# Patient Record
Sex: Male | Born: 1940 | Race: White | Hispanic: No | Marital: Married | Smoking: Former smoker
Health system: Southern US, Community
[De-identification: ages and names within clinical notes are randomized; demographics above are authoritative.]

## PROBLEM LIST (undated history)

## (undated) DIAGNOSIS — I2510 Atherosclerotic heart disease of native coronary artery without angina pectoris: Secondary | ICD-10-CM

## (undated) DIAGNOSIS — I219 Acute myocardial infarction, unspecified: Secondary | ICD-10-CM

## (undated) DIAGNOSIS — C9590 Leukemia, unspecified not having achieved remission: Secondary | ICD-10-CM

## (undated) DIAGNOSIS — E785 Hyperlipidemia, unspecified: Secondary | ICD-10-CM

## (undated) DIAGNOSIS — I10 Essential (primary) hypertension: Secondary | ICD-10-CM

## (undated) DIAGNOSIS — M47817 Spondylosis without myelopathy or radiculopathy, lumbosacral region: Secondary | ICD-10-CM

## (undated) DIAGNOSIS — E119 Type 2 diabetes mellitus without complications: Secondary | ICD-10-CM

## (undated) DIAGNOSIS — M1611 Unilateral primary osteoarthritis, right hip: Secondary | ICD-10-CM

## (undated) HISTORY — DX: Unilateral primary osteoarthritis, right hip: M16.11

## (undated) HISTORY — DX: Type 2 diabetes mellitus without complications: E11.9

## (undated) HISTORY — DX: Spondylosis without myelopathy or radiculopathy, lumbosacral region: M47.817

## (undated) HISTORY — PX: CORONARY ANGIOPLASTY WITH STENT PLACEMENT: SHX49

## (undated) NOTE — Progress Notes (Signed)
Formatting of this note is different from the original.  Images from the original note were not included.      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 He is doing okay. The lateral rectus muscle and paraesthesias have gotten worse since the previous visit. He did a 1 day burst of 120 mg of prednisone 6 days ago. No fevers, reports unintentional weight loss. Reports numbness in forehead that radiates down the left side of his face. Reports some discomfort in his hip but no numbness/tingling anywhere else.    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    PHYSICAL EXAM:   VS: There were no vitals taken for this visit.  ECOG: 2- Ambulatory and  capable of all selfcare; unable to carry out work activities.  Up and about > 50% of waking hrs.  GENERAL: Man who is awake, alert, no acute distress  PYSCH:  Alert and oriented.   HEENT: Anicteric sclerae. PERRL. Moist mucous membranes without lesions. Neck supple.  LYMPH NODES: There is no cervical, axillary, supraclavicular or inguinal lymphadenopathy.   HEART: Regular rate and rhythm, no murmurs, rubs, or gallops.  LUNGS: Clear to auscultation bilaterally, no wheezes or crackles.  ABDOMEN: +BS,  Soft, nondistended, nontender. No hepatosplenomegaly  MSK: Grossly intact ROM.   EXTREMITIES: No edema   NEURO: Paresis of left side of face and unable to adduct to left  SKIN: No rashes or other lesions.   IVAC: PIV    DATA REVIEWED:   Component Latest Ref Rng & Units 09/06/2021   WBC 3.70 - 11.00 k/uL 77.88 (H)   RBC 4.20 - 6.00 m/uL 3.10 (L)   Hemoglobin 13.0 - 17.0 g/dL 40.9 (L)   Hematocrit 81.1 - 51.0 % 33.1 (L)   MCV 80.0 - 100.0 fL 106.8 (H)   MCH 26.0 - 34.0 pg 33.9   MCHC 30.5 - 36.0 g/dL 91.4   RDW-CV 78.2 - 95.6 % 17.9 (H)   Platelet Count 150 - 400 k/uL 174   MPV 9.0 - 12.7 fL 10.5   NRBC /100 WBC 0.0   Absolute nRBC <0.01 k/uL <0.01   Neut% % 5.0   Abs Neut (ANC) 1.45 - 7.50 k/uL 3.89   Lymph% % 93.0   Abs Lymph 1.00 - 4.00 k/uL 72.43 (H)   Mono% % 2.0   Abs Mono <0.87 k/uL 1.56 (H)   Eosin% % 0.0   Abs Eosin <0.46 k/uL 0.00   Baso% % 0.0   Abs Baso <0.11 k/uL 0.00   Platelet Estimate  Adequate  Red Cell Morph  Reviewed: see results of individual morphologies   Anisocytosis  Present   Acanthocyte  Few   Ovalocytes  Few   DTYPE  Manual     Component Latest Ref Rng & Units 09/06/2021   Retic % 0.4 - 2.0 % 1.7   Abs Retic 0.018 - 0.100 M/uL 0.052     Component Latest Ref Rng & Units 09/06/2021   Iron 41 - 186 ug/dL 59   TIBC 161 - 096 ug/dL 045   Transferrin Saturation 15.0 - 57.0 % 17.7     ASSESSMENT AND PLAN:  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     #CLL/SLL  - BM biopsy 2003 confirming CLL/SLL (do not have records). Flow cytometry in Care Everywhere as noted above confirms diagnosis  - FISH in 2021 with 13q deletion, IVIG mutated  - Labs show progressive lymphocytosis and anemia  - May need to consider treatment in the future if anemia worsens     # Anemia  - Multifactorial, secondary to CLL as well as iron and folate deficiency  - Iron studies improved today, repeat folic acid in process  - May need to consider CLL directed therapy  soon    #Immunodeficiency/hypogammaglobulinemia  - Secondary to CLL causing severe hypogammaglobulinemia  - IgG 212 on 4/17  - Proceed with IVIG today     # Ramsay Hunt Syndrome  - He has diagnosed himself. MRI did show enhancement of the geniculate ganglion in December.  - Has not had further workup or consultation   - ?CLL related especially in light progressive CLL and timing  - Progressive neurologic symptoms today, we therefore discussed admission to the hospital for urgent evaluation including repeat MRI, ID consult and neurology consult    By signing my name below, I, [facility], attest that this documentation has been prepared under the direction and in the presence of Dr. Liliane Shi  Electronically signed,  Mcneil Sober, Scribe  Sep 06, 2021  11:28 AM    I, Lou Miner, MD, personally performed the services described in this documentation. All medical record entries made by the scribe were at my direction and in my presence. I have reviewed the chart and agree that the record reflects my personal performance and is accurate and complete.    Madlyn Frankel. Liliane Shi, MD  Associate Staff Physician  [facility]   [address]  [place], [place] [address]  Pager: [phone]  Phone: [phone]  Fax: [phone]    Electronically signed by Lou Miner, MD at 09/11/2021  3:49 PM EDT

## (undated) NOTE — Telephone Encounter (Signed)
Formatting of this note might be different from the original.  Call attempt made by [facility] Nurse, patient was reached.    Electronically signed by Quincy Simmonds, RN at 12/07/2021 11:35 AM EDT

---

## 2001-06-25 ENCOUNTER — Ambulatory Visit (HOSPITAL_COMMUNITY): Admission: RE | Admit: 2001-06-25 | Discharge: 2001-06-25 | Payer: Self-pay | Admitting: Family Medicine

## 2001-06-25 ENCOUNTER — Encounter: Payer: Self-pay | Admitting: Family Medicine

## 2001-11-12 ENCOUNTER — Encounter: Payer: Self-pay | Admitting: Cardiology

## 2008-03-03 ENCOUNTER — Ambulatory Visit (HOSPITAL_COMMUNITY): Admission: RE | Admit: 2008-03-03 | Discharge: 2008-03-03 | Payer: Self-pay | Admitting: Family Medicine

## 2010-05-27 ENCOUNTER — Encounter: Payer: Self-pay | Admitting: Family Medicine

## 2012-08-06 NOTE — Patient Instructions (Addendum)
MARKOS THEIL  08/06/2012   Your procedure is scheduled on:  08/10/12  [facility] to [facility] [place] [facility].  Call this number if you have problems the morning of surgery: [phone]   Remember:   Do not eat food or drink liquids after midnight.   Take these medicines the morning of surgery with A SIP OF WATER:verapamil, lisinopril, atenolol   Do not wear jewelry, make-up or nail polish.  Do not wear lotions, powders, or perfumes. You may wear deodorant.  Do not shave 48 hours prior to surgery. Men may shave face and neck.  Do not bring valuables to the hospital.  Contacts, dentures or bridgework may not be worn into surgery.  Leave suitcase in the car. After surgery it may be brought to your room.  For patients admitted to the hospital, checkout time is 11:00 AM the day of  discharge.   Patients discharged the day of surgery will not be allowed to drive  home.  Name and phone number of your driver: family  Special Instructions: Shower using CHG 2 nights before surgery and the night before surgery.  If you shower the day of surgery use CHG.  Use special wash - you have one bottle of CHG for all showers.  You should use approximately 1/3 of the bottle for each shower.   Please read over the following fact sheets that you were given: Pain Booklet, MRSA Information, Surgical Site Infection Prevention, Anesthesia Post-op Instructions and Care and Recovery After Surgery   PATIENT INSTRUCTIONS POST-ANESTHESIA  IMMEDIATELY FOLLOWING SURGERY:  Do not drive or operate machinery for the first twenty four hours after surgery.  Do not make any important decisions for twenty four hours after surgery or while taking narcotic pain medications or sedatives.  If you develop intractable nausea and vomiting or a severe headache please notify your doctor immediately.  FOLLOW-UP:  Please make an appointment with your surgeon as instructed. You do not need to follow up with anesthesia unless specifically instructed  to do so.  WOUND CARE INSTRUCTIONS (if applicable):  Keep a dry clean dressing on the anesthesia/puncture wound site if there is drainage.  Once the wound has quit draining you may leave it open to air.  Generally you should leave the bandage intact for twenty four hours unless there is drainage.  If the epidural site drains for more than 36-48 hours please call the anesthesia department.  QUESTIONS?:  Please feel free to call your physician or the hospital operator if you have any questions, and they will be happy to assist you.      Inguinal Hernia, Adult Muscles help keep everything in the body in its proper place. But if a weak spot in the muscles develops, something can poke through. That is called a hernia. When this happens in the lower part of the belly (abdomen), it is called an inguinal hernia. (It takes its name from a part of the body in this region called the inguinal canal.) A weak spot in the wall of muscles lets some fat or part of the small intestine bulge through. An inguinal hernia can develop at any age. Men get them more often than women. CAUSES  In adults, an inguinal hernia develops over time.  It can be triggered by:  Suddenly straining the muscles of the lower abdomen.  Lifting heavy objects.  Straining to have a bowel movement. Difficult bowel movements (constipation) can lead to this.  Constant coughing. This may be caused by  smoking or lung disease.  Being overweight.  Being pregnant.  Working at a job that requires long periods of standing or heavy lifting.  Having had an inguinal hernia before. One type can be an emergency situation. It is called a strangulated inguinal hernia. It develops if part of the small intestine slips through the weak spot and cannot get back into the abdomen. The blood supply can be cut off. If that happens, part of the intestine may die. This situation requires emergency surgery. SYMPTOMS  Often, a small inguinal hernia has no  symptoms. It is found when a healthcare provider does a physical exam. Larger hernias usually have symptoms.   In adults, symptoms may include:  A lump in the groin. This is easier to see when the person is standing. It might disappear when lying down.  In men, a lump in the scrotum.  Pain or burning in the groin. This occurs especially when lifting, straining or coughing.  A dull ache or feeling of pressure in the groin.  Signs of a strangulated hernia can include:  A bulge in the groin that becomes very painful and tender to the touch.  A bulge that turns red or purple.  Fever, nausea and vomiting.  Inability to have a bowel movement or to pass gas. DIAGNOSIS  To decide if you have an inguinal hernia, a healthcare provider will probably do a physical examination.  This will include asking questions about any symptoms you have noticed.  The healthcare provider might feel the groin area and ask you to cough. If an inguinal hernia is felt, the healthcare provider may try to slide it back into the abdomen.  Usually no other tests are needed. TREATMENT  Treatments can vary. The size of the hernia makes a difference. Options include:  Watchful waiting. This is often suggested if the hernia is small and you have had no symptoms.  No medical procedure will be done unless symptoms develop.  You will need to watch closely for symptoms. If any occur, contact your healthcare provider right away.  Surgery. This is used if the hernia is larger or you have symptoms.  Open surgery. This is usually an outpatient procedure (you will not stay overnight in a hospital). An cut (incision) is made through the skin in the groin. The hernia is put back inside the abdomen. The weak area in the muscles is then repaired by herniorrhaphy or hernioplasty. Herniorrhaphy: in this type of surgery, the weak muscles are sewn back together. Hernioplasty: a patch or mesh is used to close the weak area in the  abdominal wall.  Laparoscopy. In this procedure, a surgeon makes small incisions. A thin tube with a tiny video camera (called a laparoscope) is put into the abdomen. The surgeon repairs the hernia with mesh by looking with the video camera and using two long instruments. HOME CARE INSTRUCTIONS   After surgery to repair an inguinal hernia:  You will need to take pain medicine prescribed by your healthcare provider. Follow all directions carefully.  You will need to take care of the wound from the incision.  Your activity will be restricted for awhile. This will probably include no heavy lifting for several weeks. You also should not do anything too active for a few weeks. When you can return to work will depend on the type of job that you have.  During "watchful waiting" periods, you should:  Maintain a healthy weight.  Eat a diet high in fiber (fruits, vegetables  and whole grains).  Drink plenty of fluids to avoid constipation. This means drinking enough water and other liquids to keep your urine clear or pale yellow.  Do not lift heavy objects.  Do not stand for long periods of time.  Quit smoking. This should keep you from developing a frequent cough. SEEK MEDICAL CARE IF:   A bulge develops in your groin area.  You feel pain, a burning sensation or pressure in the groin. This might be worse if you are lifting or straining.  You develop a fever of more than 100.5 F (38.1 C). SEEK IMMEDIATE MEDICAL CARE IF:   Pain in the groin increases suddenly.  A bulge in the groin gets bigger suddenly and does not go down.  For men, there is sudden pain in the scrotum. Or, the size of the scrotum increases.  A bulge in the groin area becomes red or purple and is painful to touch.  You have nausea or vomiting that does not go away.  You feel your heart beating much faster than normal.  You cannot have a bowel movement or pass gas.  You develop a fever of more than 102.0 F  (38.9 C). Document Released: 09/08/2008 Document Revised: 07/15/2011 Document Reviewed: 09/08/2008 [facility] Patient Information 2013 [place], [place].

## 2012-08-06 NOTE — Consult Note (Signed)
NAME:  Mark Cross, Mark Cross NO.:  [id number]  MEDICAL RECORD NO.:  [id number]  LOCATION:                                 FACILITY:  PHYSICIAN:  Barbaraann Barthel, M.D. DATE OF BIRTH:  1940/06/01  DATE OF CONSULTATION:  08/05/2012 DATE OF DISCHARGE:                                CONSULTATION   NOTE:  Surgery was asked to see this 72 year old white male for bilateral inguinal hernia repair.  In essence, he has had these hernias for quite some time easily over greater than a year.  They have increased in size and caused him considerably more discomfort.  The hernia on the left side is really quite large and the hernia on the right side is moderate size, but rather large as well.  PAST MEDICAL HISTORY:  The patient is a type 2 diabetic, has history of hypertension and hypercholesterolemia.  He has had no previous surgery.  MEDICATIONS:  See medication list.  ALLERGIES:  He has no known allergies.  SOCIAL HISTORY:  He is a nonsmoker and nondrinker.  He used to work in a Medical laboratory scientific officer for many years.  PHYSICAL EXAMINATION:  VITAL SIGNS:  He is 5 feet 8 inches, weighs 157 pounds.  His temperature is 98.5, pulse rate is 70, and his respirations are 16 per minute, blood pressure 175/89. HEENT:  Head is normocephalic.  Eyes, extraocular movements are intact. Pupils were round and reactive to light and accommodation.  There is no conjunctive pallor or scleral injection.  Nose and oral mucosa are moist.  The patient has dental prosthesis superiorly and inferiorly. There are no bruits auscultated. NECK:  Supple and cylindrical without jugular vein distention, thyromegaly, or tracheal deviation, or any suspicious adenopathy. CHEST:  Clear. HEART:  Regular rhythm. ABDOMEN:  Somewhat globus.  No visceromegaly.  Abdomen is soft.  Bowel sounds are normoactive.  The patient has as mentioned a large left inguinal hernia and a moderate size and actually quite large right inguinal  hernia as well. RECTAL:  Stool is guaiac-negative.  The patient has a very firm prostate.  This is being followed by Dr. Sudie Bailey.  The patient states and he has recently had a PSA level. EXTREMITIES:  Within normal limits.  REVIEW OF SYSTEMS:  NEURO SYSTEM:  Grossly intact without any lateralizing symptoms.  No history of migraines or seizures.  ENDOCRINE HISTORY:  Diabetes type 2, no history of thyroid disease. CARDIOPULMONARY SYSTEM:  Nonsmoker, history of hypertension, and hypercholesterolemia. MUSCULOSKELETAL SYSTEM:  Normal for age with some arthritic complaints.  SKIN INTEGUMENT:  The patient has some signs of psoriasis, particularly intragluteal and on the elbows and scalp area and in the knees area.  GI SYSTEM:  No history of hepatitis, constipation, diarrhea, bright red rectal bleeding, black tarry stools, or history of inflammatory bowel disease, or irritable bowel syndrome, and no history of unexplained weight loss.  He is 72 years old and never had a colonoscopy.  GU SYSTEM:  Has past history of kidney stones.  No dysuria or frequency at present, and as stated, a firm prostate on examination.  REVIEW OF HISTORY AND PHYSICAL:  Therefore, Mr. Cossey is a 72 year old  man with bilateral inguinal hernias.  We will certainly repair of the left inguinal hernia and if this goes well, depending upon the type of anesthesia and after consultation with anesthesia, I made attempted to repair both of these at the same sitting.  However, I may only repair the left side first and have him returned.  This was discussed in detail with him along with complications not limited to, but including bleeding, infection, and recurrence.  I also addressed the importance of following up with a colonoscopy and following up his prostate status with him and he says he will do this with Dr. Sudie Bailey.     Barbaraann Barthel, M.D.     WB/MEDQ  D:  08/05/2012  T:  08/06/2012  Job:  [id number]  cc:    Mila Homer. Sudie Bailey, M.D. Fax: [phone]

## 2012-08-07 ENCOUNTER — Encounter (HOSPITAL_COMMUNITY)
Admission: RE | Admit: 2012-08-07 | Discharge: 2012-08-07 | Disposition: A | Discharge status: Home / Self Care | Payer: 59 | Source: Ambulatory Visit | Attending: General Surgery | Admitting: General Surgery

## 2012-08-07 ENCOUNTER — Encounter (HOSPITAL_COMMUNITY): Payer: Self-pay

## 2012-08-07 ENCOUNTER — Other Ambulatory Visit: Payer: Self-pay

## 2012-08-07 HISTORY — DX: Essential (primary) hypertension: I10

## 2012-08-07 HISTORY — DX: Type 2 diabetes mellitus without complications: E11.9

## 2012-08-07 HISTORY — DX: Hyperlipidemia, unspecified: E78.5

## 2012-08-07 LAB — DIFFERENTIAL
Basophils Absolute: 0 10*3/uL (ref 0.0–0.1)
Basophils Relative: 0 % (ref 0–1)
Lymphocytes Relative: 26 % (ref 12–46)
Neutro Abs: 4.9 10*3/uL (ref 1.7–7.7)
Neutrophils Relative %: 62 % (ref 43–77)

## 2012-08-07 LAB — CBC
HCT: 42.8 % (ref 39.0–52.0)
Hemoglobin: 14.7 g/dL (ref 13.0–17.0)
MCHC: 34.3 g/dL (ref 30.0–36.0)
RDW: 12.7 % (ref 11.5–15.5)
WBC: 7.8 10*3/uL (ref 4.0–10.5)

## 2012-08-07 LAB — BASIC METABOLIC PANEL
BUN: 22 mg/dL (ref 6–23)
Chloride: 101 mEq/L (ref 96–112)
Creatinine, Ser: 0.87 mg/dL (ref 0.50–1.35)
GFR calc Af Amer: >90 mL/min (ref >90)
GFR calc non Af Amer: 85 mL/min — ABNORMAL LOW (ref >90)
Potassium: 5.1 mEq/L (ref 3.5–5.1)

## 2012-08-07 LAB — SURGICAL PCR SCREEN: Staphylococcus aureus: NEGATIVE

## 2012-08-07 NOTE — OR Nursing (Signed)
Abnormal EKG reported and shown to Dr. Jayme Cloud

## 2012-08-10 ENCOUNTER — Encounter (HOSPITAL_COMMUNITY)
Admission: RE | Disposition: A | Discharge status: Home / Self Care | Payer: Self-pay | Source: Ambulatory Visit | Attending: General Surgery

## 2012-08-10 ENCOUNTER — Encounter (HOSPITAL_COMMUNITY): Payer: Self-pay | Admitting: *Deleted

## 2012-08-10 ENCOUNTER — Encounter (HOSPITAL_COMMUNITY): Payer: Self-pay | Admitting: Anesthesiology

## 2012-08-10 ENCOUNTER — Other Ambulatory Visit (HOSPITAL_COMMUNITY): Payer: Self-pay

## 2012-08-10 ENCOUNTER — Ambulatory Visit (HOSPITAL_COMMUNITY): Payer: 59 | Admitting: Anesthesiology

## 2012-08-10 ENCOUNTER — Ambulatory Visit (HOSPITAL_COMMUNITY)
Admission: RE | Admit: 2012-08-10 | Discharge: 2012-08-11 | Disposition: A | Discharge status: Home / Self Care | Payer: 59 | Source: Ambulatory Visit | Attending: General Surgery | Admitting: General Surgery

## 2012-08-10 DIAGNOSIS — K4090 Unilateral inguinal hernia, without obstruction or gangrene, not specified as recurrent: Secondary | ICD-10-CM

## 2012-08-10 DIAGNOSIS — K4020 Bilateral inguinal hernia, without obstruction or gangrene, not specified as recurrent: Secondary | ICD-10-CM | POA: Insufficient documentation

## 2012-08-10 DIAGNOSIS — E119 Type 2 diabetes mellitus without complications: Secondary | ICD-10-CM | POA: Insufficient documentation

## 2012-08-10 DIAGNOSIS — I10 Essential (primary) hypertension: Secondary | ICD-10-CM | POA: Insufficient documentation

## 2012-08-10 DIAGNOSIS — Z01812 Encounter for preprocedural laboratory examination: Secondary | ICD-10-CM | POA: Insufficient documentation

## 2012-08-10 DIAGNOSIS — Z01810 Encounter for preprocedural cardiovascular examination: Secondary | ICD-10-CM | POA: Insufficient documentation

## 2012-08-10 HISTORY — PX: INGUINAL HERNIA REPAIR: SHX194

## 2012-08-10 LAB — GLUCOSE, CAPILLARY
Glucose-Capillary: 181 mg/dL — ABNORMAL HIGH (ref 70–99)
Glucose-Capillary: 159 mg/dL — ABNORMAL HIGH (ref 70–99)

## 2012-08-10 LAB — URINALYSIS, ROUTINE W REFLEX MICROSCOPIC
Glucose, UA: NEGATIVE mg/dL
Ketones, ur: NEGATIVE mg/dL
Protein, ur: NEGATIVE mg/dL

## 2012-08-10 LAB — URINE MICROSCOPIC-ADD ON

## 2012-08-10 SURGERY — REPAIR, HERNIA, INGUINAL, ADULT
Anesthesia: General | Site: Groin | Laterality: Bilateral | Wound class: Clean

## 2012-08-10 MED ORDER — CEFAZOLIN SODIUM-DEXTROSE 2-3 GM-% IV SOLR
INTRAVENOUS | Status: AC
  Filled 2012-08-10: qty 50

## 2012-08-10 MED ORDER — PROPOFOL 10 MG/ML IV EMUL
INTRAVENOUS | Status: DC | PRN
  Administered 2012-08-10: 50 mg via INTRAVENOUS
  Administered 2012-08-10: 150 mg via INTRAVENOUS

## 2012-08-10 MED ORDER — POTASSIUM CHLORIDE IN NACL 20-0.9 MEQ/L-% IV SOLN
INTRAVENOUS | Status: DC
  Administered 2012-08-10 – 2012-08-11 (×2): via INTRAVENOUS

## 2012-08-10 MED ORDER — BUPIVACAINE HCL (PF) 0.5 % IJ SOLN
INTRAMUSCULAR | Status: DC | PRN
  Administered 2012-08-10: 9 mL
  Administered 2012-08-10: 10 mL

## 2012-08-10 MED ORDER — LIDOCAINE HCL (PF) 1 % IJ SOLN
INTRAMUSCULAR | Status: AC
  Filled 2012-08-10: qty 5

## 2012-08-10 MED ORDER — LACTATED RINGERS IV SOLN
INTRAVENOUS | Status: AC
  Filled 2012-08-10: qty 1000

## 2012-08-10 MED ORDER — ONDANSETRON HCL 4 MG/2ML IJ SOLN
4.0000 mg | Freq: Once | INTRAMUSCULAR | Status: AC
  Administered 2012-08-10: 4 mg via INTRAVENOUS

## 2012-08-10 MED ORDER — GLYCOPYRROLATE 0.2 MG/ML IJ SOLN
INTRAMUSCULAR | Status: AC
  Filled 2012-08-10: qty 1

## 2012-08-10 MED ORDER — FENTANYL CITRATE 0.05 MG/ML IJ SOLN
INTRAMUSCULAR | Status: DC | PRN
  Administered 2012-08-10 (×2): 50 ug via INTRAVENOUS

## 2012-08-10 MED ORDER — BUPIVACAINE HCL (PF) 0.5 % IJ SOLN
INTRAMUSCULAR | Status: AC
  Filled 2012-08-10: qty 30

## 2012-08-10 MED ORDER — MIDAZOLAM HCL 2 MG/2ML IJ SOLN
INTRAMUSCULAR | Status: AC
  Filled 2012-08-10: qty 2

## 2012-08-10 MED ORDER — ONDANSETRON HCL 4 MG PO TABS: 4.0000 mg | ORAL_TABLET | Freq: Four times a day (QID) | ORAL | Status: DC | PRN

## 2012-08-10 MED ORDER — VERAPAMIL HCL ER 180 MG PO TBCR
360.0000 mg | EXTENDED_RELEASE_TABLET | Freq: Every day | ORAL | Status: DC
  Administered 2012-08-10: 360 mg via ORAL
  Filled 2012-08-10: qty 2

## 2012-08-10 MED ORDER — STERILE WATER FOR IRRIGATION IR SOLN
Status: DC | PRN
  Administered 2012-08-10: 2000 mL

## 2012-08-10 MED ORDER — ATENOLOL 25 MG PO TABS
50.0000 mg | ORAL_TABLET | Freq: Every day | ORAL | Status: DC
  Administered 2012-08-11: 50 mg via ORAL
  Filled 2012-08-10 (×2): qty 2

## 2012-08-10 MED ORDER — MIDAZOLAM HCL 2 MG/2ML IJ SOLN
1.0000 mg | INTRAMUSCULAR | Status: DC | PRN
  Administered 2012-08-10: 2 mg via INTRAVENOUS

## 2012-08-10 MED ORDER — ONDANSETRON HCL 4 MG/2ML IJ SOLN
INTRAMUSCULAR | Status: AC
  Filled 2012-08-10: qty 2

## 2012-08-10 MED ORDER — SODIUM CHLORIDE 0.9 % IR SOLN
Status: DC | PRN
  Administered 2012-08-10: 1000 mL

## 2012-08-10 MED ORDER — SIMVASTATIN 20 MG PO TABS
40.0000 mg | ORAL_TABLET | Freq: Every evening | ORAL | Status: DC
  Administered 2012-08-10: 40 mg via ORAL
  Filled 2012-08-10: qty 2

## 2012-08-10 MED ORDER — GLYCOPYRROLATE 0.2 MG/ML IJ SOLN
INTRAMUSCULAR | Status: DC | PRN
  Administered 2012-08-10: 0.2 mg via INTRAVENOUS

## 2012-08-10 MED ORDER — DOCUSATE SODIUM 100 MG PO CAPS
100.0000 mg | ORAL_CAPSULE | Freq: Every day | ORAL | Status: DC
  Administered 2012-08-10 – 2012-08-11 (×2): 100 mg via ORAL
  Filled 2012-08-10 (×2): qty 1

## 2012-08-10 MED ORDER — HYDROCHLOROTHIAZIDE 25 MG PO TABS
50.0000 mg | ORAL_TABLET | Freq: Every day | ORAL | Status: DC
  Administered 2012-08-11: 50 mg via ORAL
  Filled 2012-08-10 (×2): qty 2

## 2012-08-10 MED ORDER — LIDOCAINE HCL (CARDIAC) 20 MG/ML IV SOLN
INTRAVENOUS | Status: DC | PRN
  Administered 2012-08-10: 50 mg via INTRAVENOUS

## 2012-08-10 MED ORDER — METFORMIN HCL 500 MG PO TABS
1000.0000 mg | ORAL_TABLET | Freq: Two times a day (BID) | ORAL | Status: AC
  Administered 2012-08-10 – 2012-08-11 (×2): 1000 mg via ORAL
  Filled 2012-08-10 (×2): qty 2

## 2012-08-10 MED ORDER — LISINOPRIL 10 MG PO TABS
10.0000 mg | ORAL_TABLET | Freq: Every day | ORAL | Status: DC
  Administered 2012-08-11: 10 mg via ORAL
  Filled 2012-08-10 (×2): qty 1

## 2012-08-10 MED ORDER — PROPOFOL 10 MG/ML IV EMUL
INTRAVENOUS | Status: AC
  Filled 2012-08-10: qty 20

## 2012-08-10 MED ORDER — FENTANYL CITRATE 0.05 MG/ML IJ SOLN
INTRAMUSCULAR | Status: AC
  Filled 2012-08-10: qty 2

## 2012-08-10 MED ORDER — LACTATED RINGERS IV SOLN
INTRAVENOUS | Status: DC | PRN
  Administered 2012-08-10 (×2): via INTRAVENOUS

## 2012-08-10 MED ORDER — EPHEDRINE SULFATE 50 MG/ML IJ SOLN
INTRAMUSCULAR | Status: DC | PRN
  Administered 2012-08-10 (×2): 5 mg via INTRAVENOUS
  Administered 2012-08-10 (×4): 10 mg via INTRAVENOUS

## 2012-08-10 MED ORDER — EPHEDRINE SULFATE 50 MG/ML IJ SOLN
INTRAMUSCULAR | Status: AC
  Filled 2012-08-10: qty 1

## 2012-08-10 MED ORDER — ONDANSETRON HCL 4 MG/2ML IJ SOLN: 4.0000 mg | Freq: Four times a day (QID) | INTRAMUSCULAR | Status: DC | PRN

## 2012-08-10 MED ORDER — MORPHINE SULFATE 2 MG/ML IJ SOLN
1.0000 mg | INTRAMUSCULAR | Status: DC | PRN
  Administered 2012-08-10 – 2012-08-11 (×6): 1 mg via INTRAVENOUS
  Filled 2012-08-10 (×6): qty 1

## 2012-08-10 MED ORDER — LACTATED RINGERS IV SOLN
INTRAVENOUS | Status: DC
Start: 2012-08-10 — End: 2012-08-10

## 2012-08-10 MED ORDER — CEFAZOLIN SODIUM-DEXTROSE 2-3 GM-% IV SOLR
2.0000 g | Freq: Once | INTRAVENOUS | Status: AC
  Administered 2012-08-10: 2 g via INTRAVENOUS

## 2012-08-10 MED ORDER — FENTANYL CITRATE 0.05 MG/ML IJ SOLN: 25.0000 ug | INTRAMUSCULAR | Status: DC | PRN

## 2012-08-10 MED ORDER — ONDANSETRON HCL 4 MG/2ML IJ SOLN: 4.0000 mg | Freq: Once | INTRAMUSCULAR | Status: DC | PRN

## 2012-08-10 SURGICAL SUPPLY — 50 items
ATTRACTOMAT 16X20 MAGNETIC DRP (DRAPES) ×2 IMPLANT
BAG HAMPER (MISCELLANEOUS) ×2 IMPLANT
BLADE KNIFE PERSONA 15 (BLADE) ×2 IMPLANT
BLADE SURG SZ10 CARB STEEL (BLADE) ×2 IMPLANT
CLOTH BEACON ORANGE TIMEOUT ST (SAFETY) ×2 IMPLANT
COVER LIGHT HANDLE STERIS (MISCELLANEOUS) ×4 IMPLANT
DECANTER SPIKE VIAL GLASS SM (MISCELLANEOUS) ×2 IMPLANT
DRAIN PENROSE 12X.25 LTX STRL (MISCELLANEOUS) ×2 IMPLANT
DRSG MEPILEX BORDER 4X8 (GAUZE/BANDAGES/DRESSINGS) IMPLANT
DRSG TEGADERM 2-3/8X2-3/4 SM (GAUZE/BANDAGES/DRESSINGS) ×2 IMPLANT
DRSG TEGADERM 4X4.75 (GAUZE/BANDAGES/DRESSINGS) ×4 IMPLANT
ELECT REM PT RETURN 9FT ADLT (ELECTROSURGICAL) ×2
ELECTRODE REM PT RTRN 9FT ADLT (ELECTROSURGICAL) ×1 IMPLANT
FORMALIN 10 PREFIL 120ML (MISCELLANEOUS) ×2 IMPLANT
GLOVE BIO SURGEON STRL SZ7 (GLOVE) ×2 IMPLANT
GLOVE BIOGEL PI IND STRL 7.0 (GLOVE) ×5 IMPLANT
GLOVE BIOGEL PI IND STRL 8 (GLOVE) ×1 IMPLANT
GLOVE BIOGEL PI INDICATOR 7.0 (GLOVE) ×5
GLOVE BIOGEL PI INDICATOR 8 (GLOVE) ×1
GLOVE ECLIPSE 6.5 STRL STRAW (GLOVE) ×4 IMPLANT
GLOVE SKINSENSE NS SZ7.0 (GLOVE) ×2
GLOVE SKINSENSE STRL SZ7.0 (GLOVE) ×2 IMPLANT
GLOVE SS BIOGEL STRL SZ 6.5 (GLOVE) ×5 IMPLANT
GLOVE SUPERSENSE BIOGEL SZ 6.5 (GLOVE) ×5
GOWN STRL REIN XL XLG (GOWN DISPOSABLE) ×10 IMPLANT
INST SET MINOR GENERAL (KITS) ×2 IMPLANT
KIT ROOM TURNOVER APOR (KITS) ×2 IMPLANT
MANIFOLD NEPTUNE II (INSTRUMENTS) ×2 IMPLANT
NEEDLE HYPO 25X1 1.5 SAFETY (NEEDLE) IMPLANT
NS IRRIG 1000ML POUR BTL (IV SOLUTION) ×2 IMPLANT
PACK MINOR (CUSTOM PROCEDURE TRAY) ×2 IMPLANT
PAD ARMBOARD 7.5X6 YLW CONV (MISCELLANEOUS) ×2 IMPLANT
SET BASIN LINEN APH (SET/KITS/TRAYS/PACK) ×2 IMPLANT
SOL PREP PROV IODINE SCRUB 4OZ (MISCELLANEOUS) ×2 IMPLANT
SPONGE GAUZE 4X4 12PLY (GAUZE/BANDAGES/DRESSINGS) ×4 IMPLANT
SPONGE INTESTINAL PEANUT (DISPOSABLE) ×2 IMPLANT
SPONGE LAP 18X18 X RAY DECT (DISPOSABLE) ×4 IMPLANT
STAPLER VISISTAT 35W (STAPLE) ×2 IMPLANT
SUT NOVA NAB GS-22 2 2-0 T-19 (SUTURE) IMPLANT
SUT NUROLON NAB CT 2 2-0 18IN (SUTURE) ×6 IMPLANT
SUT PROLENE 0 CT 1 CR/8 (SUTURE) IMPLANT
SUT SILK 2 0 (SUTURE) ×1
SUT SILK 2-0 18XBRD TIE 12 (SUTURE) ×1 IMPLANT
SUT VIC AB 3-0 SH 27 (SUTURE) ×2
SUT VIC AB 3-0 SH 27X BRD (SUTURE) ×2 IMPLANT
SUT VICRYL AB 3 0 TIES (SUTURE) ×2 IMPLANT
SYR BULB IRRIGATION 50ML (SYRINGE) ×2 IMPLANT
SYR CONTROL 10ML LL (SYRINGE) ×2 IMPLANT
TRAY FOLEY CATH 14FR (SET/KITS/TRAYS/PACK) ×2 IMPLANT
WATER STERILE IRR 1000ML POUR (IV SOLUTION) ×4 IMPLANT

## 2012-08-10 NOTE — Brief Op Note (Signed)
08/10/2012  9:48 AM  PATIENT:  Milus Mallick  72 y.o. male  PRE-OPERATIVE DIAGNOSIS:  bilateral inguinal hernias   POST-OPERATIVE DIAGNOSIS:  bilateral direct inguinal hernias  PROCEDURE:  Procedure(s): HERNIA REPAIR INGUINAL ADULT (Bilateral)  SURGEON:  Surgeon(s) and Role:    * Marlane Hatcher, MD - Primary  PHYSICIAN ASSISTANT:   ASSISTANTS: none   ANESTHESIA:   general  EBL:  Total I/O In: 1500 [I.V.:1500] Out: 165 [Urine:125; Blood:40]  BLOOD ADMINISTERED:none  DRAINS: none   LOCAL MEDICATIONS USED:  MARCAINE   0.5% 9cc Left groin, 10 cc Right groin.  SPECIMEN:  No Specimen  DISPOSITION OF SPECIMEN:  N/A  COUNTS:  YES  TOURNIQUET:  * No tourniquets in log *  DICTATION: .Other Dictation: Dictation Number OR dict # [id number].  PLAN OF CARE: Admit for overnight observation  PATIENT DISPOSITION:  PACU - hemodynamically stable.   Delay start of Pharmacological VTE agent (>24hrs) due to surgical blood loss or risk of bleeding: not applicable

## 2012-08-10 NOTE — Progress Notes (Signed)
Post OP Check  Awake and alert.  Wounds clean and dry withy minimal swelling. Will D/C foley in AM.  Filed Vitals:   08/10/12 1430  BP: 151/72  Pulse: 65  Temp: 98.1 F (36.7 C)  Resp: 18    Doing well post op.

## 2012-08-10 NOTE — Transfer of Care (Signed)
Immediate Anesthesia Transfer of Care Note  Patient: Mark Cross  Procedure(s) Performed: Procedure(s): HERNIA REPAIR INGUINAL ADULT (Bilateral)  Patient Location: PACU  Anesthesia Type:General  Level of Consciousness: awake, alert  and oriented  Airway & Oxygen Therapy: Patient Spontanous Breathing and Patient connected to nasal cannula oxygen  Post-op Assessment: Report given to PACU RN and Post -op Vital signs reviewed and stable  Post vital signs: Reviewed and stable  Complications: No apparent anesthesia complications

## 2012-08-10 NOTE — Anesthesia Postprocedure Evaluation (Addendum)
  Anesthesia Post-op Note  Patient: Mark Cross  Procedure(s) Performed: Procedure(s): HERNIA REPAIR INGUINAL ADULT (Bilateral)  Patient Location: PACU  Anesthesia Type:General  Level of Consciousness: awake, alert  and oriented  Airway and Oxygen Therapy: Patient Spontanous Breathing and Patient connected to nasal cannula oxygen  Post-op Pain: none  Post-op Assessment: Post-op Vital signs reviewed, Patient's Cardiovascular Status Stable, Respiratory Function Stable, Patent Airway, No signs of Nausea or vomiting, Adequate PO intake, Pain level controlled, No headache, No backache, No residual numbness and No residual motor weakness  Post-op Vital Signs: Reviewed and stable  Complications: No apparent anesthesia complications 08/11/12  Patient doing well, VSS.  No apparent anesthesia complications.

## 2012-08-10 NOTE — Addendum Note (Signed)
Addendum created 08/10/12 1032 by Laurene Footman, MD   Modules edited: Anesthesia Review and Sign Navigator Section

## 2012-08-10 NOTE — Progress Notes (Signed)
36 yr. Old W. Male for repair of inguinal hernias.  Will repair Left side first and if prudent will repair Right side as well.  Procedure and risks explained and informed consent obtained.  Labs reviewed. BS 180. Left side surgical site marked.  No clinical change in H&P dict. # [id number].  Filed Vitals:   08/10/12 0715  BP: 162/78  Temp:   Resp: 16  temp.97.5

## 2012-08-10 NOTE — Anesthesia Preprocedure Evaluation (Signed)
Anesthesia Evaluation  Patient identified by MRN, date of birth, ID band Patient awake    Reviewed: Allergy & Precautions, H&P , NPO status , Patient's Chart, lab work & pertinent test results, reviewed documented beta blocker date and time   Airway Mallampati: I TM Distance: >3 FB Neck ROM: Full    Dental  (+) Edentulous Upper and Edentulous Lower   Pulmonary neg pulmonary ROS, former smoker,  breath sounds clear to auscultation        Cardiovascular hypertension, Pt. on medications Rhythm:Regular Rate:Normal     Neuro/Psych    GI/Hepatic negative GI ROS,   Endo/Other  diabetes, Well Controlled, Type 2, Oral Hypoglycemic Agents  Renal/GU      Musculoskeletal   Abdominal   Peds  Hematology   Anesthesia Other Findings   Reproductive/Obstetrics                           Anesthesia Physical Anesthesia Plan  ASA: III  Anesthesia Plan: General   Post-op Pain Management:    Induction: Intravenous  Airway Management Planned: LMA  Additional Equipment:   Intra-op Plan:   Post-operative Plan: Extubation in OR  Informed Consent: I have reviewed the patients History and Physical, chart, labs and discussed the procedure including the risks, benefits and alternatives for the proposed anesthesia with the patient or authorized representative who has indicated his/her understanding and acceptance.     Plan Discussed with:   Anesthesia Plan Comments:         Anesthesia Quick Evaluation

## 2012-08-10 NOTE — Preoperative (Signed)
Beta Blockers   Reason not to administer Beta Blockers:Not Applicable 

## 2012-08-11 MED ORDER — DOCUSATE SODIUM 100 MG PO CAPS: 100.0000 mg | ORAL_CAPSULE | Freq: Two times a day (BID) | ORAL | Status: DC

## 2012-08-11 MED ORDER — SULFAMETHOXAZOLE-TMP DS 800-160 MG PO TABS: 1.0000 | ORAL_TABLET | Freq: Two times a day (BID) | ORAL | Status: DC

## 2012-08-11 MED ORDER — DSS 100 MG PO CAPS: 100.0000 mg | ORAL_CAPSULE | Freq: Every day | ORAL | Status: DC

## 2012-08-11 NOTE — Progress Notes (Signed)
POD 1  Pt voiding well without foley but had very foul smelling urine with som WBC's.  Will discharge on Bactrim.  Wound clean and redressed.  Has some penile swelling and ecchymosis as expected after surgery but has very tolerable comfort level.  Will discharge on tylenol and ice packs.   Doing very well, discharge and follow up arranged.  Filed Vitals:   08/11/12 0700  BP: 165/70  Pulse: 80  Temp: 98.2 F (36.8 C)  Resp: 20   Discharge note dictated.  # [id number].

## 2012-08-11 NOTE — Progress Notes (Signed)
UR chart review completed.  

## 2012-08-11 NOTE — Progress Notes (Signed)
Urinary foley catheter removed at 6:30 am. Tolerated well by patient. Wife at bedside.

## 2012-08-11 NOTE — Op Note (Signed)
NAME:  TANDRE, CONLY NO.:  [id number]  MEDICAL RECORD NO.:  [id number]  LOCATION:  A317                          FACILITY:  [facility]  PHYSICIAN:  Barbaraann Barthel, M.D. DATE OF BIRTH:  Aug 12, 1940  DATE OF PROCEDURE:  08/10/2012 DATE OF DISCHARGE:                              OPERATIVE REPORT   PROCEDURE:  Bilateral inguinal hernia repair (modified McVay repair without the use of mesh).  WOUND CLASSIFICATION:  Clean.  SPECIMEN:  None.  NOTE:  This is a 72 year old white male, who was referred by Dr. Sudie Bailey with bilateral inguinal hernias.  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  I thought that we may be able to include to take care of both of these at the same time as he had no difficulty ambulating and in my opinion it would minimize the number of anesthesia risks, etc.  We discussed the possibility that I may have to repair just one and return later and repair the other one. We also discussed other complications not limited to, but including bleeding, infection, and recurrence.  Informed consent was obtained.  GROSS OPERATIVE FINDINGS:  The patient had large bilateral direct hernias, larger hernia on the left side.  TECHNIQUE:  The patient was placed in the supine position. After the placement of LMA general anesthesia, a Foley catheter was aseptically inserted and he was prepped with Betadine solution and draped in usual manner.  An incision was carried out on the left side first between the anterior-superior iliac spine and the pubic tubercle through skin, subcutaneous tissue, and Scarpa's layer down to the external oblique, which was opened through the external ring.  There was a very large immediately visible direct hernia.  No indirect hernia was found on the cord structures, which were dissected free and isolated.  We then sutured the  fascia transversalis and transversus abdominis fascia to Cooper's ligament and Poupart ligament with interrupted 2-0 Bralon sutures which we tightened after relaxing incision was carried out.  I then chose about 9 mL 0.5% Sensorcaine without epinephrine on the left side for postoperative comfort.  We then returned the cord structures to their anatomic position below the external oblique, which we repaired with a running 3-0 Polysorb suture.  The subcu was irrigated.  The skin was approximated with stapling device and a sterile dressing with 4 x 4 and OpSite type dressing was applied.  We changed gloves and attention was then turned to the right side where again incision was carried out between the anterior-superior iliac spine and pubic tubercle through skin, subcutaneous tissue, and Scarpa's layer down to the external oblique, which was opened through the skin.  Again another direct hernia was clearly visible.  This one was less large but still pretty good size which we repaired in the same manner, suturing transversus abdominis and transversalis fascia to Cooper ligament and Poupart ligament with interrupted 2-0 Bralon sutures.  A relaxing incision was carried out prior to cinching these tightly and again I used about 10 mL of 0.5% Sensorcaine without  epinephrine to help with postoperative comfort.  The wound was irrigated.  The external oblique was repaired over the cord structures and the skin was approximated with stapling device and a 4 x 4 and OpSite dressing was applied.  I decided to leave the Foley catheter in until he was little more ambulatory.  I will remove this on the first postoperative day.  He will be admitted for prolonged outpatient observation and will be discharged likely tomorrow.  Prior to closure, all sponge, needle, and instrument counts were found to be correct.  Estimated blood loss was minimal.  The patient received less than 25 mL. The patient received 1400 mL  of crystalloids intraoperatively.  Operating time was about an hour and half.  There were no complications.     Barbaraann Barthel, M.D.     WB/MEDQ  D:  08/10/2012  T:  08/11/2012  Job:  [id number]  cc:   Mila Homer. Sudie Bailey, M.D. Fax: [phone]

## 2012-08-11 NOTE — Progress Notes (Signed)
Pt and his wife and daughter verbalize understanding of d/c instructions, follow up appts, new medications, and meds to avoid. All questions answered. Pt and wife verbalize understanding of wound care, and they observed Dr. Malvin Johns care for the wound. Wound supplies given to pt, per Dr. Bertram Savin request. IV d/c. Pt d/c via wheelchair, by NT, accompanied by his family. Sheryn Bison

## 2012-08-11 NOTE — Plan of Care (Signed)
Problem: Phase I Progression Outcomes Goal: Voiding-avoid urinary catheter unless indicated Outcome: Completed/Met Date Met:  08/11/12 Foley dc

## 2012-08-12 NOTE — Discharge Summary (Signed)
NAME:  NEELY, CECENA NO.:  [id number]  MEDICAL RECORD NO.:  [id number]  LOCATION:  A317                          FACILITY:  [facility]  PHYSICIAN:  Barbaraann Barthel, M.D. DATE OF BIRTH:  [birth year]  DATE OF ADMISSION:  08/10/2012 DATE OF DISCHARGE:  04/08/2014LH                              DISCHARGE SUMMARY   DIAGNOSIS:  Bilateral inguinal hernia.  SECONDARY DIAGNOSIS:  Diabetes type 2.  COMPLICATION:  Possible urinary tract infection.  NOTE:  This is a 72 year old white male who had large bilateral inguinal hernias.  He was a TEFL teacher and I thought that he would tolerate bilateral repair at the same sitting, which would obviously benefit him in minimize his surgical risks.  He did well postoperatively.  He had some penile and scrotal ecchymosis and swelling, which is to be expected; otherwise, he did quite well, had minimal discomfort.  We did note that his urine was little foul smelling and he had some white blood cells noted in it and so I placed him on some Bactrim as a discharge medication.  Otherwise, his pain was quite tolerable and he should do well on Tylenol and ice packs.  Wound Care was explained to him and he was given the appropriate instructions and restrictions.  We will follow up with him in my office in 1 week's time.  He is told to call if are any acute changes.     Barbaraann Barthel, M.D.     WB/MEDQ  D:  08/11/2012  T:  08/12/2012  Job:  [id number]  cc:   Mila Homer. Sudie Bailey, M.D. Fax: [phone]

## 2012-08-13 ENCOUNTER — Encounter (HOSPITAL_COMMUNITY): Payer: Self-pay | Admitting: General Surgery

## 2015-12-29 ENCOUNTER — Other Ambulatory Visit: Payer: Self-pay

## 2016-02-09 ENCOUNTER — Other Ambulatory Visit (HOSPITAL_COMMUNITY): Payer: Self-pay | Admitting: Family Medicine

## 2016-02-09 ENCOUNTER — Ambulatory Visit (HOSPITAL_COMMUNITY)
Admission: RE | Admit: 2016-02-09 | Discharge: 2016-02-09 | Disposition: A | Discharge status: Home / Self Care | Payer: 59 | Source: Ambulatory Visit | Attending: Family Medicine | Admitting: Family Medicine

## 2016-02-09 DIAGNOSIS — M545 Low back pain: Secondary | ICD-10-CM | POA: Diagnosis present

## 2016-02-09 DIAGNOSIS — R935 Abnormal findings on diagnostic imaging of other abdominal regions, including retroperitoneum: Secondary | ICD-10-CM | POA: Insufficient documentation

## 2016-02-09 DIAGNOSIS — I700 Atherosclerosis of aorta: Secondary | ICD-10-CM | POA: Diagnosis not present

## 2016-02-09 DIAGNOSIS — M5136 Other intervertebral disc degeneration, lumbar region: Secondary | ICD-10-CM | POA: Insufficient documentation

## 2016-02-09 DIAGNOSIS — M25551 Pain in right hip: Secondary | ICD-10-CM

## 2016-02-09 DIAGNOSIS — M4186 Other forms of scoliosis, lumbar region: Secondary | ICD-10-CM | POA: Diagnosis not present

## 2016-02-09 DIAGNOSIS — M160 Bilateral primary osteoarthritis of hip: Secondary | ICD-10-CM | POA: Diagnosis not present

## 2018-01-02 LAB — UNMAPPED LAB RESULTS
Hematocrit (HT): 38.1 % — ABNORMAL LOW (ref 40.0–52.0)
Hemoglobin (HGB) (HT): 12.8 g/dL — ABNORMAL LOW (ref 13.0–17.5)
MCHC (HT): 33.6 g/dL (ref 32.0–36.0)
MCV (HT): 88.8 FL (ref 81.0–99.0)
Mean Corpuscular Hemoglobin (MCH) (HT): 29.8 pg (ref 26.0–34.0)
Platelets (HT): 167 10 3/uL (ref 140–400)
RBC (HT): 4.29 10 6/uL (ref 4.20–5.90)
RDW (HT): 12.2 % (ref 11.5–15.0)
WBC (HT): 16 10 3/uL — ABNORMAL HIGH (ref 4.0–10.8)

## 2018-03-10 LAB — UNMAPPED LAB RESULTS
Basophil # (HT): 0.1 10 3/uL (ref 0.0–0.2)
Basophil % (HT): 0.4 % (ref 0.0–1.8)
Eosinophil # (HT): 0.1 10 3/uL (ref 0.0–0.5)
Eosinophil % (HT): 0.5 % (ref 0.0–7.0)
Hematocrit (HT): 39.4 % — ABNORMAL LOW (ref 40.0–52.0)
Hemoglobin (HGB) (HT): 13.2 g/dL (ref 13.0–17.5)
Lymphocyte # (HT): 12.4 10 3/uL — ABNORMAL HIGH (ref 0.9–3.8)
Lymphocyte % (HT): 67.1 % — ABNORMAL HIGH (ref 17.0–44.0)
MCHC (HT): 33.5 g/dL (ref 32.0–36.0)
MCV (HT): 87.8 FL (ref 81.0–99.0)
Mean Corpuscular Hemoglobin (MCH) (HT): 29.4 pg (ref 26.0–34.0)
Monocyte # (HT): 1.9 10 3/uL — ABNORMAL HIGH (ref 0.2–1.0)
Monocyte % (HT): 10.5 % (ref 4.0–12.0)
Neutrophil # (HT): 3.9 10 3/uL (ref 1.5–7.7)
Platelets (HT): 169 10 3/uL (ref 140–400)
RBC (HT): 4.49 10 6/uL (ref 4.20–5.90)
RDW (HT): 12.4 % (ref 11.5–15.0)
Seg Neut % (HT): 21.5 % — ABNORMAL LOW (ref 40.0–75.0)
WBC (HT): 18.4 10 3/uL — ABNORMAL HIGH (ref 4.0–10.8)

## 2018-09-14 ENCOUNTER — Other Ambulatory Visit: Payer: Self-pay | Admitting: Family Medicine

## 2018-09-14 ENCOUNTER — Other Ambulatory Visit (HOSPITAL_COMMUNITY): Payer: Self-pay | Admitting: Family Medicine

## 2018-09-14 DIAGNOSIS — M5441 Lumbago with sciatica, right side: Secondary | ICD-10-CM

## 2018-09-16 ENCOUNTER — Ambulatory Visit (HOSPITAL_COMMUNITY)
Admission: RE | Admit: 2018-09-16 | Discharge: 2018-09-16 | Disposition: A | Discharge status: Home / Self Care | Payer: Medicare Other | Source: Ambulatory Visit | Attending: Family Medicine | Admitting: Family Medicine

## 2018-09-16 ENCOUNTER — Other Ambulatory Visit: Payer: Self-pay

## 2018-09-16 DIAGNOSIS — M5441 Lumbago with sciatica, right side: Secondary | ICD-10-CM

## 2018-10-01 ENCOUNTER — Other Ambulatory Visit: Payer: Self-pay | Admitting: Family Medicine

## 2018-10-01 DIAGNOSIS — M545 Low back pain, unspecified: Secondary | ICD-10-CM

## 2018-10-01 DIAGNOSIS — G8929 Other chronic pain: Secondary | ICD-10-CM

## 2018-10-15 ENCOUNTER — Other Ambulatory Visit: Payer: Self-pay

## 2018-10-15 ENCOUNTER — Ambulatory Visit
Admission: RE | Admit: 2018-10-15 | Discharge: 2018-10-15 | Disposition: A | Discharge status: Home / Self Care | Payer: Medicare Other | Source: Ambulatory Visit | Attending: Family Medicine | Admitting: Family Medicine

## 2018-10-15 DIAGNOSIS — M545 Low back pain, unspecified: Secondary | ICD-10-CM

## 2018-10-15 DIAGNOSIS — G8929 Other chronic pain: Secondary | ICD-10-CM

## 2018-10-15 MED ORDER — IOPAMIDOL (ISOVUE-M 200) INJECTION 41%
1.0000 mL | Freq: Once | INTRAMUSCULAR | Status: AC
  Administered 2018-10-15: 1 mL via EPIDURAL

## 2018-10-15 MED ORDER — METHYLPREDNISOLONE ACETATE 40 MG/ML INJ SUSP (RADIOLOG
120.0000 mg | Freq: Once | INTRAMUSCULAR | Status: AC
  Administered 2018-10-15: 11:00:00 120 mg via EPIDURAL

## 2018-10-15 NOTE — Discharge Instructions (Signed)

## 2019-08-30 ENCOUNTER — Other Ambulatory Visit: Payer: Self-pay | Admitting: Orthopedic Surgery

## 2019-08-30 ENCOUNTER — Ambulatory Visit: Payer: Medicare Other | Admitting: Orthopedic Surgery

## 2019-08-30 ENCOUNTER — Other Ambulatory Visit: Payer: Self-pay

## 2019-08-30 VITALS — BP 160/90 | HR 66 | Temp 98.2°F | Ht 67.5 in | Wt 140.0 lb

## 2019-08-30 DIAGNOSIS — M1611 Unilateral primary osteoarthritis, right hip: Secondary | ICD-10-CM

## 2019-08-30 DIAGNOSIS — M5136 Other intervertebral disc degeneration, lumbar region: Secondary | ICD-10-CM | POA: Diagnosis not present

## 2019-08-30 DIAGNOSIS — M1610 Unilateral primary osteoarthritis, unspecified hip: Secondary | ICD-10-CM

## 2019-08-30 MED ORDER — MELOXICAM 7.5 MG PO TABS: 7.5000 mg | ORAL_TABLET | Freq: Every day | ORAL | 5 refills | Status: DC

## 2019-08-30 NOTE — Patient Instructions (Signed)
Go ahead and get the 2 esi injections   Start meloxicam   Follow up 2 weeks after 3 rd injection

## 2019-08-30 NOTE — Progress Notes (Signed)
Chief Complaint  Patient presents with  . Hip Pain    Right hip pain    History 79 year old male previously seen by neurosurgery for evaluation of lower back pain right hip and leg pain presents as a referral from chiropractor after chiropractic treatment and persistent pain right buttock right lower back radiating to the lateral right posterior thigh approximately to the level of the greater trochanter  He denies any groin pain  Last year he had one epidural injection he said it did not work and he stopped going  Dr. Arnoldo Morale has advised him to try to live with the back pain that he is having however after seeing a chiropractor and exhausting that treatment he was referred here for possible hip pathology    Review of Systems  All other systems reviewed and are negative.  Past Medical History:  Diagnosis Date  . Diabetes mellitus without complication   . Hyperlipidemia   . Hypertension    Past Surgical History:  Procedure Laterality Date  . INGUINAL HERNIA REPAIR Bilateral 08/10/2012   Procedure: HERNIA REPAIR INGUINAL ADULT;  Surgeon: Scherry Ran, MD;  Location: [facility];  Service: General;  Laterality: Bilateral;   Social History   Tobacco Use  . Smoking status: Former Smoker  Substance Use Topics  . Alcohol use: Yes    Comment: seldom  . Drug use: No   No family history on file.  BP (!) 160/90   Pulse 66   Temp 98.2 F (36.8 C)   Ht 5' 7.5" (1.715 m)   Wt 140 lb (63.5 kg)   BMI 21.60 kg/m    small thin frame no gross deformities grooming is normal  The vascular system no swelling or varicosities pulses and temperature normal no edema  In the right groin he is no lymphadenopathy  He does walk with an altered gait his stride length is short the cadence is slow  The left hip is nontender has full range of motion feels stable and has normal muscle strength and tone  The leg lengths appear to be equal when you ask him to straighten his knee which he was  holding in flexion with flexion at the hip  The right hip had 120 degrees of flexion without pain first 10 to 15 degrees of internal rotation there was no groin pain however when I pushed it more to 20 degrees he had some mild groin pain and otherwise stable hip with normal strength and muscle tone in the right leg  His tenderness was in his lower back on the left side the L5 region and 4 region on the middle and then on the right side as well as the right buttock and posterior aspect right thigh.  Skin was normal  There were no sensory deficits.  He was oriented to time person and place and his mood and affect did not show depression or agitation  An x-ray was brought from outside it showed a right hip with complete joint space loss indicative of osteoarthritis  He has back x-rays which were also outside films show degenerative disc disease  The MRI he had a 2020 last year report IMPRESSION: Significant RIGHT-sided neural impingement is likely at L2-3 and L3-4, where multifactorial spinal stenosis, and RIGHT subarticular zone/foraminal zone narrowing related to bony overgrowth, facet pathology, and disc material contribute to RIGHT L2, RIGHT L3, and RIGHT L4 neural impingement. See discussion above.  Similar LEFT-sided impingement at L4-5 and L5-S1, of a compensatory nature, where mild  stenosis is accompanied by moderate subarticular zone and severe foraminal zone narrowing, both levels.   Electronically Signed   By: Elsie Stain M.D.   On: 09/16/2018 16:09   Assessment and plan  I told him that hip replacement will not get rid of his buttock and lateral leg pain however it can make the back pain worse, have no effect on the back pain or improve the back pain and I cannot predict which 1 that would be a risk he would have to take  However, I recommend he get the 2 epidurals and see if any of his buttock and leg pain go away and if it does then her problem is solved  He can  also have the hip replacement if the epidurals do not do anything knowing that he may get some improvement in his back pain his groin pain which is mild may go away and he may walk better  I will see him after his second epidural  Chronic problem with exacerbation, prescription management  Meds ordered this encounter  Medications  . meloxicam (MOBIC) 7.5 MG tablet    Sig: Take 1 tablet (7.5 mg total) by mouth daily.    Dispense:  30 tablet    Refill:  5

## 2019-09-02 ENCOUNTER — Other Ambulatory Visit: Payer: Self-pay

## 2019-09-02 ENCOUNTER — Ambulatory Visit
Admission: RE | Admit: 2019-09-02 | Discharge: 2019-09-02 | Disposition: A | Discharge status: Home / Self Care | Payer: Medicare Other | Source: Ambulatory Visit | Attending: Orthopedic Surgery | Admitting: Orthopedic Surgery

## 2019-09-02 DIAGNOSIS — M5136 Other intervertebral disc degeneration, lumbar region: Secondary | ICD-10-CM

## 2019-09-02 MED ORDER — METHYLPREDNISOLONE ACETATE 40 MG/ML INJ SUSP (RADIOLOG
120.0000 mg | Freq: Once | INTRAMUSCULAR | Status: AC
  Administered 2019-09-02: 120 mg via EPIDURAL

## 2019-09-02 MED ORDER — IOPAMIDOL (ISOVUE-M 200) INJECTION 41%
1.0000 mL | Freq: Once | INTRAMUSCULAR | Status: AC
  Administered 2019-09-02: 1 mL via EPIDURAL

## 2019-09-02 NOTE — Discharge Instructions (Signed)

## 2019-09-21 ENCOUNTER — Other Ambulatory Visit: Payer: Self-pay | Admitting: Orthopedic Surgery

## 2019-09-21 DIAGNOSIS — M545 Low back pain, unspecified: Secondary | ICD-10-CM

## 2019-10-05 ENCOUNTER — Ambulatory Visit
Admission: RE | Admit: 2019-10-05 | Discharge: 2019-10-05 | Disposition: A | Discharge status: Home / Self Care | Payer: Medicare Other | Source: Ambulatory Visit | Attending: Orthopedic Surgery | Admitting: Orthopedic Surgery

## 2019-10-05 DIAGNOSIS — M545 Low back pain, unspecified: Secondary | ICD-10-CM

## 2019-10-05 MED ORDER — IOPAMIDOL (ISOVUE-M 200) INJECTION 41%
1.0000 mL | Freq: Once | INTRAMUSCULAR | Status: AC
  Administered 2019-10-05: 1 mL via EPIDURAL

## 2019-10-05 MED ORDER — METHYLPREDNISOLONE ACETATE 40 MG/ML INJ SUSP (RADIOLOG
120.0000 mg | Freq: Once | INTRAMUSCULAR | Status: AC
  Administered 2019-10-05: 120 mg via EPIDURAL

## 2019-10-05 NOTE — Discharge Instructions (Signed)

## 2019-11-15 ENCOUNTER — Ambulatory Visit: Payer: Medicare Other | Admitting: Orthopedic Surgery

## 2019-11-15 ENCOUNTER — Encounter: Payer: Self-pay | Admitting: Orthopedic Surgery

## 2019-11-15 ENCOUNTER — Other Ambulatory Visit: Payer: Self-pay

## 2019-11-15 VITALS — BP 181/69 | HR 69 | Ht 67.5 in | Wt 137.0 lb

## 2019-11-15 DIAGNOSIS — M1610 Unilateral primary osteoarthritis, unspecified hip: Secondary | ICD-10-CM

## 2019-11-15 DIAGNOSIS — M5136 Other intervertebral disc degeneration, lumbar region: Secondary | ICD-10-CM | POA: Diagnosis not present

## 2019-11-15 NOTE — Progress Notes (Signed)
Chief Complaint  Patient presents with  . Hip Pain    R/ hip hurting and back still bothering me    79 year old male status post several epidural injections with some improvement in his back pain presents for recheck after his epidurals.  He complains of pain in his right buttock lateral side of his right leg occasional left side of his lower back.  He said the injections did help his pain  His groin pain is once every 4 to 5 months and is relieved easily with rest  He does have some giving out symptoms of his right leg  His exam shows painless range of motion right hip with some limitations in internal rotation.  He had some pain with the straight leg raise maneuver in the Lasegue's sign  Tenderness right lower back as well.  At this point I would recommend avoiding any hip replacement surgery as it is unlikely to relieve his groin pain which is again once every 4 to 5 months  Encounter Diagnoses  Name Primary?  . DDD (degenerative disc disease), lumbar Yes  . Hip arthritis

## 2019-11-24 LAB — UNMAPPED LAB RESULTS
Basophil # (HT): 0 10 3/uL (ref 0.0–0.2)
Basophil % (HT): 0 % (ref 0–2)
Eosinophil # (HT): 0 10 3/uL (ref 0.0–0.5)
Eosinophil % (HT): 0 % (ref 0–7)
Hematocrit (HT): 38 % — ABNORMAL LOW (ref 40–52)
Hemoglobin (HGB) (HT): 12.5 g/dL — ABNORMAL LOW (ref 13.0–17.5)
Lymphocyte # (HT): 28.4 10 3/uL — ABNORMAL HIGH (ref 0.9–3.8)
Lymphocyte % (HT): 88 % — ABNORMAL HIGH (ref 17–44)
MCHC (HT): 33.1 g/dL (ref 32.0–36.0)
MCV (HT): 90.9 fL (ref 81.0–99.0)
Mean Corpuscular Hemoglobin (MCH) (HT): 30 pg (ref 26.0–34.0)
Monocyte # (HT): 1.3 10 3/uL — ABNORMAL HIGH (ref 0.2–1.0)
Monocyte % (HT): 4 % (ref 4–12)
Neutrophil # (HT): 2.6 10 3/uL (ref 1.5–7.7)
Platelets (HT): 137 10 3/uL — ABNORMAL LOW (ref 140–400)
RBC (HT): 4.16 10 6/uL — ABNORMAL LOW (ref 4.20–5.90)
RDW (HT): 12.5 % (ref 11.5–15.0)
Seg Neut % (HT): 8 % — ABNORMAL LOW (ref 40–75)
WBC (HT): 32.3 10 3/uL — ABNORMAL HIGH (ref 4.0–10.8)

## 2020-01-21 LAB — UNMAPPED LAB RESULTS
Basophil # (HT): 0 10 3/uL (ref 0.0–0.2)
Basophil % (HT): 0 % (ref 0–2)
Eosinophil # (HT): 0 10 3/uL (ref 0.0–0.5)
Eosinophil % (HT): 0 % (ref 0–7)
Hematocrit (HT): 38 % — ABNORMAL LOW (ref 40–52)
Hemoglobin (HGB) (HT): 12.8 g/dL — ABNORMAL LOW (ref 13.0–17.5)
Lymphocyte # (HT): 37.2 10 3/uL — ABNORMAL HIGH (ref 0.9–3.8)
Lymphocyte % (HT): 91 % — ABNORMAL HIGH (ref 17–44)
MCHC (HT): 33.7 g/dL (ref 32.0–36.0)
MCV (HT): 87.6 fL (ref 81.0–99.0)
Mean Corpuscular Hemoglobin (MCH) (HT): 29.5 pg (ref 26.0–34.0)
Monocyte # (HT): 0 10 3/uL — ABNORMAL LOW (ref 0.2–1.0)
Monocyte % (HT): 0 % — ABNORMAL LOW (ref 4–12)
Neutrophil # (HT): 3.7 10 3/uL (ref 1.5–7.7)
Platelets (HT): 170 10 3/uL (ref 140–400)
RBC (HT): 4.34 10 6/uL (ref 4.20–5.90)
RDW (HT): 12.8 % (ref 11.5–15.0)
Seg Neut % (HT): 9 % — ABNORMAL LOW (ref 40–75)
WBC (HT): 40.9 10 3/uL — ABNORMAL HIGH (ref 4.0–10.8)

## 2020-02-11 ENCOUNTER — Other Ambulatory Visit: Payer: Self-pay | Admitting: Neurosurgery

## 2020-02-11 DIAGNOSIS — M5416 Radiculopathy, lumbar region: Secondary | ICD-10-CM

## 2020-02-14 ENCOUNTER — Telehealth: Payer: Self-pay | Admitting: Nurse Practitioner

## 2020-02-14 NOTE — Telephone Encounter (Signed)
Phone call to patient to verify medication list and allergies for myelogram procedure. Pt aware he will not need to hold any medications for this procedure. Pre and post procedure instructions reviewed with pt. Pt verbalized understanding.  

## 2020-02-22 ENCOUNTER — Ambulatory Visit
Admission: RE | Admit: 2020-02-22 | Discharge: 2020-02-22 | Disposition: A | Discharge status: Home / Self Care | Payer: Medicare Other | Source: Ambulatory Visit | Attending: Neurosurgery | Admitting: Neurosurgery

## 2020-02-22 ENCOUNTER — Other Ambulatory Visit: Payer: Self-pay

## 2020-02-22 DIAGNOSIS — M5416 Radiculopathy, lumbar region: Secondary | ICD-10-CM

## 2020-02-22 MED ORDER — DIAZEPAM 5 MG PO TABS
5.0000 mg | ORAL_TABLET | Freq: Once | ORAL | Status: AC
  Administered 2020-02-22: 5 mg via ORAL

## 2020-02-22 MED ORDER — IOPAMIDOL (ISOVUE-M 200) INJECTION 41%: 15.0000 mL | Freq: Once | INTRAMUSCULAR | Status: DC

## 2020-02-22 NOTE — Discharge Instructions (Signed)

## 2020-03-03 LAB — UNMAPPED LAB RESULTS
Basophil # (HT): 0.2 10 3/uL (ref 0.0–0.2)
Basophil % (HT): 0 % (ref 0–2)
Eosinophil # (HT): 0.2 10 3/uL (ref 0.0–0.5)
Eosinophil % (HT): 0 % (ref 0–7)
Hematocrit (HT): 38 % — ABNORMAL LOW (ref 40–52)
Hemoglobin (HGB) (HT): 12.4 g/dL — ABNORMAL LOW (ref 13.0–17.5)
Lymphocyte # (HT): 38.2 10 3/uL — ABNORMAL HIGH (ref 0.9–3.8)
Lymphocyte % (HT): 82 % — ABNORMAL HIGH (ref 17–44)
MCHC (HT): 33 g/dL (ref 32.0–36.0)
MCV (HT): 90.6 fL (ref 81.0–99.0)
Mean Corpuscular Hemoglobin (MCH) (HT): 29.9 pg (ref 26.0–34.0)
Monocyte # (HT): 5.7 10 3/uL — ABNORMAL HIGH (ref 0.2–1.0)
Monocyte % (HT): 12 % (ref 4–12)
Neutrophil # (HT): 2.2 10 3/uL (ref 1.5–7.7)
Platelets (HT): 137 10 3/uL — ABNORMAL LOW (ref 140–400)
RBC (HT): 4.15 10 6/uL — ABNORMAL LOW (ref 4.20–5.90)
RDW (HT): 12.8 % (ref 11.5–15.0)
Seg Neut % (HT): 5 % — ABNORMAL LOW (ref 40–75)
WBC (HT): 46.5 10 3/uL — ABNORMAL HIGH (ref 4.0–10.8)

## 2020-03-19 ENCOUNTER — Inpatient Hospital Stay
Admission: EM | Admit: 2020-03-19 | Discharge: 2020-03-20 | DRG: 177 | Discharge status: Against Medical Advice | Payer: Medicare Other | Source: Ambulatory Visit | Attending: Internal Medicine | Admitting: Internal Medicine

## 2020-03-19 ENCOUNTER — Emergency Department: Payer: Medicare Other | Admitting: Radiology

## 2020-03-19 DIAGNOSIS — C9110 Chronic lymphocytic leukemia of B-cell type not having achieved remission: Secondary | ICD-10-CM | POA: Diagnosis present

## 2020-03-19 DIAGNOSIS — R197 Diarrhea, unspecified: Secondary | ICD-10-CM | POA: Diagnosis present

## 2020-03-19 DIAGNOSIS — E871 Hypo-osmolality and hyponatremia: Secondary | ICD-10-CM | POA: Diagnosis present

## 2020-03-19 DIAGNOSIS — Z7902 Long term (current) use of antithrombotics/antiplatelets: Secondary | ICD-10-CM

## 2020-03-19 DIAGNOSIS — R0902 Hypoxemia: Secondary | ICD-10-CM

## 2020-03-19 DIAGNOSIS — R531 Weakness: Secondary | ICD-10-CM

## 2020-03-19 DIAGNOSIS — F0280 Dementia in other diseases classified elsewhere without behavioral disturbance: Secondary | ICD-10-CM | POA: Diagnosis present

## 2020-03-19 DIAGNOSIS — I2510 Atherosclerotic heart disease of native coronary artery without angina pectoris: Secondary | ICD-10-CM | POA: Diagnosis present

## 2020-03-19 DIAGNOSIS — F329 Major depressive disorder, single episode, unspecified: Secondary | ICD-10-CM | POA: Diagnosis present

## 2020-03-19 DIAGNOSIS — R112 Nausea with vomiting, unspecified: Secondary | ICD-10-CM | POA: Diagnosis present

## 2020-03-19 DIAGNOSIS — N400 Enlarged prostate without lower urinary tract symptoms: Secondary | ICD-10-CM | POA: Diagnosis present

## 2020-03-19 DIAGNOSIS — Z66 Do not resuscitate: Secondary | ICD-10-CM | POA: Diagnosis present

## 2020-03-19 DIAGNOSIS — R591 Generalized enlarged lymph nodes: Secondary | ICD-10-CM | POA: Diagnosis present

## 2020-03-19 DIAGNOSIS — I10 Essential (primary) hypertension: Secondary | ICD-10-CM | POA: Diagnosis present

## 2020-03-19 DIAGNOSIS — U071 COVID-19: Secondary | ICD-10-CM

## 2020-03-19 DIAGNOSIS — N179 Acute kidney failure, unspecified: Secondary | ICD-10-CM | POA: Diagnosis present

## 2020-03-19 DIAGNOSIS — J9601 Acute respiratory failure with hypoxia: Secondary | ICD-10-CM | POA: Diagnosis present

## 2020-03-19 DIAGNOSIS — R0602 Shortness of breath: Secondary | ICD-10-CM

## 2020-03-19 DIAGNOSIS — E785 Hyperlipidemia, unspecified: Secondary | ICD-10-CM | POA: Diagnosis present

## 2020-03-19 DIAGNOSIS — Z955 Presence of coronary angioplasty implant and graft: Secondary | ICD-10-CM

## 2020-03-19 DIAGNOSIS — G309 Alzheimer's disease, unspecified: Secondary | ICD-10-CM | POA: Diagnosis present

## 2020-03-19 DIAGNOSIS — Z856 Personal history of leukemia: Secondary | ICD-10-CM

## 2020-03-19 DIAGNOSIS — I252 Old myocardial infarction: Secondary | ICD-10-CM

## 2020-03-19 DIAGNOSIS — C9590 Leukemia, unspecified not having achieved remission: Secondary | ICD-10-CM | POA: Diagnosis present

## 2020-03-19 DIAGNOSIS — J1282 Pneumonia due to coronavirus disease 2019: Secondary | ICD-10-CM | POA: Diagnosis present

## 2020-03-19 HISTORY — DX: Essential (primary) hypertension: I10

## 2020-03-19 HISTORY — DX: Atherosclerotic heart disease of native coronary artery without angina pectoris: I25.10

## 2020-03-19 HISTORY — DX: Acute myocardial infarction, unspecified: I21.9

## 2020-03-19 HISTORY — DX: Leukemia, unspecified not having achieved remission: C95.90

## 2020-03-19 HISTORY — DX: Hyperlipidemia, unspecified: E78.5

## 2020-03-19 LAB — COMPREHENSIVE METABOLIC PANEL
ALT: 46 U/L (ref 0–50)
AST: 183 U/L — ABNORMAL HIGH (ref 0–50)
Albumin: 3.4 g/dL — ABNORMAL LOW (ref 3.5–5.2)
Alk Phos: 104 U/L (ref 40–130)
Anion Gap: 17 — ABNORMAL HIGH (ref 7–16)
Bilirubin,Total: 0.6 mg/dL (ref 0.0–1.2)
CO2: 20 mmol/L (ref 20–28)
Calcium: 8.6 mg/dL (ref 8.6–10.2)
Chloride: 95 mmol/L — ABNORMAL LOW (ref 96–108)
Creatinine: 1.27 mg/dL — ABNORMAL HIGH (ref 0.67–1.17)
GFR,Black: 62 *
GFR,Caucasian: 54 * — AB
Glucose: 145 mg/dL — ABNORMAL HIGH (ref 60–99)
Lab: 24 mg/dL — ABNORMAL HIGH (ref 6–20)
Potassium: 3.7 mmol/L (ref 3.4–4.7)
Sodium: 132 mmol/L — ABNORMAL LOW (ref 133–145)
Total Protein: 6.7 g/dL (ref 6.3–7.7)

## 2020-03-19 LAB — DATE/TIME NOT PROVIDED

## 2020-03-19 LAB — CBC AND DIFFERENTIAL
Baso # K/uL: 0 10*3/uL (ref 0.0–0.1)
Basophil %: 0 %
Eos # K/uL: 0 10*3/uL (ref 0.0–0.5)
Eosinophil %: 0 %
Hematocrit: 38 % — ABNORMAL LOW (ref 40–51)
Hemoglobin: 12 g/dL — ABNORMAL LOW (ref 13.7–17.5)
Lymph # K/uL: 31.8 10*3/uL — ABNORMAL HIGH (ref 1.3–3.6)
Lymphocyte %: 73 %
MCH: 30 pg (ref 26–32)
MCHC: 32 g/dL (ref 32–37)
MCV: 93 fL — ABNORMAL HIGH (ref 79–92)
Mono # K/uL: 0.9 10*3/uL — ABNORMAL HIGH (ref 0.3–0.8)
Monocyte %: 2 %
Neut # K/uL: 10.9 10*3/uL — ABNORMAL HIGH (ref 1.8–5.4)
Nucl RBC # K/uL: 0 10*3/uL (ref 0.0–0.0)
Nucl RBC %: 0 /100 WBC (ref 0.0–0.2)
Platelets: 159 10*3/uL (ref 150–330)
RBC: 4.1 MIL/uL — ABNORMAL LOW (ref 4.6–6.1)
RDW: 13.9 % (ref 11.6–14.4)
Seg Neut %: 25 %
WBC: 43.6 10*3/uL — ABNORMAL HIGH (ref 4.2–9.1)

## 2020-03-19 LAB — LACTATE, PLASMA: Lactate: 0.9 mmol/L (ref 0.5–2.2)

## 2020-03-19 LAB — DIFF MANUAL: Diff Based On: 100 CELLS

## 2020-03-19 LAB — HM HIV SCREENING OFFERED

## 2020-03-19 MED ORDER — ACETAMINOPHEN 650 MG RE SUPP *I*
650.0000 mg | Freq: Four times a day (QID) | RECTAL | Status: DC | PRN
Start: 2020-03-19 — End: 2020-03-21

## 2020-03-19 MED ORDER — ONDANSETRON HCL 2 MG/ML IV SOLN *I*
4.0000 mg | Freq: Once | INTRAMUSCULAR | Status: AC
Start: 2020-03-19 — End: 2020-03-19
  Administered 2020-03-19: 4 mg via INTRAVENOUS
  Filled 2020-03-19: qty 2

## 2020-03-19 MED ORDER — ONDANSETRON HCL 2 MG/ML IV SOLN *I*
4.0000 mg | Freq: Four times a day (QID) | INTRAMUSCULAR | Status: DC | PRN
Start: 2020-03-19 — End: 2020-03-21

## 2020-03-19 MED ORDER — OXYCODONE HCL 5 MG PO TABS *I*
10.0000 mg | ORAL_TABLET | Freq: Four times a day (QID) | ORAL | Status: DC | PRN
Start: 2020-03-19 — End: 2020-03-21

## 2020-03-19 MED ORDER — IPRATROPIUM-ALBUTEROL 0.5-2.5 MG/3ML IN SOLN *I*
3.0000 mL | RESPIRATORY_TRACT | Status: AC
Start: 2020-03-19 — End: 2020-03-19
  Filled 2020-03-19: qty 3

## 2020-03-19 MED ORDER — ACETAMINOPHEN 325 MG PO TABS *I*
650.0000 mg | ORAL_TABLET | Freq: Four times a day (QID) | ORAL | Status: DC | PRN
Start: 2020-03-19 — End: 2020-03-21

## 2020-03-19 MED ORDER — DEXAMETHASONE SODIUM PHOSPHATE 10 MG/ML IJ SOLN *I*
10.0000 mg | Freq: Once | INTRAMUSCULAR | Status: AC
Start: 2020-03-19 — End: 2020-03-19
  Administered 2020-03-19: 10 mg via INTRAVENOUS
  Filled 2020-03-19: qty 1

## 2020-03-19 MED ORDER — SODIUM CHLORIDE 0.9 % IV BOLUS *I*
1000.0000 mL | Freq: Once | Status: AC
Start: 2020-03-19 — End: 2020-03-20
  Administered 2020-03-19: 1000 mL via INTRAVENOUS

## 2020-03-19 MED ORDER — SODIUM CHLORIDE 0.9 % IV SOLN WRAPPED *I*
125.0000 mL/h | Status: DC
Start: 2020-03-20 — End: 2020-03-20

## 2020-03-19 MED ORDER — OXYCODONE HCL 5 MG PO TABS *I*
5.0000 mg | ORAL_TABLET | Freq: Four times a day (QID) | ORAL | Status: DC | PRN
Start: 2020-03-19 — End: 2020-03-21

## 2020-03-19 NOTE — ED Provider Notes (Addendum)
History     Chief Complaint   Patient presents with    COVID-19 Concern    Diarrhea     Patient tested positive using a home test 1 week ago for COVID-19 virus.  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  He states that with the recent illness and Covid vaccination his WBC count has been running high.  Patient denies any chest pain, states that he does have dyspnea on exertion.  No abdominal pains.  Patient does have nausea vomiting and diarrhea.  Patient complains of feeling generally weak.  Fevers.  Patient states that his wife is also diagnosed with COVID-19 virus.  He states that they have both been in similar health, taking care of each other at home.  Patient is a retired Garment/textile technologist.          Medical/Surgical/Family History     Past Medical History:   Diagnosis Date    Coronary artery disease     Hyperlipemia     Hypertension     Leukemia     MI (myocardial infarction)         There is no problem list on file for this patient.           Past Surgical History:   Procedure Laterality Date    CORONARY ANGIOPLASTY WITH STENT PLACEMENT       History reviewed. No pertinent family history.       Social History     Tobacco Use    Smoking status: Never Smoker    Smokeless tobacco: Never Used   Substance Use Topics    Alcohol use: Yes     Comment: occasionally    Drug use: Never     Living Situation     Questions Responses    Patient lives with Spouse    Homeless No    Caregiver for other family member     Financial risk analyst     Employment Retired    Domestic Violence Risk No                Review of Systems   Review of Systems   Constitutional: Positive for activity change, appetite change, fatigue and fever. Negative for chills and unexpected weight change.   HENT: Negative for ear discharge, ear pain, postnasal drip, rhinorrhea, sore throat and trouble swallowing.    Eyes: Negative for photophobia, discharge and visual disturbance.    Respiratory: Positive for cough and shortness of breath. Negative for chest tightness and wheezing.    Cardiovascular: Negative for chest pain, palpitations and leg swelling.   Gastrointestinal: Positive for diarrhea, nausea and vomiting. Negative for abdominal pain and constipation.   Genitourinary: Negative for frequency and hematuria.   Neurological: Positive for weakness. Negative for dizziness, syncope and numbness.   Psychiatric/Behavioral: Negative for agitation, behavioral problems and decreased concentration.       Physical Exam     Triage Vitals  Triage Start: Start, (03/19/20 2011)   First Recorded BP: 131/65, Resp: 20, Temp: 37.4 C (99.3 F), Temp src: TEMPORAL Oxygen Therapy SpO2: 91 %, Oximetry Source: Lt Hand, O2 Device: None (Room air), Heart Rate: (!) 111, (03/19/20 2013) Heart Rate (via Pulse Ox): (!) 111, (03/19/20 2013).  First Pain Reported  0-10 Scale: 0, (03/19/20 2013)       Physical Exam  Vitals and nursing note reviewed.   Constitutional:       General: He is  not in acute distress.     Appearance: He is well-developed. He is ill-appearing. He is not diaphoretic.   HENT:      Head: Normocephalic and atraumatic.      Right Ear: External ear normal.      Left Ear: External ear normal.      Nose: Nose normal.   Eyes:      Conjunctiva/sclera: Conjunctivae normal.      Pupils: Pupils are equal, round, and reactive to light.   Cardiovascular:      Rate and Rhythm: Regular rhythm. Tachycardia present.      Heart sounds: Normal heart sounds.   Pulmonary:      Effort: Pulmonary effort is normal.      Breath sounds: Wheezing present.   Abdominal:      General: Bowel sounds are normal.      Palpations: Abdomen is soft.   Musculoskeletal:         General: Normal range of motion.      Cervical back: Normal range of motion and neck supple.   Skin:     General: Skin is warm and dry.   Neurological:      Mental Status: He is alert and oriented to person, place, and time.         Medical Decision Making    Patient seen by me on:  03/19/2020    Assessment:  Nausea vomiting and diarrhea.  Tachycardia.  Patient feels generally weak.  Poor appetite.  States that he feels dehydrated.  Patient has COVID-19 virus.    Differential diagnosis:  COVID-19 virus.  Dehydration.  Enteritis.    Plan:  IV fluids.  Zofran.    Orders Placed This Encounter      Blood culture      Blood culture      Urinalysis reflex to culture      *Chest STANDARD single view      CBC and differential      Comprehensive metabolic panel      Urinalysis with reflex to Microscopic UA and reflex to Bacterial Culture      COVID-19 PCR      Lactate, plasma      Lactate, plasma (CONDITIONAL)      COVID-19 Exposure Quarantine      Initiate droplet & contact isolation with eye protection      HM HIV SCREENING OFFERED      Insert peripheral IV        Independent review of: Existing labs, XRays, chart/prior records    ED Course and Disposition:  Hypoxic on RA.  Easily fatigued.  Able to walk 5-10 feet from bed, easily exhausted. Returned to bed.                Hoyle Barr, DO          Patsy Lager M, DO  67/12/45 [id number]       Hoyle Barr, DO  83/38/25 [id number]

## 2020-03-19 NOTE — ED Triage Notes (Signed)
Pt to ED with report of testing COVID positive on 03/13/2020 and still feeling sick. Reports diarrhea and states his O2 sat was in the 80's at home. Wife is also COVID positive (a week earlier). Pt has leukemia that was stable for 18 years until he got COVID vaccinated.    Prehospital medications given: Yes  Analgesia: Ibuprofen (last dose this morning)

## 2020-03-19 NOTE — ED Notes (Signed)
Ambulated pt around the room without O2.  SpO2 dropped from 96% on 2LPM to 89% on RA.  Pt endorsing mild SOB.  Returned to bed, placed O2 back on @ 2 LPM.  Advising ED Attending.

## 2020-03-20 DIAGNOSIS — I2510 Atherosclerotic heart disease of native coronary artery without angina pectoris: Secondary | ICD-10-CM

## 2020-03-20 DIAGNOSIS — I10 Essential (primary) hypertension: Secondary | ICD-10-CM

## 2020-03-20 DIAGNOSIS — N179 Acute kidney failure, unspecified: Secondary | ICD-10-CM

## 2020-03-20 DIAGNOSIS — Z856 Personal history of leukemia: Secondary | ICD-10-CM

## 2020-03-20 DIAGNOSIS — N400 Enlarged prostate without lower urinary tract symptoms: Secondary | ICD-10-CM

## 2020-03-20 DIAGNOSIS — I25118 Atherosclerotic heart disease of native coronary artery with other forms of angina pectoris: Secondary | ICD-10-CM

## 2020-03-20 DIAGNOSIS — J9601 Acute respiratory failure with hypoxia: Secondary | ICD-10-CM

## 2020-03-20 DIAGNOSIS — J1282 Pneumonia due to coronavirus disease 2019: Secondary | ICD-10-CM

## 2020-03-20 DIAGNOSIS — U071 COVID-19: Principal | ICD-10-CM

## 2020-03-20 DIAGNOSIS — C9590 Leukemia, unspecified not having achieved remission: Secondary | ICD-10-CM | POA: Diagnosis present

## 2020-03-20 DIAGNOSIS — C9500 Acute leukemia of unspecified cell type not having achieved remission: Secondary | ICD-10-CM

## 2020-03-20 DIAGNOSIS — R591 Generalized enlarged lymph nodes: Secondary | ICD-10-CM

## 2020-03-20 DIAGNOSIS — E871 Hypo-osmolality and hyponatremia: Secondary | ICD-10-CM

## 2020-03-20 LAB — COMPREHENSIVE METABOLIC PANEL
ALT: 43 U/L (ref 0–50)
AST: 148 U/L — ABNORMAL HIGH (ref 0–50)
Albumin: 2.7 g/dL — ABNORMAL LOW (ref 3.5–5.2)
Alk Phos: 90 U/L (ref 40–130)
Anion Gap: 16 (ref 7–16)
Bilirubin,Total: 0.4 mg/dL (ref 0.0–1.2)
CO2: 18 mmol/L — ABNORMAL LOW (ref 20–28)
Calcium: 8.1 mg/dL — ABNORMAL LOW (ref 8.6–10.2)
Chloride: 100 mmol/L (ref 96–108)
Creatinine: 1.06 mg/dL (ref 0.67–1.17)
GFR,Black: 77 *
GFR,Caucasian: 67 *
Glucose: 196 mg/dL — ABNORMAL HIGH (ref 60–99)
Lab: 22 mg/dL — ABNORMAL HIGH (ref 6–20)
Potassium: 4.2 mmol/L (ref 3.4–4.7)
Sodium: 134 mmol/L (ref 133–145)
Total Protein: 6 g/dL — ABNORMAL LOW (ref 6.3–7.7)

## 2020-03-20 LAB — PROCALCITONIN: Procalcitonin: 4.8 ng/mL — ABNORMAL HIGH (ref 0.00–0.08)

## 2020-03-20 LAB — CBC
Hematocrit: 37 % — ABNORMAL LOW (ref 40–51)
Hemoglobin: 11.6 g/dL — ABNORMAL LOW (ref 13.7–17.5)
MCH: 30 pg (ref 26–32)
MCHC: 31 g/dL — ABNORMAL LOW (ref 32–37)
MCV: 96 fL — ABNORMAL HIGH (ref 79–92)
Platelets: 146 10*3/uL — ABNORMAL LOW (ref 150–330)
RBC: 3.9 MIL/uL — ABNORMAL LOW (ref 4.6–6.1)
RDW: 14 % (ref 11.6–14.4)
WBC: 45.1 10*3/uL — ABNORMAL HIGH (ref 4.2–9.1)

## 2020-03-20 LAB — TROPONIN T 3 HR W/ DELTA HIGH SENSITIVITY (IP/ED ONLY)
HS TROP % Change: -19 % — ABNORMAL LOW (ref 0–20)
TROP T 0-3 HR DELTA High Sensitivity: -4 — ABNORMAL LOW (ref 0–11)
TROP T 3 HR High Sensitivity: 17 ng/L (ref 0–21)

## 2020-03-20 LAB — CRP: CRP: 183 mg/L — ABNORMAL HIGH (ref 0–8)

## 2020-03-20 LAB — CK: CK: 4979 U/L — ABNORMAL HIGH (ref 39–308)

## 2020-03-20 LAB — PROTIME-INR
INR: 1.2 — ABNORMAL HIGH (ref 0.9–1.1)
Protime: 14 s — ABNORMAL HIGH (ref 10.0–12.9)

## 2020-03-20 LAB — TROPONIN T 0 HR HIGH SENSITIVITY (IP/ED ONLY): TROP T 0 HR High Sensitivity: 21 ng/L (ref 0–21)

## 2020-03-20 LAB — FERRITIN: Ferritin: 459 ng/mL — ABNORMAL HIGH (ref 20–250)

## 2020-03-20 LAB — COVID-19 NAAT (PCR): COVID-19 NAAT (PCR): POSITIVE — AB

## 2020-03-20 LAB — TRIGLYCERIDES: Triglycerides: 67 mg/dL

## 2020-03-20 LAB — APTT: aPTT: 28.7 s (ref 25.8–37.9)

## 2020-03-20 LAB — LACTATE DEHYDROGENASE

## 2020-03-20 LAB — LACTATE, PLASMA: Lactate: 1.1 mmol/L (ref 0.5–2.2)

## 2020-03-20 LAB — D-DIMER, QUANTITATIVE: D-Dimer: 0.81 ug/mL FEU — ABNORMAL HIGH (ref 0.00–0.50)

## 2020-03-20 LAB — COVID-19 PCR

## 2020-03-20 MED ORDER — CLOPIDOGREL BISULFATE 75 MG PO TABS *I*
75.0000 mg | ORAL_TABLET | Freq: Every day | ORAL | Status: DC
Start: 2020-03-20 — End: 2020-03-21
  Administered 2020-03-20: 75 mg via ORAL
  Filled 2020-03-20: qty 1

## 2020-03-20 MED ORDER — DONEPEZIL HCL 10 MG PO TABS *I*
10.0000 mg | ORAL_TABLET | Freq: Every evening | ORAL | Status: DC
Start: 2020-03-20 — End: 2020-03-21
  Filled 2020-03-20 (×2): qty 1

## 2020-03-20 MED ORDER — CEFTRIAXONE SODIUM 1 G IN STERILE WATER 10ML SYRINGE *I*
1000.0000 mg | INTRAVENOUS | Status: DC
Start: 2020-03-20 — End: 2020-03-21
  Administered 2020-03-20: 1000 mg via INTRAVENOUS
  Filled 2020-03-20: qty 10

## 2020-03-20 MED ORDER — SODIUM CHLORIDE 0.9 % IV SOLN WRAPPED *I*
100.0000 mg | Freq: Two times a day (BID) | INTRAVENOUS | Status: DC
Start: 2020-03-20 — End: 2020-03-21
  Administered 2020-03-20: 100 mg via INTRAVENOUS
  Filled 2020-03-20: qty 10

## 2020-03-20 MED ORDER — DEXAMETHASONE 4 MG PO TABS *I*
6.0000 mg | ORAL_TABLET | Freq: Every day | ORAL | Status: DC
Start: 2020-03-20 — End: 2020-03-21
  Administered 2020-03-20: 6 mg via ORAL
  Filled 2020-03-20: qty 2

## 2020-03-20 MED ORDER — ASPIRIN 81 MG PO CHEW *I*
324.0000 mg | CHEWABLE_TABLET | Freq: Every day | ORAL | Status: DC
Start: 2020-03-20 — End: 2020-03-21
  Administered 2020-03-20: 324 mg via ORAL
  Filled 2020-03-20: qty 4

## 2020-03-20 MED ORDER — BUPROPION HCL 150 MG PO TB12 *I*
150.0000 mg | ORAL_TABLET | Freq: Three times a day (TID) | ORAL | Status: DC
Start: 2020-03-20 — End: 2020-03-21
  Filled 2020-03-20 (×2): qty 1

## 2020-03-20 MED ORDER — ENOXAPARIN SODIUM 40 MG/0.4ML IJ SOSY *I*
40.0000 mg | PREFILLED_SYRINGE | Freq: Every day | INTRAMUSCULAR | Status: DC
Start: 2020-03-20 — End: 2020-03-21

## 2020-03-20 MED ORDER — FINASTERIDE 5 MG PO TABS *I*
5.0000 mg | ORAL_TABLET | Freq: Every day | ORAL | Status: DC
Start: 2020-03-20 — End: 2020-03-21
  Filled 2020-03-20: qty 1

## 2020-03-20 MED ORDER — LACTATED RINGERS IV SOLN *I*
100.0000 mL/h | INTRAVENOUS | Status: AC
Start: 2020-03-20 — End: 2020-03-20
  Administered 2020-03-20 (×3): 100 mL/h via INTRAVENOUS

## 2020-03-20 MED ORDER — GABAPENTIN 300 MG PO CAPSULE *I*
600.0000 mg | ORAL_CAPSULE | Freq: Every evening | ORAL | Status: DC
Start: 2020-03-20 — End: 2020-03-21

## 2020-03-20 MED ORDER — ATORVASTATIN CALCIUM 40 MG PO TABS *I*
80.0000 mg | ORAL_TABLET | Freq: Every day | ORAL | Status: DC
Start: 2020-03-20 — End: 2020-03-21
  Filled 2020-03-20: qty 2

## 2020-03-20 MED ORDER — BUPROPION HCL 150 MG PO TB24 *I*
150.0000 mg | ORAL_TABLET | Freq: Every morning | ORAL | Status: DC
Start: 2020-03-20 — End: 2020-03-20
  Filled 2020-03-20: qty 1

## 2020-03-20 MED ORDER — GABAPENTIN 300 MG PO CAPSULE *I*
600.0000 mg | ORAL_CAPSULE | Freq: Three times a day (TID) | ORAL | Status: DC
Start: 2020-03-20 — End: 2020-03-20
  Filled 2020-03-20: qty 2

## 2020-03-20 MED ORDER — ZINC SULFATE 220 MG PO CAPS *I*
220.0000 mg | ORAL_CAPSULE | Freq: Every day | ORAL | Status: DC
Start: 2020-03-20 — End: 2020-03-21
  Filled 2020-03-20: qty 1

## 2020-03-20 NOTE — ED Notes (Signed)
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 Patient re-directed that Social Work is arranging oxygen delivery and to please await this arrangement.

## 2020-03-20 NOTE — Discharge Instructions (Signed)
Return to the hospital if your breathing worsens or you decide you want treatment

## 2020-03-20 NOTE — Progress Notes (Signed)
03/20/20 0800   [facility] Patient Class Review   Patient Class Review Inpatient   Patient Class effective 03/19/2020

## 2020-03-20 NOTE — ED Notes (Signed)
Patient torn out IVs wants to leave AMA. MD paged.

## 2020-03-20 NOTE — ED Notes (Addendum)
Oxygen delivered, patient maintains his desire to leave AMA. Hospital MD previously has seen patient and explained risks to patient. Patient requsting social work to arrange ride home. Social work to speak with patient.

## 2020-03-20 NOTE — Progress Notes (Signed)
Current state of emergency related to COVID-19. Documentation completed per hospital emergency response standard.

## 2020-03-20 NOTE — ED Notes (Signed)
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

## 2020-03-20 NOTE — ED Notes (Signed)
Patient ambulatory out of ED AMA with steady gait and oxygen tank and tubing with patient. AMA paperwork signed and Hospital MD reported obtained informed consent prior to departure.

## 2020-03-20 NOTE — Discharge Summary (Signed)
Discharge Summary      Patient Name: Larry Choi  Date of Birth: 1940-05-11  MRN#: [id number]  Admission Date: 03/19/2020  Date of Service: 03/20/2020    Primary Discharge Diagnoses: Acute hypoxemic respiratory failure due to COVID-19    Secondary Diagnoses:  Active Hospital Problems    Diagnosis     *!*Acute hypoxemic respiratory failure due to COVID-19     Leukemia     Coronary artery disease     Essential hypertension     Acute kidney injury     Hyponatremia     Personal history of CLL (chronic lymphocytic leukemia)     Pneumonia due to COVID-19 virus     COVID-19     Lymphadenopathy     BPH (benign prostatic hyperplasia)          Hospital Course: In summary this is a 79 y/o male with a past medical history significant for CLL with concern for recent recurrence, CAD, HTN, dyslipidemia, and BPH, who presented to the ED on 03/19/20 due to worsening shortness of breath, fatigue, fevers/chills, and diarrhea with known COVID diagnosed 10 days prior. Workup in the ED found him to be hypoxic on room air with infiltrates on imaging and acute kidney injury. He was admitted, started on decadron, abx, and IVF. The following day he removed his IV and informed nursing he was leaving. When I met with the patient he was not under the influence of any medications and had an O2 sat >95%. He had a number of reasons for not wanting to remain in the hospital from "I know I don't have long with all my medical issues" (referring to his likely relapse of CLL), to not having any money and not wanting to rack up debt, to believing he can get better at home. 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 He would accept home O2 if this was arranged but is leaving the hospital today regardless. He understands and accepts the risks associated with his decision. He will be sent home  with O2 and was encouraged to return to the ED if he wished to resume treatment or worsens    Discharge Exam:  BP: (102-136)/(55-66)   Temp:  [37.4 C (99.3 F)]   Temp src: Temporal (11/14 2013)  Heart Rate:  [72-111]   Resp:  [18-20]   SpO2:  [90 %-96 %]   Height:  [175.3 cm (_0 )]   Weight:  [76.2 kg (168 lb)]   General: Pleasant and cooperative. In no apparent distress.  HEENT: NCAT. Pupils equally round, sclera normal. Lids non-drooping. Hearing intact  Neck: Supple, trachea midline  Cardiovascular: Regular rate and rhythm, +murmur  Pulmonary/Chest: Bibasilar crackles. No wheezes. No tachypnea, conversational dyspnea, or accessory muscle use. Frequent coughing with deep inspiration  Abdominal: Soft. Nontender. Nondistended. Normoactive bowel sounds  Extremity: No clubbing, edema, or cyanosis.  Neurologic: Alert and oriented x3. No gross deficits. Speech normal in context and clarity. No involuntary movements or tremors    Radiology:  *Chest STANDARD single view    Result Date: 03/20/2020  Patchy areas in the right upper lobe and bibasilar lungs suspicious for multilobar process such as pneumonia in appropriate clinical setting. Close clinical correlation and follow-up chest x-ray using PA and lateral technique in 6 weeks is recommended to  ensure resolution. END OF IMPRESSION  [facility] submits this DICOM format image data and final report to the [facility], an independent secure electronic health information exchange, on a reciprocally searchable basis (with patient authorization) for a minimum of 12 months after exam date.        Labs:  Recent Results (from the past 24 hour(s))   HM HIV SCREENING OFFERED    Collection Time: 03/19/20  8:14 PM   Result Value Ref Range    HM HIV SCREENING OFFERED Declined    CBC and differential    Collection Time: 03/19/20  9:28 PM   Result Value Ref Range    WBC 43.6 (H) 4.2 - 9.1 THOU/uL    RBC 4.1 (L) 4.6 - 6.1 MIL/uL    Hemoglobin 12.0 (L) 13.7 - 17.5 g/dL     Hematocrit 38 (L) 40 - 51 %    MCV 93 (H) 79 - 92 fL    MCH 30 26 - 32 pg    MCHC 32 32 - 37 g/dL    RDW 13.9 11.6 - 14.4 %    Platelets 159 150 - 330 THOU/uL    Seg Neut % 25.0 %    Lymphocyte % 73.0 %    Monocyte % 2.0 %    Eosinophil % 0.0 %    Basophil % 0.0 %    Neut # K/uL 10.9 (H) 1.8 - 5.4 THOU/uL    Lymph # K/uL 31.8 (H) 1.3 - 3.6 THOU/uL    Mono # K/uL 0.9 (H) 0.3 - 0.8 THOU/uL    Eos # K/uL 0.0 0.0 - 0.5 THOU/uL    Baso # K/uL 0.0 0.0 - 0.1 THOU/uL    Nucl RBC % 0.0 0.0 - 0.2 /100 WBC    Nucl RBC # K/uL 0.0 0.0 - 0.0 THOU/uL   Comprehensive metabolic panel    Collection Time: 03/19/20  9:28 PM   Result Value Ref Range    Sodium 132 (L) 133 - 145 mmol/L    Potassium 3.7 3.4 - 4.7 mmol/L    Chloride 95 (L) 96 - 108 mmol/L    CO2 20 20 - 28 mmol/L    Anion Gap 17 (H) 7 - 16    UN 24 (H) 6 - 20 mg/dL    Creatinine 1.27 (H) 0.67 - 1.17 mg/dL    GFR,Caucasian 54 (!) *    GFR,Black 62 *    Glucose 145 (H) 60 - 99 mg/dL    Calcium 8.6 8.6 - 10.2 mg/dL    Total Protein 6.7 6.3 - 7.7 g/dL    Albumin 3.4 (L) 3.5 - 5.2 g/dL    Bilirubin,Total 0.6 0.0 - 1.2 mg/dL    AST 183 (H) 0 - 50 U/L    ALT 46 0 - 50 U/L    Alk Phos 104 40 - 130 U/L   Lactate, plasma    Collection Time: 03/19/20  9:28 PM   Result Value Ref Range    Lactate CANCELED 0.5 - 2.2 mmol/L   Diff manual    Collection Time: 03/19/20  9:28 PM   Result Value Ref Range    Manual DIFF RESULTS     Diff Based On 100 CELLS   COVID-19 PCR    Collection Time: 03/19/20  9:29 PM   Result Value Ref Range    COVID-19 Source Nasopharyngeal     COVID-19 PCR POS (!) NEG   Blood culture    Collection Time: 03/19/20  9:29 PM  Specimen: Blood: aerobic/anaerobic; NO SITE PROVIDED   Result Value Ref Range    Bacterial Blood Culture .    Blood culture    Collection Time: 03/19/20  9:29 PM    Specimen: Blood: aerobic/anaerobic; NO SITE PROVIDED   Result Value Ref Range    Bacterial Blood Culture .    Date/time not provided    Collection Time: 03/19/20 10:02 PM   Result Value  Ref Range    Date/Time Not Provided see below    Lactate, plasma    Collection Time: 03/19/20 10:10 PM   Result Value Ref Range    Lactate 0.9 0.5 - 2.2 mmol/L   Lactate dehydrogenase    Collection Time: 03/20/20  2:36 AM   Result Value Ref Range    LD CANCELED 118 - 225 U/L   Ferritin    Collection Time: 03/20/20  2:36 AM   Result Value Ref Range    Ferritin 459 (H) 20 - 250 ng/mL   Lactate, plasma    Collection Time: 03/20/20  2:36 AM   Result Value Ref Range    Lactate 1.1 0.5 - 2.2 mmol/L   C reactive protein    Collection Time: 03/20/20  2:36 AM   Result Value Ref Range    CRP 183 (H) 0 - 8 mg/L   CK    Collection Time: 03/20/20  2:36 AM   Result Value Ref Range    CK CANCELED 39 - 308 U/L   D-dimer, quantitative    Collection Time: 03/20/20  2:36 AM   Result Value Ref Range    D-Dimer 0.81 (H) 0.00 - 0.50 ug/mL FEU   Triglycerides    Collection Time: 03/20/20  2:36 AM   Result Value Ref Range    Triglycerides 67 mg/dL   Protime-INR    Collection Time: 03/20/20  2:36 AM   Result Value Ref Range    Protime 14.0 (H) 10.0 - 12.9 sec    INR 1.2 (H) 0.9 - 1.1   APTT    Collection Time: 03/20/20  2:36 AM   Result Value Ref Range    aPTT 28.7 25.8 - 37.9 sec   Troponin T 0 HR High Sensitivity    Collection Time: 03/20/20  2:36 AM   Result Value Ref Range    TROP T 0 HR High Sensitivity CANCELED 0 - 21 ng/L   Procalcitonin    Collection Time: 03/20/20  2:36 AM   Result Value Ref Range    Procalcitonin 4.80 (H) 0.00 - 0.08 ng/mL   Troponin T 0 HR High Sensitivity    Collection Time: 03/20/20  3:45 AM   Result Value Ref Range    TROP T 0 HR High Sensitivity CANCELED 0 - 21 ng/L   CK    Collection Time: 03/20/20  3:45 AM   Result Value Ref Range    CK CANCELED 39 - 308 U/L   CK    Collection Time: 03/20/20  4:58 AM   Result Value Ref Range    CK 4,979 (H) 39 - 308 U/L   Troponin T 0 HR High Sensitivity    Collection Time: 03/20/20  4:58 AM   Result Value Ref Range    TROP T 0 HR High Sensitivity 21 0 - 21 ng/L    Comprehensive metabolic panel    Collection Time: 03/20/20  5:58 AM   Result Value Ref Range    Sodium 134 133 - 145 mmol/L    Potassium 4.2  3.4 - 4.7 mmol/L    Chloride 100 96 - 108 mmol/L    CO2 18 (L) 20 - 28 mmol/L    Anion Gap 16 7 - 16    UN 22 (H) 6 - 20 mg/dL    Creatinine 1.06 0.67 - 1.17 mg/dL    GFR,Caucasian 67 *    GFR,Black 77 *    Glucose 196 (H) 60 - 99 mg/dL    Calcium 8.1 (L) 8.6 - 10.2 mg/dL    Total Protein 6.0 (L) 6.3 - 7.7 g/dL    Albumin 2.7 (L) 3.5 - 5.2 g/dL    Bilirubin,Total 0.4 0.0 - 1.2 mg/dL    AST 148 (H) 0 - 50 U/L    ALT 43 0 - 50 U/L    Alk Phos 90 40 - 130 U/L   CBC    Collection Time: 03/20/20  5:58 AM   Result Value Ref Range    WBC 45.1 (H) 4.2 - 9.1 THOU/uL    RBC 3.9 (L) 4.6 - 6.1 MIL/uL    Hemoglobin 11.6 (L) 13.7 - 17.5 g/dL    Hematocrit 37 (L) 40 - 51 %    MCV 96 (H) 79 - 92 fL    MCH 30 26 - 32 pg    MCHC 31 (L) 32 - 37 g/dL    RDW 14.0 11.6 - 14.4 %    Platelets 146 (L) 150 - 330 THOU/uL   Troponin T 3 HR W/ Delta High Sensitivity    Collection Time: 03/20/20  9:54 AM   Result Value Ref Range    TROP T 3 HR High Sensitivity 17 0 - 21 ng/L    TROP T 0-3 HR DELTA High Sensitivity -4 (L) 0 - 11    HS TROP % Change -19 (L) 0 - 20 %           Microbiology  Results for orders placed or performed during the hospital encounter of 03/19/20 (from the past 2016 hour(s))   Blood culture    Collection Time: 03/19/20  9:29 PM    Specimen: Blood: aerobic/anaerobic; NO SITE PROVIDED   Result Value Ref Range    Bacterial Blood Culture .     Narrative    No growth to date   Blood culture    Collection Time: 03/19/20  9:29 PM    Specimen: Blood: aerobic/anaerobic; NO SITE PROVIDED   Result Value Ref Range    Bacterial Blood Culture .     Narrative    No growth to date           Pending results  Pending Labs Upon Discharge     Order Current Status    Hematopathology Review In process    Blood culture Preliminary result    Blood culture Preliminary result      Laboratory Tests Pending  Results     Order Current Status    Hematopathology Review In process    Blood culture Preliminary result    Blood culture Preliminary result            Disposition: home against medical advice  Disposition-[place]: home with no assistance    Patient Instructions:      Medication List      CONTINUE taking these medications    aspirin 325 mg tablet     atorvastatin 80 mg tablet  Commonly known as: LIPITOR     buPROPion 150 mg 12 hr tablet  Commonly known as: WELLBUTRIN SR  clopidogrel 75 mg tablet  Commonly known as: PLAVIX     donepezil 10 MG tablet  Commonly known as: ARICEPT     finasteride 5 mg tablet  Commonly known as: PROSCAR     gabapentin 600 mg tablet  Commonly known as: NEURONTIN     lisinopril 40 mg tablet  Commonly known as: PRINIVIL,ZESTRIL     Metoprolol Succinate 50 MG Cs24            Activity: activity as tolerated  Diet: Regular - No restrictions  Wound Care: none needed  Follow-up appointments:   Follow-up Information     PCP In 1 week.                        35 minutes or greater were spent on the discharge of this patient    Signed:  Moshe Salisbury, DO  03/20/2020  3:16 PM

## 2020-03-20 NOTE — ED Notes (Addendum)
Patient reports wanting to speak with MD about signing out [facility]. MD paged.

## 2020-03-20 NOTE — ED Notes (Signed)
Patient dressed and awaiting taxi that he is arranging.

## 2020-03-20 NOTE — H&P (Signed)
History and Physical  Hospital Medicine    Patient Name: Larry Choi  Date of Birth: 1941/04/26  MRN#: [id number]  Admission Date: 03/19/2020  Date of Service: 03/20/2020    Chief complaint: Shortness of breath, fever    History of Present Illness     Larry Choi is a 79 y.o. male who tested positive for COVID-19 and first developed symptoms 10 days ago (10/5) of fevers, rigors, malaise, diarrhea, nausea, cough and shortness of breath. His wife is also COVID-19 positive but has recovered. Patient came to the ER because his symptoms persisted and he was wondering if he was eligible for antibodies. Found to have hypoxia to 89% on room air.     Denies any chest pain, chest pressure, pleurisy or anosmia.    Patient had been self medicating with pencillin 1 g TID for 3 days prior to arrival     Past Medical History   Medical History  Past Medical History:   Diagnosis Date    Coronary artery disease     Hyperlipemia     Hypertension     Leukemia     MI (myocardial infarction)        Surgical History  Past Surgical History:   Procedure Laterality Date    CORONARY ANGIOPLASTY WITH STENT PLACEMENT         Family History  History reviewed. No pertinent family history.     Social History  Social History     Socioeconomic History    Marital status: Married     Spouse name: Not on file    Number of children: Not on file    Years of education: Not on file    Highest education level: Not on file   Occupational History    Not on file   Tobacco Use    Smoking status: Never Smoker    Smokeless tobacco: Never Used   Substance and Sexual Activity    Alcohol use: Yes     Comment: occasionally    Drug use: Never    Sexual activity: Yes     Partners: Female   Social History Narrative    Not on file       Allergies  No Known Allergies (drug, envir, food or latex)    Current Medications  Prior to Admission medications    Medication Sig Start Date End Date Taking? Authorizing Provider   aspirin 325 mg tablet Take 325 mg  by mouth daily   Yes [provider]   atorvastatin (LIPITOR) 80 mg tablet Take 80 mg by mouth daily   Yes [provider]   buPROPion (WELLBUTRIN XL) 150 mg 24 hr tablet Take 150 mg by mouth every morning  Swallow whole. Do not crush, break, or chew.   Yes [provider]   clopidogrel (PLAVIX) 75 mg tablet Take 75 mg by mouth daily   Yes [provider]   donepezil (ARICEPT) 10 MG tablet Take 10 mg by mouth nightly   Yes [provider]   finasteride (PROSCAR) 5 mg tablet Take 5 mg by mouth daily  For use by men only.   Yes [provider]   gabapentin (NEURONTIN) 600 mg tablet Take 600 mg by mouth 3 times daily   Yes [provider]   lisinopril (PRINIVIL,ZESTRIL) 40 mg tablet Take 40 mg by mouth daily   Yes [provider]   Metoprolol Succinate 50 MG CS24 Take by mouth   Yes [provider]     Review of Systems   Review of Systems   Constitutional: Positive for chills and fever.   HENT: Negative for hearing loss and sore throat.    Eyes: Negative for blurred vision and double vision.   Respiratory: Positive for cough and shortness of breath. Negative for wheezing.    Cardiovascular: Negative for chest pain, palpitations, leg swelling and PND.   Gastrointestinal: Positive for diarrhea and nausea. Negative for abdominal pain and vomiting.   Genitourinary: Negative for dysuria and urgency.   Musculoskeletal: Negative for falls and myalgias.   Skin: Negative for itching and rash.   Neurological: Negative for dizziness and headaches.     Physical Exam   Vitals: BP 102/55 (BP Location: Left arm)    Pulse (!) 111    Temp 37.4 C (99.3 F) (Temporal)    Resp 18    Ht 1.753 m ($Remove'5\' 9"'[id number]$ )    Wt 76.2 kg (168 lb)    SpO2 96%    BMI 24.81 kg/m   General: Resting comfortably. No acute distress  HEENT: NCAT. PERRLA, EOMI. No scleral icterus. Hearing intact.   Neck: Supple. Trachea midline. No thyromegaly.  Cardiovascular: Regular rate and rhythm.  No murmurs.  Pulmonary: Breathing comfortably on 3L O2. Crackles right lower lung field.  No accessory muscle use  GI: Soft. Nontender. Nondistended. Normoactive bowel sounds. No guarding, rebound, or rigidity  Skin: No jaundice. rashes or lesions  Extremities:No edema in bilateral upper and lower extremities. No clubbing or cyanosis  Neurologic: Alert and oriented x3. CN II-XII grossly intact. No slurred speech. No facial asymmetry    Labs/Imaging     Recent Results (from the past 24 hour(s))   HM HIV SCREENING OFFERED    Collection Time: 03/19/20  8:14 PM   Result Value Ref Range    HM HIV SCREENING OFFERED Declined    CBC and differential    Collection Time: 03/19/20  9:28 PM   Result Value Ref Range    WBC 43.6 (H) 4.2 - 9.1 THOU/uL    RBC 4.1 (L) 4.6 - 6.1 MIL/uL    Hemoglobin 12.0 (L) 13.7 - 17.5 g/dL    Hematocrit 38 (L) 40 - 51 %    MCV 93 (H) 79 - 92 fL    MCH 30 26 - 32 pg    MCHC 32 32 - 37 g/dL    RDW 13.9 11.6 - 14.4 %    Platelets 159 150 - 330 THOU/uL    Seg Neut % 25.0 %    Lymphocyte % 73.0 %    Monocyte % 2.0 %    Eosinophil % 0.0 %    Basophil % 0.0 %    Neut # K/uL 10.9 (H) 1.8 - 5.4 THOU/uL    Lymph # K/uL 31.8 (H) 1.3 - 3.6 THOU/uL    Mono # K/uL 0.9 (H) 0.3 - 0.8 THOU/uL    Eos # K/uL 0.0 0.0 - 0.5 THOU/uL    Baso # K/uL 0.0 0.0 - 0.1 THOU/uL    Nucl RBC % 0.0 0.0 - 0.2 /100 WBC    Nucl RBC # K/uL 0.0 0.0 - 0.0 THOU/uL   Comprehensive metabolic panel    Collection Time: 03/19/20  9:28 PM   Result Value Ref Range    Sodium 132 (L) 133 - 145 mmol/L    Potassium 3.7 3.4 - 4.7 mmol/L    Chloride 95 (L) 96 - 108 mmol/L    CO2 20  20 - 28 mmol/L    Anion Gap 17 (H) 7 - 16    UN 24 (H) 6 - 20 mg/dL    Creatinine 1.27 (H) 0.67 - 1.17 mg/dL    GFR,Caucasian 54 (!) *    GFR,Black 62 *    Glucose 145 (H) 60 - 99 mg/dL    Calcium 8.6 8.6 - 10.2 mg/dL    Total Protein 6.7 6.3 - 7.7 g/dL    Albumin 3.4 (L) 3.5 - 5.2 g/dL    Bilirubin,Total 0.6 0.0 - 1.2 mg/dL    AST 183 (H) 0 - 50 U/L    ALT 46 0 - 50 U/L     Alk Phos 104 40 - 130 U/L   Lactate, plasma    Collection Time: 03/19/20  9:28 PM   Result Value Ref Range    Lactate CANCELED 0.5 - 2.2 mmol/L   Diff manual    Collection Time: 03/19/20  9:28 PM   Result Value Ref Range    Manual DIFF RESULTS     Diff Based On 100 CELLS   Date/time not provided    Collection Time: 03/19/20 10:02 PM   Result Value Ref Range    Date/Time Not Provided see below    Lactate, plasma    Collection Time: 03/19/20 10:10 PM   Result Value Ref Range    Lactate 0.9 0.5 - 2.2 mmol/L   Lactate, plasma    Collection Time: 03/20/20  2:36 AM   Result Value Ref Range    Lactate 1.1 0.5 - 2.2 mmol/L   C reactive protein    Collection Time: 03/20/20  2:36 AM   Result Value Ref Range    CRP 183 (H) 0 - 8 mg/L   CK    Collection Time: 03/20/20  2:36 AM   Result Value Ref Range    CK CANCELED 39 - 308 U/L   D-dimer, quantitative    Collection Time: 03/20/20  2:36 AM   Result Value Ref Range    D-Dimer 0.81 (H) 0.00 - 0.50 ug/mL FEU   Triglycerides    Collection Time: 03/20/20  2:36 AM   Result Value Ref Range    Triglycerides 67 mg/dL   Protime-INR    Collection Time: 03/20/20  2:36 AM   Result Value Ref Range    Protime 14.0 (H) 10.0 - 12.9 sec    INR 1.2 (H) 0.9 - 1.1   APTT    Collection Time: 03/20/20  2:36 AM   Result Value Ref Range    aPTT 28.7 25.8 - 37.9 sec   Troponin T 0 HR High Sensitivity    Collection Time: 03/20/20  2:36 AM   Result Value Ref Range    TROP T 0 HR High Sensitivity CANCELED 0 - 21 ng/L   Procalcitonin    Collection Time: 03/20/20  2:36 AM   Result Value Ref Range    Procalcitonin 4.80 (H) 0.00 - 0.08 ng/mL   Troponin T 0 HR High Sensitivity    Collection Time: 03/20/20  3:45 AM   Result Value Ref Range    TROP T 0 HR High Sensitivity CANCELED 0 - 21 ng/L   CK    Collection Time: 03/20/20  3:45 AM   Result Value Ref Range    CK CANCELED 39 - 308 U/L     No results found.    Hospital Problems     Principal Problem:    Acute hypoxemic respiratory failure due  to COVID-19  Active  Problems:    Leukemia    Coronary artery disease    Essential hypertension    Acute kidney injury    Hyponatremia    Assessment and Plan     79 y/o male with PMH CAD (s/p stents), HTN, depression, leukemia/lymphoma (undergoing diagnostic testing currently has not received treatment, WBCs 43.6, 31.8K lymphocytes) who presents due to 10 days of COVID-19 symptoms (fevers, rigors, malaise, diarrhea, nausea, cough and shortness of breath) after he and his wife tested positive for COVID-19. Patient found to be hypoxic to 89% on room air per ER physician, now comfortable on 2L O2. Due to possible immunocompromise with undifferentiated hematologic malignancy and procalcitonin of 4.8, favor empiric antibiotics ceftriaxone/doxycyline. Patient had been self medicating with pencillin 1 g TID for 3 days prior to arrival (retired Garment/textile technologist). Will also give gentle fluids for acute kidney injury (Cr 1.27).    Acute hypoxemic respiratory failure with COVID-19 and possible immunocompromised status  - Wean O2 as tolerated, target SpO2 >90%  - Dexamethasone 6 mg daily  - Check COVID-19 hospital admission labs  - Procalcitonin 4.8   - Will empirically treat with ceftriaxone/doxycyline with unclear immune status    Acute kidney injury  - Cr 1.27  - Received 1L NS in ER  - Give another liter LR over 10 hours  - Hold home lisinopril    Depression  - c/w home wellbutrin  - c/w home gabapentin 600 mg    CAD  - c/w home aspirin  - c/w home plavix    HTN  - Some softer BP noted, will hold BP meds for now  - Hold lisinopril as above  - Hold metoprolol    Alzheimer's  - c/w home donepezil    BPH  - c/w home finasteride 5 mg daily    Fluids & Electrolytes/ Nutrition: LR 1 liter, regular diet  Mobility Plan/Ability: Ad lib   Drains / Lines / Tubes/Foley: N/A  DVT prophylaxis: Lovenox  Indication for gastric acid suppression: N/A  Type and frequency of Labs: Daily CMP and CBC  Family updates/concerns: Spoke with patient about COVID-19 diagnosis.  He relayed concerns about monoclonal antibodies being withheld due to cost concerns from the institution.   Code Status: DNR/DNI per discussion with patient. Needs MOLST filled out    Medical Necessity      Differential Dx: COVID-19 pneumonia in immunocompromised patient, acute kidney injury   Severity of Illness: High  Intensity of Service: High  Desired Clinical Goal prior to Discharge: Improved Acute Kidney Injury, improving oxygenation,    Estimated duration of LOS and why: 2-4 days for supplemental O2, steroids, close vital sign monitoring.    Attestation:  This medically necessary hospital stay is expected to be 2 or more midnights and, in my clinical judgment (as detailed above) should be INPATIENT status.     Signed:  Gardiner Sleeper, MD  03/20/2020  5:07 AM  [id number]

## 2020-03-20 NOTE — ED Notes (Signed)
Social Work Note    Spoke with Moshe Salisbury, DO.  Stated pt wants to leave the hospital AMA but needs home oxygen.  Faxed the script, face sheet and note proving need for oxygen to [facility] [phone].  Received call from [place] needing more information.  The address on face sheet is an address in [place] and needed a fax number to send another form.  Able to get a current address in [place].  Fax number and address given to [place] by phone [phone].  Received call from [facility], Oxygen is being delivered soon  Has Arrived  Pt under the impression I was trying to get him a ride.  Let him know I don't have any resources and the fact he is leaving AMA he's not eligible for anything the hospital could offer.  Suggested he call a cab.

## 2020-03-20 NOTE — Progress Notes (Signed)
For home O2, must have   1. O2 sat at rest without O2______%  2. O2 sat ambulating without O2______%  3. O2 sat ambulating with O2 on ___L/min  _______%        (Sats without O2 must be less than/equal to 88%.)  Scripts must be written for continuous O2.  Please note: if patient has a resting room air O2 sat of 88-89% AND either dependent edema/CHF or Cor Pulmonale/Pulmonary HTN or HCT >56 they qualify for home oxygen (without doing the exercise testing).

## 2020-03-22 LAB — HEMATOPATHOLOGY REVIEW

## 2020-03-24 LAB — UNMAPPED LAB RESULTS
Basophil # (HT): 0.4 10 3/uL — ABNORMAL HIGH (ref 0.0–0.2)
Basophil % (HT): 1 % (ref 0–2)
Eosinophil # (HT): 0.7 10 3/uL — ABNORMAL HIGH (ref 0.0–0.5)
Eosinophil % (HT): 2 % (ref 0–7)
Hematocrit (HT): 34 % — ABNORMAL LOW (ref 40–52)
Hemoglobin (HGB) (HT): 11.4 g/dL — ABNORMAL LOW (ref 13.0–17.5)
Lymphocyte # (HT): 30.3 10 3/uL — ABNORMAL HIGH (ref 0.9–3.8)
Lymphocyte % (HT): 84 % — ABNORMAL HIGH (ref 17–44)
MCHC (HT): 33.4 g/dL (ref 32.0–36.0)
MCV (HT): 87.4 fL (ref 81.0–99.0)
Mean Corpuscular Hemoglobin (MCH) (HT): 29.2 pg (ref 26.0–34.0)
Monocyte # (HT): 1.1 10 3/uL — ABNORMAL HIGH (ref 0.2–1.0)
Monocyte % (HT): 3 % — ABNORMAL LOW (ref 4–12)
Neutrophil # (HT): 3.6 10 3/uL (ref 1.5–7.7)
Platelets (HT): 251 10 3/uL (ref 140–400)
RBC (HT): 3.9 10 6/uL — ABNORMAL LOW (ref 4.20–5.90)
RDW (HT): 12.8 % (ref 11.5–15.0)
Seg Neut % (HT): 10 % — ABNORMAL LOW (ref 40–75)
WBC (HT): 36.1 10 3/uL — ABNORMAL HIGH (ref 4.0–10.8)

## 2020-03-25 LAB — BLOOD CULTURE
Bacterial Blood Culture: 0
Bacterial Blood Culture: 0

## 2020-04-26 LAB — UNMAPPED LAB RESULTS
Basophil # (HT): 0 10 3/uL (ref 0.0–0.2)
Basophil % (HT): 0 % (ref 0–2)
Eosinophil # (HT): 1.1 10 3/uL — ABNORMAL HIGH (ref 0.0–0.5)
Eosinophil % (HT): 3 % (ref 0–7)
Hematocrit (HT): 36 % — ABNORMAL LOW (ref 40–52)
Hemoglobin (HGB) (HT): 11.9 g/dL — ABNORMAL LOW (ref 13.0–17.5)
Lymphocyte # (HT): 32.9 10 3/uL — ABNORMAL HIGH (ref 0.9–3.8)
Lymphocyte % (HT): 86 % — ABNORMAL HIGH (ref 17–44)
MCHC (HT): 33 g/dL (ref 32.0–36.0)
MCV (HT): 89.8 fL (ref 81.0–99.0)
Mean Corpuscular Hemoglobin (MCH) (HT): 29.6 pg (ref 26.0–34.0)
Monocyte # (HT): 2.7 10 3/uL — ABNORMAL HIGH (ref 0.2–1.0)
Monocyte % (HT): 7 % (ref 4–12)
Neutrophil # (HT): 1.5 10 3/uL (ref 1.5–7.7)
Platelets (HT): 156 10 3/uL (ref 140–400)
RBC (HT): 4.02 10 6/uL — ABNORMAL LOW (ref 4.20–5.90)
RDW (HT): 14 % (ref 11.5–15.0)
Seg Neut % (HT): 4 % — ABNORMAL LOW (ref 40–75)
WBC (HT): 38.2 10 3/uL — ABNORMAL HIGH (ref 4.0–10.8)

## 2020-10-05 LAB — UNMAPPED LAB RESULTS
Basophil # (HT): 0 10 3/uL (ref 0.0–0.2)
Basophil % (HT): 0 % (ref 0–2)
Eosinophil # (HT): 0 10 3/uL (ref 0.0–0.5)
Eosinophil % (HT): 0 % (ref 0–7)
Hematocrit (HT): 30 % — ABNORMAL LOW (ref 40–52)
Hemoglobin (HGB) (HT): 10 g/dL — ABNORMAL LOW (ref 13.0–17.5)
Lymphocyte # (HT): 49 10 3/uL — ABNORMAL HIGH (ref 0.9–3.8)
Lymphocyte % (HT): 91 % — ABNORMAL HIGH (ref 17–44)
MCHC (HT): 33.4 g/dL (ref 32.0–36.0)
MCV (HT): 92.9 fL (ref 81.0–99.0)
Mean Corpuscular Hemoglobin (MCH) (HT): 31.1 pg (ref 26.0–34.0)
Monocyte # (HT): 1.1 10 3/uL — ABNORMAL HIGH (ref 0.2–1.0)
Monocyte % (HT): 2 % — ABNORMAL LOW (ref 4–12)
Neutrophil # (HT): 3.8 10 3/uL (ref 1.5–7.7)
Platelets (HT): 140 10 3/uL (ref 140–400)
RBC (HT): 3.22 10 6/uL — ABNORMAL LOW (ref 4.20–5.90)
RDW (HT): 15 % (ref 11.5–15.0)
Seg Neut % (HT): 7 % — ABNORMAL LOW (ref 40–75)
WBC (HT): 53.9 10 3/uL — ABNORMAL HIGH (ref 4.0–10.8)

## 2020-11-29 LAB — UNMAPPED LAB RESULTS
Basophil # (HT): 0.1 10 3/uL (ref 0.0–0.2)
Basophil % (HT): 0 % (ref 0–2)
Eosinophil # (HT): 0.4 10 3/uL (ref 0.0–0.5)
Eosinophil % (HT): 1 % (ref 0–7)
Hematocrit (HT): 32 % — ABNORMAL LOW (ref 40–52)
Hemoglobin (HGB) (HT): 11 g/dL — ABNORMAL LOW (ref 13.0–17.5)
Lymphocyte # (HT): 46 10 3/uL — ABNORMAL HIGH (ref 0.9–3.8)
Lymphocyte % (HT): 79 % — ABNORMAL HIGH (ref 17–44)
MCHC (HT): 34.5 g/dL (ref 32.0–36.0)
MCV (HT): 94.7 fL (ref 81.0–99.0)
Mean Corpuscular Hemoglobin (MCH) (HT): 32.6 pg (ref 26.0–34.0)
Monocyte # (HT): 5.7 10 3/uL — ABNORMAL HIGH (ref 0.2–1.0)
Monocyte % (HT): 10 % (ref 4–12)
Neutrophil # (HT): 5.5 10 3/uL (ref 1.5–7.7)
Platelets (HT): 172 10 3/uL (ref 140–400)
RBC (HT): 3.37 10 6/uL — ABNORMAL LOW (ref 4.20–5.90)
RDW (HT): 14 % (ref 11.5–15.0)
Seg Neut % (HT): 10 % — ABNORMAL LOW (ref 40–75)
WBC (HT): 57.9 10 3/uL — ABNORMAL HIGH (ref 4.0–10.8)

## 2021-01-09 ENCOUNTER — Telehealth: Payer: Self-pay

## 2021-01-09 NOTE — Telephone Encounter (Signed)
New patient referral received from [place]. Forwarded to Rheum referral coordinator.

## 2021-01-16 ENCOUNTER — Encounter: Payer: Self-pay | Admitting: Internal Medicine

## 2021-01-16 NOTE — Telephone Encounter (Signed)
-------------------------------------------------------------------------------    Summary: appt  -------------------------------------------------------------------------------    Assistance please. SS

## 2021-01-18 NOTE — Telephone Encounter (Signed)
Received referral form. Forwarded to Rheum referral coordinator.

## 2021-02-14 ENCOUNTER — Other Ambulatory Visit (HOSPITAL_COMMUNITY): Payer: Self-pay | Admitting: Family Medicine

## 2021-02-14 ENCOUNTER — Other Ambulatory Visit (HOSPITAL_COMMUNITY): Payer: Self-pay | Admitting: Nurse Practitioner

## 2021-02-14 ENCOUNTER — Other Ambulatory Visit: Payer: Self-pay | Admitting: Family Medicine

## 2021-02-14 DIAGNOSIS — I10 Essential (primary) hypertension: Secondary | ICD-10-CM

## 2021-02-19 ENCOUNTER — Ambulatory Visit (HOSPITAL_COMMUNITY)
Admission: RE | Admit: 2021-02-19 | Discharge: 2021-02-19 | Disposition: A | Discharge status: Home / Self Care | Payer: Medicare Other | Source: Ambulatory Visit | Attending: Nurse Practitioner | Admitting: Nurse Practitioner

## 2021-02-19 ENCOUNTER — Ambulatory Visit (HOSPITAL_COMMUNITY): Payer: Medicare Other

## 2021-02-19 ENCOUNTER — Other Ambulatory Visit: Payer: Self-pay

## 2021-02-19 DIAGNOSIS — I10 Essential (primary) hypertension: Secondary | ICD-10-CM | POA: Insufficient documentation

## 2021-03-05 LAB — UNMAPPED LAB RESULTS
Basophil # (HT): 0.1 10 3/uL (ref 0.0–0.2)
Basophil % (HT): 0 % (ref 0–2)
Eosinophil # (HT): 0.3 10 3/uL (ref 0.0–0.5)
Eosinophil % (HT): 0 % (ref 0–7)
Hematocrit (HT): 33 % — ABNORMAL LOW (ref 40–52)
Hemoglobin (HGB) (HT): 11.1 g/dL — ABNORMAL LOW (ref 13.0–17.5)
Lymphocyte # (HT): 70.7 10 3/uL — ABNORMAL HIGH (ref 0.9–3.8)
Lymphocyte % (HT): 87 % — ABNORMAL HIGH (ref 17–44)
MCHC (HT): 34 g/dL (ref 32.0–36.0)
MCV (HT): 96.2 fL (ref 81.0–99.0)
Mean Corpuscular Hemoglobin (MCH) (HT): 32.7 pg (ref 26.0–34.0)
Monocyte # (HT): 5.5 10 3/uL — ABNORMAL HIGH (ref 0.2–1.0)
Monocyte % (HT): 7 % (ref 4–12)
Neutrophil # (HT): 5.1 10 3/uL (ref 1.5–7.7)
Platelets (HT): 186 10 3/uL (ref 140–400)
RBC (HT): 3.39 10 6/uL — ABNORMAL LOW (ref 4.20–5.90)
RDW (HT): 14.4 % (ref 11.5–15.0)
Seg Neut % (HT): 6 % — ABNORMAL LOW (ref 40–75)
WBC (HT): 81.7 10 3/uL — ABNORMAL HIGH (ref 4.0–10.8)

## 2021-03-06 DIAGNOSIS — I701 Atherosclerosis of renal artery: Secondary | ICD-10-CM

## 2021-03-06 HISTORY — DX: Atherosclerosis of renal artery: I70.1

## 2021-03-07 ENCOUNTER — Ambulatory Visit: Payer: Medicare Other | Admitting: Internal Medicine

## 2021-03-08 NOTE — Progress Notes (Signed)
Office Note     CC: Persistent hypertension with normal bilateral renal artery stenosis Requesting Provider:  John Giovanni, MD  HPI: Mark Cross is a 80 y.o. (10/25/1940) male presenting at the request of .John Giovanni, MD for longstanding, persistent hypertension and recent renal ultrasound demonstrated bilateral renal artery stenosis, superior mesenteric artery stenosis.  Mark Cross presents to clinic today accompanied by his wife.  He notes having persistent hypertension for years that has been poorly controlled with his medication regimen.  A medication was added recently that did slightly improve his systolic blood pressure, however he continues to have a systolic blood pressure greater than 180 mmHg.  Denies symptoms of flash pulmonary edema, and has no history of acute or chronic kidney disease. But also denies history of abdominal pain, postprandial pain, weight loss.  He notes eating smaller meals now, because his appetite is what it once was.  He continues to be active at home living independently with his wife.  Greatest challenge is right leg pain.  The patient has a history of back surgery resulting in sciatica down his right leg.  He uses a walker to ambulate.   The pt is  on a statin for cholesterol management.  The pt is not on a daily aspirin.   Other AC:  - The pt is is on four medications for hypertension.   The pt is not diabetic.  Tobacco hx:  Former  Past Medical History:  Diagnosis Date   Diabetes mellitus without complication (HCC)    Hyperlipidemia    Hypertension     Past Surgical History:  Procedure Laterality Date   INGUINAL HERNIA REPAIR Bilateral 08/10/2012   Procedure: HERNIA REPAIR INGUINAL ADULT;  Surgeon: Marlane Hatcher, MD;  Location: [facility];  Service: General;  Laterality: Bilateral;    Social History   Socioeconomic History   Marital status: Married    Spouse name: Not on file   Number of children: Not on file   Years of education:  Not on file   Highest education level: Not on file  Occupational History   Not on file  Tobacco Use   Smoking status: Former   Smokeless tobacco: Never  Substance and Sexual Activity   Alcohol use: Yes    Comment: seldom   Drug use: No   Sexual activity: Not on file  Other Topics Concern   Not on file  Social History Narrative   Not on file   Social Determinants of Health   Financial Resource Strain: Not on file  Food Insecurity: Not on file  Transportation Needs: Not on file  Physical Activity: Not on file  Stress: Not on file  Social Connections: Not on file  Intimate Partner Violence: Not on file   No family history on file.  Current Outpatient Medications  Medication Sig Dispense Refill   atenolol (TENORMIN) 50 MG tablet Take 50 mg by mouth daily.     hydrochlorothiazide (HYDRODIURIL) 50 MG tablet Take 50 mg by mouth daily.     HYDROcodone-acetaminophen (NORCO/VICODIN) 5-325 MG tablet Take 1 tablet by mouth every 6 (six) hours as needed for moderate pain.     lisinopril (PRINIVIL,ZESTRIL) 10 MG tablet Take 10 mg by mouth daily.     metFORMIN (GLUCOPHAGE) 1000 MG tablet Take 1,000 mg by mouth 2 (two) times daily with a meal.     simvastatin (ZOCOR) 40 MG tablet Take 40 mg by mouth every evening.     verapamil (VERELAN PM) 360 MG 24  hr capsule Take 360 mg by mouth at bedtime.     No current facility-administered medications for this visit.    No Known Allergies   REVIEW OF SYSTEMS:   [X]  denotes positive finding, [ ]  denotes negative finding Cardiac  Comments:  Chest pain or chest pressure:    Shortness of breath upon exertion:    Short of breath when lying flat:    Irregular heart rhythm:        Vascular    Pain in calf, thigh, or hip brought on by ambulation:    Pain in feet at night that wakes you up from your sleep:     Blood clot in your veins:    Leg swelling:         Pulmonary    Oxygen at home:    Productive cough:     Wheezing:          Neurologic    Sudden weakness in arms or legs:     Sudden numbness in arms or legs:     Sudden onset of difficulty speaking or slurred speech:    Temporary loss of vision in one eye:     Problems with dizziness:         Gastrointestinal    Blood in stool:     Vomited blood:         Genitourinary    Burning when urinating:     Blood in urine:        Psychiatric    Major depression:         Hematologic    Bleeding problems:    Problems with blood clotting too easily:        Skin    Rashes or ulcers:        Constitutional    Fever or chills:      PHYSICAL EXAMINATION:  There were no vitals filed for this visit.  General:  WDWN in NAD; vital signs documented above Gait: Not observed HENT: WNL, normocephalic Pulmonary: normal non-labored breathing , without Rales, rhonchi,  wheezing Cardiac: regular HR, Abdomen: soft, NT, no masses Skin: without rashes Vascular Exam/Pulses:  Right Left  Radial 2+ (normal) 2+ (normal)  Ulnar 2+ (normal) 2+ (normal)  Femoral 2+ (normal) 2+ (normal)  Popliteal    DP trace 2+ (normal)  PT trace 2+ (normal)   Extremities: without ischemic changes, without Gangrene , without cellulitis; without open wounds;  Musculoskeletal: no muscle wasting or atrophy  Neurologic: A&O X 3;  No focal weakness or paresthesias are detected Psychiatric:  The pt has Normal affect.   Non-Invasive Vascular Imaging:    +------------------+--------+--------+---------+  Right Renal ArteryPSV cm/sEDV cm/s Comment   +------------------+--------+--------+---------+  Origin              365      31              +------------------+--------+--------+---------+  Proximal            200      14   turbulent  +------------------+--------+--------+---------+  Mid                 125      21              +------------------+--------+--------+---------+  Distal              123      16               +------------------+--------+--------+---------+   +-----------------+--------+--------+---------+  Left Renal ArteryPSV cm/sEDV cm/s Comment   +-----------------+--------+--------+---------+  Origin             434      26              +-----------------+--------+--------+---------+  Proximal           258      25   turbulent  +-----------------+--------+--------+---------+  Mid                268      27              +-----------------+--------+--------+---------+  Distal              81      14              +-----------------+--------+--------+---------+   +------------+--------+--------+----+-----------+--------+--------+----+  Right KidneyPSV cm/sEDV cm/sRI  Left KidneyPSV cm/sEDV cm/sRI    +------------+--------+--------+----+-----------+--------+--------+----+  Upper Pole  39      6       0.85Upper Pole 34      6       0.82  +------------+--------+--------+----+-----------+--------+--------+----+  Mid         23      6       0.        53      7       0.87  +------------+--------+--------+----+-----------+--------+--------+----+  Lower Pole  23      7       0.68Lower Pole 56      7       0.88  +------------+--------+--------+----+-----------+--------+--------+----+  Hilar       108     21      0.81Hilar      102     17      0.83  +------------+--------+--------+----+-----------+--------+--------+----+   +------------------+-----+------------------+-----+  Right Kidney           Left Kidney              +------------------+-----+------------------+-----+  RAR                    RAR                      +------------------+-----+------------------+-----+  RAR (manual)      4.56 RAR (manual)      5.43   +------------------+-----+------------------+-----+  Cortex                 Cortex                   +------------------+-----+------------------+-----+  Cortex thickness        Corex thickness          +------------------+-----+------------------+-----+  Kidney length (cm)10.60Kidney length (cm)10.19  +------------------+-----+------------------+-----+      Summary:  Renal:     Right: Evidence of a > 60% stenosis of the right renal artery.         Abnormal right Resistive Index. RRV flow present. Cyst(s)         noted. Normal size right kidney.  Left:  Evidence of a > 60% stenosis in the left renal artery.         Abnormal left Resisitve Index. LRV flow present. Cyst(s)         noted. Normal size of left kidney.  Mesenteric:  70 to 99% stenosis in the superior mesenteric artery.     ASSESSMENT/PLAN: Mark Cross is a 80 y.o.  male presenting with imaging findings illustrating superior mesenteric artery stenosis, bilateral renal artery stenosis.  Selestino has no abdominal pain or postprandial symptoms concerning for chronic mesenteric ishemia.  Regarding his bilateral renal artery stenosis, Mark Cross is currently on lisinopril atenolol verapamil and hydrochlorothiazide to control his blood pressure. These medications are not at their maximum dosages. He does not have any renal insufficiency at this time.  He has no history of flash pulmonary edema.    The CORAL trial from 2014 demonstrated that renal artery stenting does not improve renal or cardiac clinical outcomes in patients with hypertension or chronic kidney disease due to atherosclerotic renal artery stenosis.  Systolic pressure did not significantly differ between the groups, with the stented group having only -2 mmHg SBP lowering.   Should the patient's hypertension be uncontrolled on 5 agents, he have acute renal insufficiency, or flash pulmonary edema episode, I would be happy to offer bilateral renal artery stenting. At this point however, he would benefit from optimal medical management and increased antihypertensive medication dosages.  I was unable to palpate a pulse in the right foot.  I have  ordered an ABI, and will call the patient with the results.    Mark Sparrow, MD [facility] [phone]

## 2021-03-09 ENCOUNTER — Other Ambulatory Visit: Payer: Self-pay

## 2021-03-09 ENCOUNTER — Encounter: Payer: Self-pay | Admitting: Vascular Surgery

## 2021-03-09 ENCOUNTER — Ambulatory Visit: Payer: Medicare Other | Admitting: Vascular Surgery

## 2021-03-09 VITALS — BP 221/70 | HR 53 | Temp 97.9°F | Resp 20 | Ht 67.5 in | Wt 137.0 lb

## 2021-03-09 DIAGNOSIS — I701 Atherosclerosis of renal artery: Secondary | ICD-10-CM

## 2021-03-09 DIAGNOSIS — I739 Peripheral vascular disease, unspecified: Secondary | ICD-10-CM

## 2021-03-14 ENCOUNTER — Other Ambulatory Visit: Payer: Self-pay

## 2021-03-14 ENCOUNTER — Ambulatory Visit (HOSPITAL_COMMUNITY)
Admission: RE | Admit: 2021-03-14 | Discharge: 2021-03-14 | Disposition: A | Discharge status: Home / Self Care | Payer: Medicare Other | Source: Ambulatory Visit | Attending: Vascular Surgery | Admitting: Vascular Surgery

## 2021-03-14 DIAGNOSIS — I739 Peripheral vascular disease, unspecified: Secondary | ICD-10-CM

## 2021-03-14 HISTORY — DX: Peripheral vascular disease, unspecified: I73.9

## 2021-04-02 LAB — UNMAPPED LAB RESULTS
Basophil # (HT): 0 10 3/uL (ref 0.0–0.2)
Basophil % (HT): 0 % (ref 0–3)
Eosinophil # (HT): 0 10 3/uL (ref 0.0–0.6)
Eosinophil % (HT): 0 % (ref 0–5)
Hematocrit (HT): 29 % — ABNORMAL LOW (ref 40–52)
Hemoglobin (HGB) (HT): 9.1 g/dL — ABNORMAL LOW (ref 13.0–18.0)
Lymphocyte # (HT): 60.2 10 3/uL — ABNORMAL HIGH (ref 1.0–4.8)
Lymphocyte % (HT): 97 % — ABNORMAL HIGH (ref 15–45)
MCHC (HT): 31.7 g/dL — ABNORMAL LOW (ref 32.0–37.5)
MCV (HT): 103 fL — ABNORMAL HIGH (ref 80–100)
Mean Corpuscular Hemoglobin (MCH) (HT): 32.7 pg (ref 26.0–34.0)
Monocyte # (HT): 0.6 10 3/uL (ref 0.1–1.0)
Monocyte % (HT): 1 % (ref 0–15)
Neutrophil # (HT): 1.2 10 3/uL — ABNORMAL LOW (ref 1.8–8.0)
Platelets (HT): 158 10 3/uL (ref 150–450)
RBC (HT): 2.78 10 6/uL — ABNORMAL LOW (ref 4.40–6.20)
RDW (HT): 15.3 % — ABNORMAL HIGH (ref 0.0–15.2)
Seg Neut % (HT): 2 % — ABNORMAL LOW (ref 45–75)
WBC (HT): 62.1 10 3/uL — ABNORMAL HIGH (ref 4.0–11.0)

## 2021-04-03 NOTE — Progress Notes (Signed)
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  He scheduled an earlier  appointment to discuss new concerns.  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 He however developed left facial palsy.  He complaints of severe pain and hearing issues in his left ear. He tells me that he has been taking 3000 mg of gabapentin at bedtime to control his pain and also ibuprofen.  He tells me that he has had COVID19 like symptoms since September, with fatigue and abdominal discomfort but he had 3 negative home tests. These symptoms eventually resolved.  He has not contacted his PCP regarding any of these issues.    ROS as per history of present illness otherwise negative.    ALLERGIES:    Reviewed in EMR and Updated if necessary.     CURRENT MEDICATIONS:    Reviewed in EMR and Updated if necessary.     PAST MEDICAL/SURGICAL/SOCIAL/FAMILY HISTORY:    Reviewed in EMR and Updated if necessary.      PHYSICAL EXAMINATION:   GENERAL: Patient is well appearing and is not in acute distress.   ECOG performance status is 1  VITAL SIGNS: Reviewed in EMR.     Vitals:    04/02/21 1253   BP: (!) 199/88   Pulse: 65   Temp: 36.7 C (98.1 F)   Weight: 74.7 kg (164 lb 11.2 oz)     HEENT: Normocephalic and atraumatic. One  erosion of about 3/4 mm in the left preauricular area.  NODES: No palpable adenopathy in the supraclavicular, cervical or axillary region bilaterally.   LUNGS: Clear to auscultation bilaterally.    CVS: Regular heart sounds.   ABDOMEN: Soft, non-distended and non-tender. Bowel sounds positive. There is no palpable hepatosplenomegaly.   CNS: AAO x 3. No gross motor deficit. Speech is normal. Gait is normal.    EXTRIMITIES: No pedal edema.   MUSCULOSKELETAL: No palpable spine, rib or hip tenderness.   SKIN: No rashes.   PSYCHOSOCIAL: Mood, judgement, affect and insight are intact.     LABORATORY DATA:     Lab Results   Component Value Date    WBCR 62.1 (H) 04/02/2021    HGB 9.1 (L) 04/02/2021    HCT 29 (L) 04/02/2021    MCV 103 (H) 04/02/2021    PLTR 158 04/02/2021    NEUAR 1.2 (L) 04/02/2021     Lab Results   Component Value Date    GLU 88 04/02/2021    CA 8.8 04/02/2021  ALB 4.4 04/02/2021    TPRP 5.9 04/02/2021    NA 144 04/02/2021    K 4.1 04/02/2021    CL 108 04/02/2021    BUN 27 (H) 04/02/2021    CREAT 1.6 (H) 04/02/2021    ALK 64 04/02/2021    ALT 23 04/02/2021    AST 18 04/02/2021    TBIL 0.4 04/02/2021     Lab Results   Component Value Date    LDH 234 04/02/2021     No results found for: URIC, PHOS  Lab Results   Component Value Date    INR 1.1 11/09/2012    PTI 11.5 11/09/2012    PTT 23.3 11/09/2012     RADIOLOGY DATA:     ASSESSMENT AND PLAN:   Larry Choi is a 80 y.o. male with leukocytosis/lymphocytosis most consistent with CLL, with deletion 13.    Multiple aspects of his care were discussed today.  - Regarding his concerns for increased WBC/ALC, I discussed that is an expected trend of his CLL. His numbers have indeed increased since last year but remain in the level 60K, with no doubling in the last 6 months.  - I discussed worsening anemia and development of neutropenia. His Hb has fluctuated in the past and correlates with his worsening renal function which is likely the case this time also. The  extended course of Valtrex might contribute to his renal dysfunction together with ibuprofen and also to his anemia and neutropenia. I recommended today that he stops both valtrex and ibuprofen. I asked him to contact his PCP for further pain management of his neuropathic pain.  - Regarding his left facial paresis, I recommended that he has brain MRI ASAP. I discussed that CLL is not likely the cause of his CNS disease but possible. I discussed other most likely causes of this issues like stroke, which is not acute given his symptoms since September, Lyme disease, mass. He will address work up for non malignant etiology with PCP.  - Regarding his concerns that he has had recurrent relapses of singles, I am not certain about that. I again recommended him to consider singles vaccin which he has not pursued since I last advised in June 2022.   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 He has not had repeated sinopulmonary infections which is the main indication for IVIG but I think a consideration can be given to supportive therapy.    He will have repeat labs CBC/Diff and CMP in the next 2-3 weeks and if there is no improvement of his labs, will proceed with a CT CAP in consideration of CLL specific therapy.        Patient was instructed to contact me with questions or concerns.      Kinnie Scales, MD  Electronically signed on 04/03/2021 at 4:49 PM

## 2021-04-05 ENCOUNTER — Other Ambulatory Visit: Payer: Self-pay | Admitting: Hematology and Oncology

## 2021-04-05 DIAGNOSIS — G510 Bell's palsy: Secondary | ICD-10-CM

## 2021-04-12 ENCOUNTER — Ambulatory Visit
Admission: RE | Admit: 2021-04-12 | Discharge: 2021-04-12 | Disposition: A | Discharge status: Home / Self Care | Payer: Medicare Other | Source: Ambulatory Visit | Attending: Hematology and Oncology | Admitting: Hematology and Oncology

## 2021-04-12 ENCOUNTER — Other Ambulatory Visit: Payer: Self-pay

## 2021-04-12 DIAGNOSIS — G510 Bell's palsy: Secondary | ICD-10-CM | POA: Insufficient documentation

## 2021-04-12 DIAGNOSIS — G319 Degenerative disease of nervous system, unspecified: Secondary | ICD-10-CM

## 2021-04-12 MED ORDER — GADOTERIDOL 279.3 MG/ML (PROHANCE) IV SOLN *I*
15.0000 mL | Freq: Once | INTRAVENOUS | Status: AC
Start: 2021-04-12 — End: 2021-04-12
  Administered 2021-04-12: 15 mL via INTRAVENOUS

## 2021-04-13 LAB — UNMAPPED LAB RESULTS
Basophil # (HT): 0 10 3/uL (ref 0.0–0.2)
Basophil % (HT): 0 % (ref 0–2)
Eosinophil # (HT): 0 10 3/uL (ref 0.0–0.5)
Eosinophil % (HT): 0 % (ref 0–7)
Hematocrit (HT): 30 % — ABNORMAL LOW (ref 40–52)
Hemoglobin (HGB) (HT): 9.7 g/dL — ABNORMAL LOW (ref 13.0–17.5)
Lymphocyte # (HT): 69 10 3/uL — ABNORMAL HIGH (ref 0.9–3.8)
Lymphocyte % (HT): 92 % — ABNORMAL HIGH (ref 17–44)
MCHC (HT): 32.6 g/dL (ref 32.0–36.0)
MCV (HT): 96.4 fL (ref 81.0–99.0)
Mean Corpuscular Hemoglobin (MCH) (HT): 31.4 pg (ref 26.0–34.0)
Monocyte # (HT): 3 10 3/uL — ABNORMAL HIGH (ref 0.2–1.0)
Monocyte % (HT): 4 % (ref 4–12)
Neutrophil # (HT): 3 10 3/uL (ref 1.5–7.7)
Platelets (HT): 178 10 3/uL (ref 140–400)
RBC (HT): 3.09 10 6/uL — ABNORMAL LOW (ref 4.20–5.90)
RDW (HT): 14 % (ref 11.5–15.0)
Seg Neut % (HT): 4 % — ABNORMAL LOW (ref 40–75)
WBC (HT): 75 10 3/uL — ABNORMAL HIGH (ref 4.0–10.8)

## 2021-04-17 ENCOUNTER — Other Ambulatory Visit: Payer: Self-pay

## 2021-04-18 ENCOUNTER — Telehealth: Payer: Self-pay | Admitting: Otolaryngology

## 2021-04-18 NOTE — Telephone Encounter (Signed)
Referral requesting Dr. Delene Loll received.  Will speak with Dr. Delene Loll.

## 2021-04-18 NOTE — Telephone Encounter (Signed)
Attempted to contact the patient '[phone]' and '[phone]' and both numbers were out of service. Called '[phone]' and LVM for a return call.     DX is Hearing loss. No audio needed.

## 2021-04-23 LAB — UNMAPPED LAB RESULTS
Basophil # (HT): 0 10 3/uL (ref 0.0–0.2)
Basophil % (HT): 0 % (ref 0–2)
Eosinophil # (HT): 0 10 3/uL (ref 0.0–0.5)
Eosinophil % (HT): 0 % (ref 0–7)
Hematocrit (HT): 32 % — ABNORMAL LOW (ref 40–52)
Hemoglobin (HGB) (HT): 10.2 g/dL — ABNORMAL LOW (ref 13.0–17.5)
Lymphocyte # (HT): 111.8 10 3/uL — ABNORMAL HIGH (ref 0.9–3.8)
Lymphocyte % (HT): 96 % — ABNORMAL HIGH (ref 17–44)
MCHC (HT): 32.3 g/dL (ref 32.0–36.0)
MCV (HT): 98.4 fL (ref 81.0–99.0)
Mean Corpuscular Hemoglobin (MCH) (HT): 31.8 pg (ref 26.0–34.0)
Monocyte # (HT): 2.3 10 3/uL — ABNORMAL HIGH (ref 0.2–1.0)
Monocyte % (HT): 2 % — ABNORMAL LOW (ref 4–12)
Neutrophil # (HT): 2.3 10 3/uL (ref 1.5–7.7)
Platelets (HT): 204 10 3/uL (ref 140–400)
RBC (HT): 3.21 10 6/uL — ABNORMAL LOW (ref 4.20–5.90)
RDW (HT): 13.8 % (ref 11.5–15.0)
Seg Neut % (HT): 2 % — ABNORMAL LOW (ref 40–75)
WBC (HT): 116.5 10 3/uL (ref 4.0–10.8)

## 2021-05-14 ENCOUNTER — Other Ambulatory Visit: Payer: Self-pay

## 2021-06-02 IMAGING — CR DG MYELOGRAPHY LUMBAR INJ LUMBOSACRAL
13 of 18 series · 13 of 18 positions shown · non-contrast
Comparison: MRI lumbar spine dated September 16, 2018.

CLINICAL DATA: Chronic low back pain radiating into the right
buttock and leg. Minimal relief from recent epidural injections. No
prior surgery.
TECHNIQUE: Contiguous axial images were obtained through the lumbar spine after
the intrathecal infusion of contrast. Coronal and sagittal
reconstructions were obtained of the axial image sets.

[w lumbar spine lat]
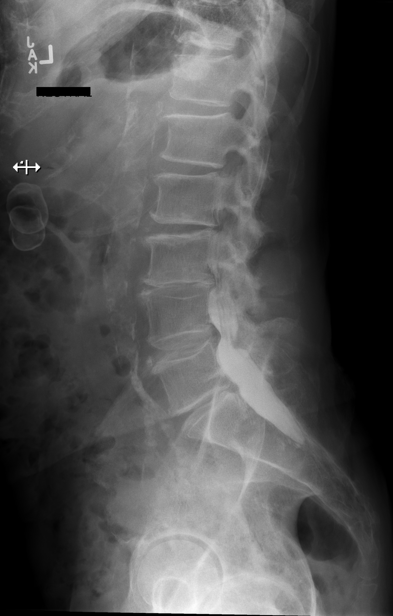

[w lumbar spine flexion]
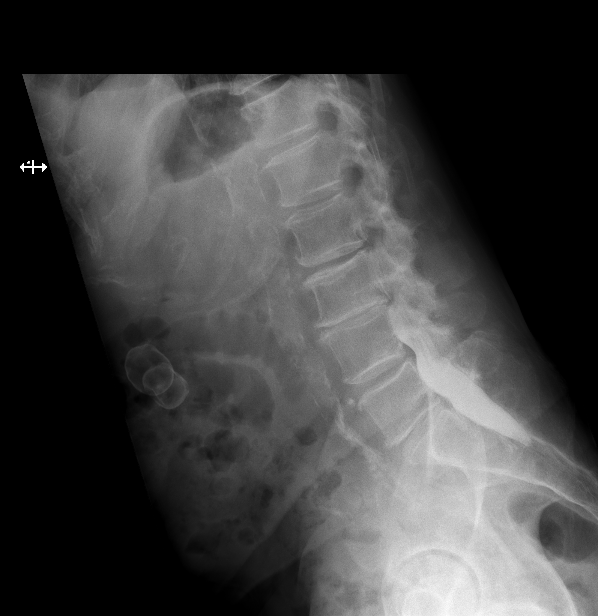

[vasc standard (1 of 11)]
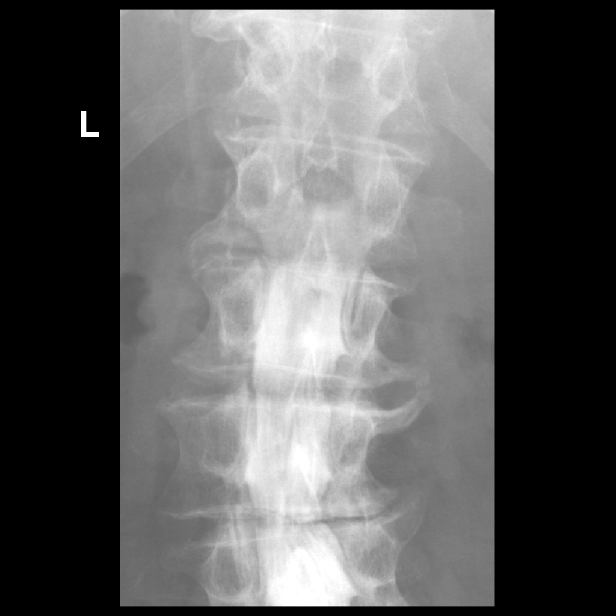

[vasc standard (2 of 11)]
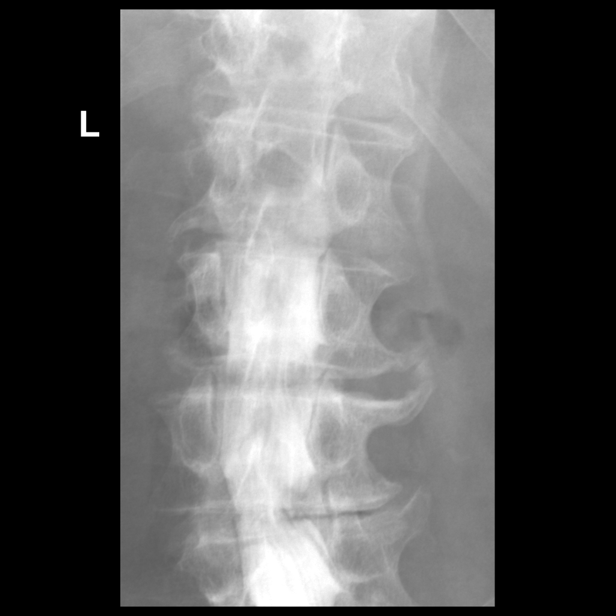

[vasc standard (3 of 11)]
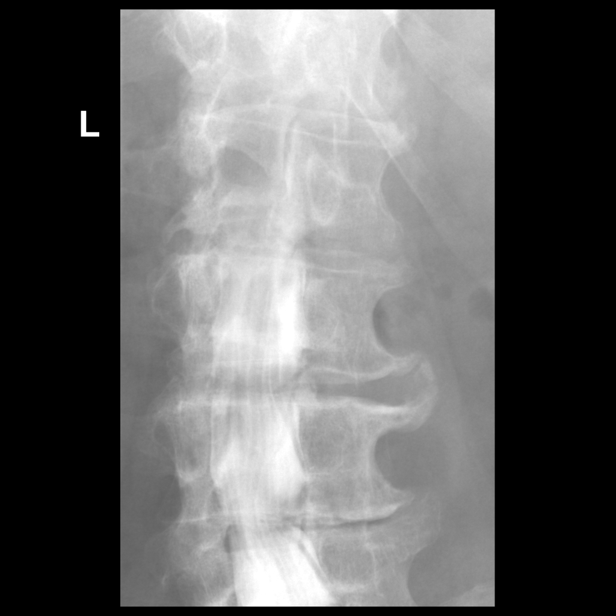

[vasc standard (4 of 11)]
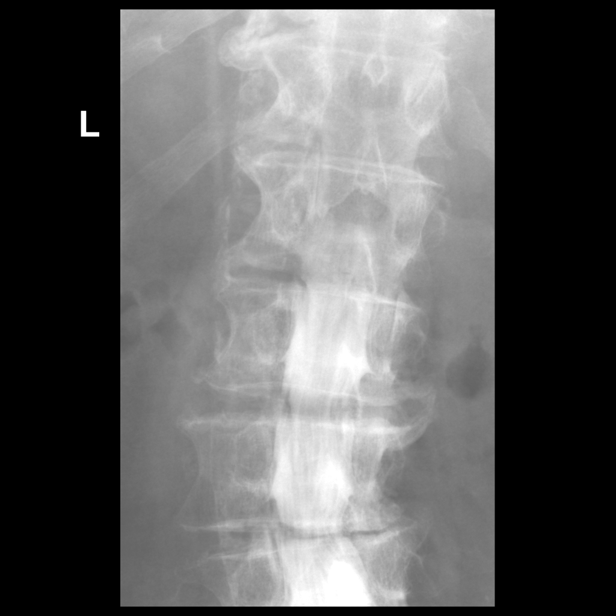

[vasc standard (5 of 11)]
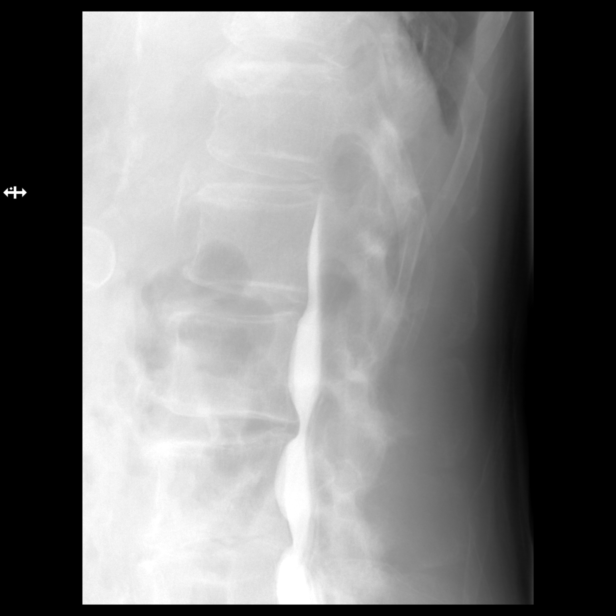

[vasc standard (6 of 11)]
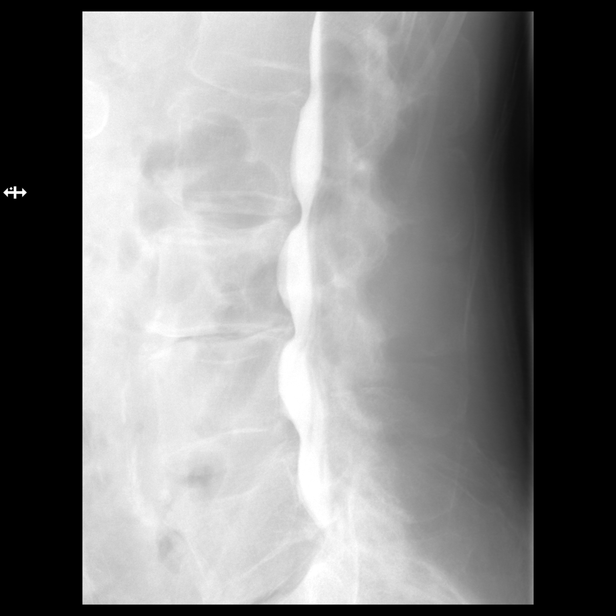

[vasc standard (7 of 11)]
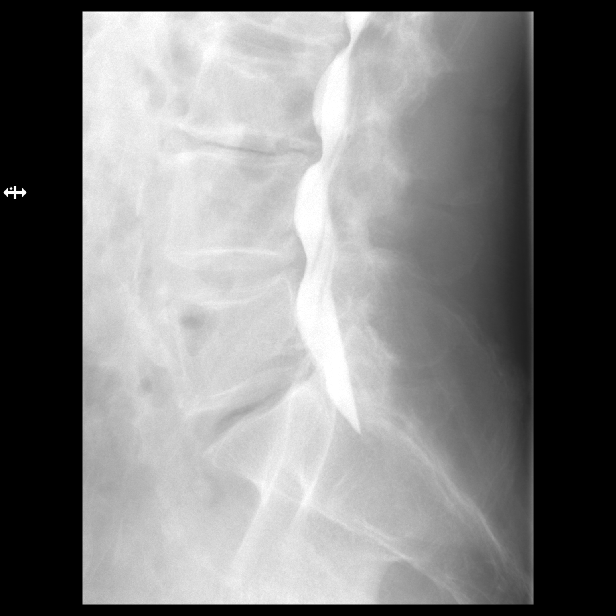

[vasc standard (8 of 11)]
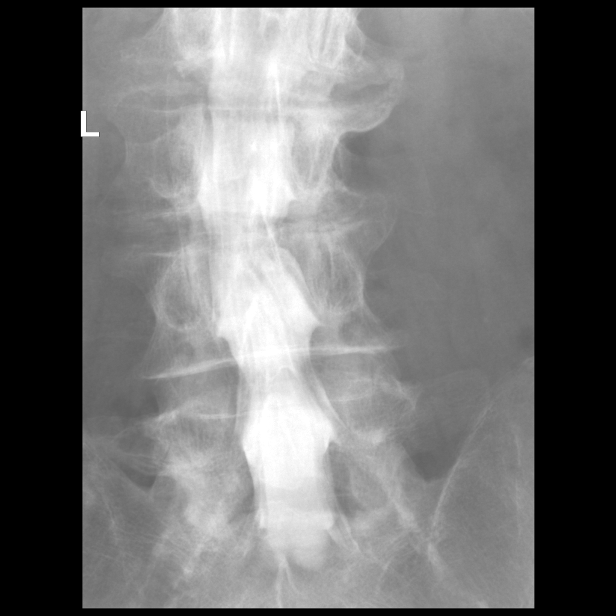

[vasc standard (9 of 11)]
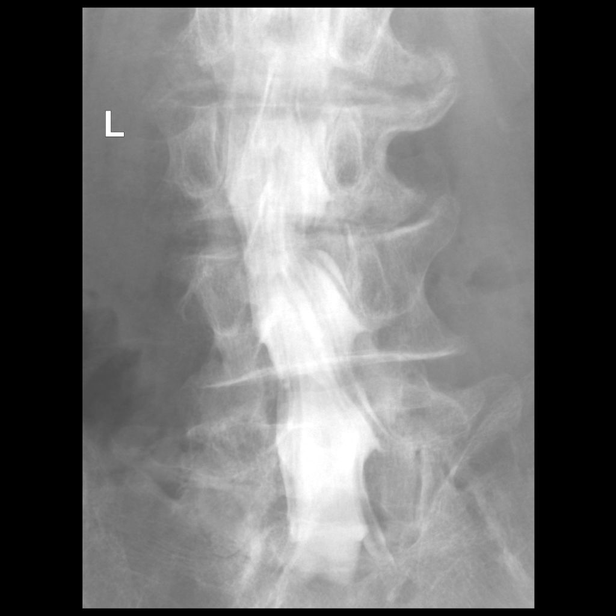

[vasc standard (10 of 11)]
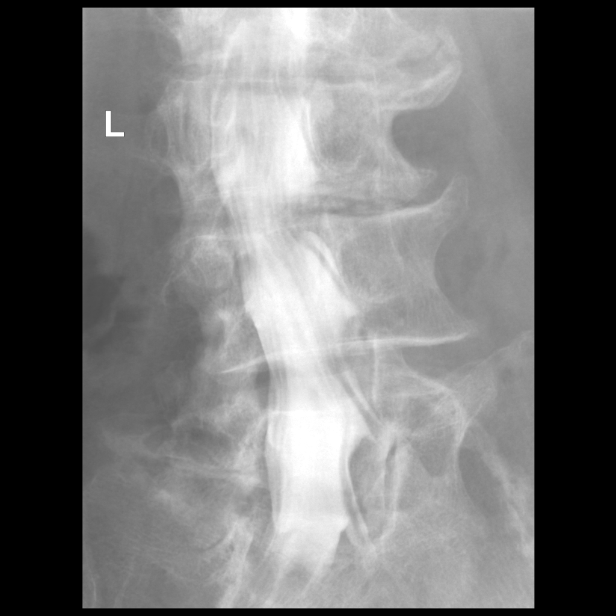

[vasc standard (11 of 11)]
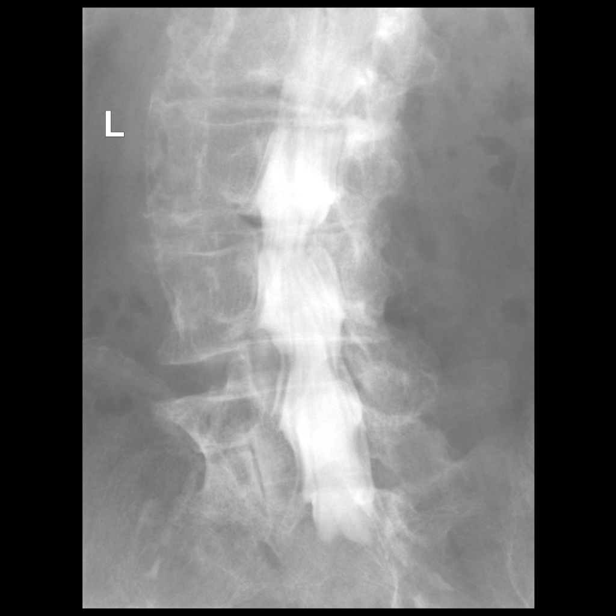

[13 of 18 positions shown; findings below may reference images not displayed]

EXAM:
LUMBAR MYELOGRAM

CT LUMBAR MYELOGRAM

FLUOROSCOPY TIME:  Radiation Exposure Index (as provided by the
fluoroscopic device): 11.6 mGy

Fluoroscopy Time:  32 seconds

Number of Acquired Images:  17

PROCEDURE:
After thorough discussion of risks and benefits of the procedure
including bleeding, infection, injury to nerves, blood vessels,
adjacent structures as well as headache and CSF leak, written and
oral informed consent was obtained. Consent was obtained by Dr.
Asiel [place]. Time out form was completed.

Patient was positioned prone on the fluoroscopy table. Local
anesthesia was provided with 1% lidocaine without epinephrine after
prepped and draped in the usual sterile fashion. Puncture was
performed at L4-L5 using a 3 1/2 inch 22-gauge spinal needle via
right interlaminar approach. Using a single pass through the dura,
the needle was placed within the thecal sac, with return of clear
CSF. 15 mL of Isovue 6-RBB was injected into the thecal sac, with
normal opacification of the nerve roots and cauda equina consistent
with free flow within the subarachnoid space.

I personally performed the lumbar puncture and administered the
intrathecal contrast. I also personally supervised acquisition of
the myelogram images.
FINDINGS: LUMBAR MYELOGRAM FINDINGS:

Mild levoscoliosis. Trace retrolisthesis at L3-L4. No dynamic
instability. Small ventral extradural defects from L2-L3 through
L5-S1 which slightly worsen with standing and extension. Mild spinal
canal stenosis at L2-L3 and L3-L4. Moderate lateral recess stenosis
at L3-L4. Underfilling of the right L3 and L4 nerve roots. Large
gallstones noted.

CT LUMBAR MYELOGRAM FINDINGS:

Segmentation: Standard.

Alignment: Unchanged mild levoscoliosis and trace retrolisthesis at
L3-L4.

Vertebrae: No acute fracture or other focal pathologic process.

Conus medullaris and cauda equina: Conus extends to the L3 level.
Conus and cauda equina appear normal.

Paraspinal and other soft tissues: Large gallstones. Partially
visualized 8 mm low-density lesion in the central liver, too small
to characterize, but likely a cyst. 1.1 cm left adrenal adenoma.
Small bilateral renal cysts. Extensive colonic diverticulosis.
Aortoiliac atherosclerotic vascular disease.

Disc levels:

T12-L1: Unchanged mild disc bulging and mild bilateral facet
arthropathy. No stenosis.

L1-L2: Unchanged mild disc bulging, endplate spurring, and bilateral
facet arthropathy. No stenosis.

L2-L3: Unchanged moderate disc bulging eccentric to the right.
Unchanged moderate right and mild left facet arthropathy. Unchanged
mild spinal canal stenosis. Slightly progressive mild to moderate
right lateral recess stenosis. No neuroforaminal stenosis.

L3-L4: Mildly progressive right eccentric diffuse disc bulging and
endplate spurring. Unchanged moderate right and mild left facet
arthropathy. Unchanged mild spinal canal stenosis. Worsened moderate
to severe right lateral recess and neuroforaminal stenosis. No left
neuroforaminal stenosis.

L4-L5: Unchanged mild left eccentric diffuse disc bulging and
endplate spurring. Unchanged mild bilateral facet arthropathy.
Unchanged moderate left neuroforaminal stenosis. No spinal canal or
right neuroforaminal stenosis.

L5-S1: Unchanged mild left eccentric diffuse disc bulging and
endplate spurring. Unchanged moderate left and mild right facet
arthropathy. Unchanged moderate to severe left neuroforaminal
stenosis. No spinal canal or right neuroforaminal stenosis.
IMPRESSION: 1. Mildly progressive multilevel lumbar spondylosis as described
above. Worsened moderate to severe right lateral recess and
neuroforaminal stenosis at L3-L4.
2. Slightly progressive mild to moderate right lateral recess
stenosis at L2-L3.
3. Unchanged moderate L4-L5 and moderate to severe L5-S1 left
neuroforaminal stenosis.
4. Cholelithiasis and left adrenal adenoma.
5. Aortic Atherosclerosis ([id number]).

## 2021-06-27 LAB — UNMAPPED LAB RESULTS
Basophil # (HT): 0.1 10 3/uL (ref 0.0–0.2)
Basophil % (HT): 0 % (ref 0–2)
Eosinophil # (HT): 0.1 10 3/uL (ref 0.0–0.5)
Eosinophil % (HT): 0 % (ref 0–7)
Hematocrit (HT): 33 % — ABNORMAL LOW (ref 40–52)
Hemoglobin (HGB) (HT): 10.7 g/dL — ABNORMAL LOW (ref 13.0–17.5)
Lymphocyte # (HT): 86.9 10 3/uL — ABNORMAL HIGH (ref 0.9–3.8)
Lymphocyte % (HT): 89 % — ABNORMAL HIGH (ref 17–44)
MCHC (HT): 32.4 g/dL (ref 32.0–36.0)
MCV (HT): 97.9 fL (ref 81.0–99.0)
Mean Corpuscular Hemoglobin (MCH) (HT): 31.8 pg (ref 26.0–34.0)
Monocyte # (HT): 6.5 10 3/uL — ABNORMAL HIGH (ref 0.2–1.0)
Monocyte % (HT): 7 % (ref 4–12)
Neutrophil # (HT): 3.9 10 3/uL (ref 1.5–7.7)
Platelets (HT): 153 10 3/uL (ref 140–400)
RBC (HT): 3.37 10 6/uL — ABNORMAL LOW (ref 4.20–5.90)
RDW (HT): 13.6 % (ref 11.5–15.0)
Seg Neut % (HT): 4 % — ABNORMAL LOW (ref 40–75)
WBC (HT): 97.6 10 3/uL — ABNORMAL HIGH (ref 4.0–10.8)

## 2021-07-06 ENCOUNTER — Other Ambulatory Visit: Payer: Self-pay

## 2021-07-06 ENCOUNTER — Ambulatory Visit: Payer: Medicare Other | Admitting: Cardiology

## 2021-07-06 ENCOUNTER — Encounter: Payer: Self-pay | Admitting: Cardiology

## 2021-07-06 VITALS — BP 150/60 | HR 59 | Ht 67.5 in | Wt 133.2 lb

## 2021-07-06 DIAGNOSIS — Z01810 Encounter for preprocedural cardiovascular examination: Secondary | ICD-10-CM | POA: Diagnosis not present

## 2021-07-06 DIAGNOSIS — R011 Cardiac murmur, unspecified: Secondary | ICD-10-CM | POA: Insufficient documentation

## 2021-07-06 DIAGNOSIS — E1169 Type 2 diabetes mellitus with other specified complication: Secondary | ICD-10-CM

## 2021-07-06 DIAGNOSIS — I452 Bifascicular block: Secondary | ICD-10-CM

## 2021-07-06 DIAGNOSIS — E785 Hyperlipidemia, unspecified: Secondary | ICD-10-CM

## 2021-07-06 DIAGNOSIS — I10 Essential (primary) hypertension: Secondary | ICD-10-CM | POA: Diagnosis not present

## 2021-07-06 NOTE — Progress Notes (Signed)
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 Christia Reading D. Percell Miller)   ===================================  ASSESSMENT/PLAN   Problem List Items Addressed This Visit       Cardiology Problems   Hyperlipidemia associated with type 2 diabetes mellitus ([place]) (Chronic)    He is on simvastatin.  Unfortunately do not have labs available, but has been managed by PCP.  Diabetes is managed with metformin.  Also do not have labs available.      Relevant Medications   hydrALAZINE (APRESOLINE) 50 MG tablet   Bifascicular bundle branch block (Chronic)    Bifascicular block on EKG is not new for him, but it is a "abnormal finding ".  This in conjunction with the fact that he has a murmur with resistant hypertension warrants evaluation with 2D echocardiogram.  Would like to make sure that there is no evidence of any structural or wall motion abnormality.  He is on 2 AV nodal agents with out signs of chronotropic incompetence.  Plan: Check 2D echo      Relevant Medications   hydrALAZINE (APRESOLINE) 50 MG tablet   Other Relevant Orders   EKG 12-Lead   ECHOCARDIOGRAM COMPLETE   Resistant hypertension (Chronic)    On multiple medications.  I updated his list to include hydralazine 50 mg 3 times daily because of not listed on our med list.  He has room to titrate lisinopril further, but not the atenolol for verapamil, however if we converted from verapamil to amlodipine, we could potentially increase beta-blocker. He is on 50 mg of hydrochlorothiazide -> better data with 50 mg chlorthalidone (can address postop)  For now, his pressures seem to be pretty well controlled.   Perioperatively, would potentially use hydralazine to higher doses to address increased pressures.      Relevant Medications   hydrALAZINE (APRESOLINE) 50 MG tablet   Other Relevant Orders   EKG 12-Lead   ECHOCARDIOGRAM COMPLETE     Other   Preop cardiovascular exam - Primary    81 year old gentleman with pending hip surgery who has no active cardiac symptoms.  No active angina or CHF symptoms and no arrhythmias.  He has exertional dyspnea mostly because of hip pain and therefore cannot do 4 METS.  However, in the absence of any ongoing symptoms, would not want any further testing them EKG already done.  Because of this EKG abnormal we are checking 2D echocardiogram just to get a baseline cardiac function and valvular assessment.  PREOPERATIVE CARDIAC RISK ASSESSMENT   Revised Cardiac Risk Index: High Risk Surgery: no; (intermediate = Hip Arthoplasty) Defined as Intraperitoneal, intrathoracic or suprainguinal vascular Active CAD: no; no angina CHF: no; no PND/orthopnea or edema Cerebrovascular Disease: no; no TIA or CVA Hx Diabetes: yes; On Insulin: no CKD (Cr >~ 2): NO  Total: 0-1 Estimated Risk of Adverse Outcome: Class I; with his primary risk being his age. Estimated Risk of MI, PE, VF/VT (Cardiac Arrest), Complete Heart Block: 1-3% with risk being mitigated by long-term beta-blocker (0.5-1.5%)   ACC/AHA Guidelines for "Clearance": Step 1 - Need for Emergency Surgery: No:  If Yes - go straight to OR with perioperative surveillance Step 2 - Active Cardiac Conditions (Unstable  Angina, Decompensated HF, Significant  Arrhytmias - Complete HB, Mobitz II, Symptomatic VT or SVT, Severe Aortic Stenosis - mean gradient > 40 mmHg, Valve area < 1.0 cm2):  No:  If Yes - Evaluate & Treat per ACC/AHA Guidelines Step 3 -  Low Risk Surgery: No: INTERMEDIATE If Yes --> proceed to OR If No --> Step 4 Step 4 - Functional Capacity >= 4 METS without symptoms: No: limited by Hip pain. If Yes  --> proceed to OR If No --> Step 5 Step 5 --  Clinical Risk Factors (CRF)  3 or more: No:  If Yes -- assess Surgical Risk, --  Intermediate Risk: Proceed to OR with HR control, or consider testing if it will change management 1-2 or more CRFs: No:  If Yes -- assess Surgical Risk, --  (High Risk Non-cardiac), Intraabdominal or thoracic vascular surgery --> Proceed to OR, or consider testing if it will change management. Intermediate Risk: Proceed to OR with HR control, or consider testing if it will change management No CRFs: Yes - as noted above.  If Yes --> Proceed to OR  In the absence of active anginal or CHF symptoms, and no arrhythmias, there is no indication for further testing.  It would not change management as we would not routinely consider ischemic evaluation.  With significant resistant hypertension, abnormal EKG and murmur exam, we are checking a 2D echocardiogram to get an assessment of his EF and valvular disease.  Pending results of his echocardiogram, I anticipate that she will proceed to the OR without any further testing.  He is almost relatively stable medication his blood pressures are fairly well controlled.    Would be reluctant to titrate ACE inhibitor perioperatively.  Would simply use higher dose of hydralazine if necessary.  Further assessment pending results of echocardiogram.  We will forward results and recommendation to Dr. Eulah Pont and PCP Team.        Relevant Orders   EKG 12-Lead   ECHOCARDIOGRAM COMPLETE   Systolic murmur    Systolic ejection murmur this is probably consistent with aortic stenosis or sclerosis.  He has not had any previous evaluation, will therefore evaluate with a 2D echocardiogram just to exclude significant valvular disease.  He is relatively asymptomatic and therefore I would not expect it to be significant.      Relevant Orders   EKG 12-Lead   ECHOCARDIOGRAM COMPLETE    ===================================  HPI:    Mark Cross is a 81 y.o. male with a longstanding history of Hypertension (poorly controlled), PAD (bilateral RAS and SMA stenosis) who is being seen today for t PREOP CARDIOVASCULAR EVALUATION at the request of John Giovanni, MD, and Andree Coss, MD  Seen by Dr. Nolon Bussing from Vascular Surgery in November 2022 for evaluation of bilateral RAS and SMA stenosis given poorly controlled hypertension.  Recommended to continue medical management as he did not meet criteria based on CORAL Trial.  ABIs ordered to evaluate right foot pain; BP was 221/70.  He was seen by Jayme Cloud, NP on June 01, 2021: This is a follow-up visit for blood pressure evaluation.  At this time he was taking hydralazine 25 mg 3 times daily for blood pressure.  (We have increased from 10 mg TID).  The noted some improvement in blood pressure.  Normal ranges been in the 150 to 160 mm range. => Noted chronic hip and back pain. => Continued atenolol 50 mg daily, HCTZ 50 mg daily and verapamil 360  mg daily & plan was to increase hydralazine to 50 mg 3 times daily (listed on his med list)  Irving Copas seen by Dr. Percell Miller on June 06, 2021 for evaluation of his right hip and low back pain.  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  Mr. Kahle is extremely disabled now by his hip pain.  Unable to walk.  Pain radiates to the front groin.  (He had been seen by his neurosurgeon-Dr. Arnoldo Morale as well as a neurosurgeon at [facility] who did not feel that back surgery would be beneficial given the extent of hip arthritis) => recommendation right hip arthroplasty.  Sent to his PCP for medical evaluation.   Recent Hospitalizations: None  Reviewed  CV studies:    The following studies were reviewed today: (if available, images/films reviewed: From Epic Chart or Care Everywhere) ABIs 03/14/2021: R ABI 0.67, TBI 0.34.  L ABI 1.12, TBI 0.52.  Indicates moderate RLE arterial disease.  No  significant L LE disease.  Interval History:   ANES FROSS presents here today for preop evaluation for his upcoming surgery.  He has not had any cardiac history in the past so he is not sure why he is here.  He is in significant amount of pain simply from walking into the office today using his walker.  He thinks that could explain his pressures being high because he is in a lot of pain-however his blood pressure today is pretty normal for him..    Routinely uses a walker to walk, but cannot walk more than 40 or 50 feet without having to stop to rest because of hip pain. Despite having high blood pressure, he has never had any problems with headache or blurred vision.  No syncope or near syncope.  No TIA or amaurosis fugax.  He denies any symptoms of flash pulm edema-PND orthopnea or edema.  No postprandial abdominal pain or weight loss.  He does note somewhat decreased appetite and therefore eating smaller meals. No chest pain or pressure with rest or exertion.  He only gets dyspnea with exertion because of pain in his hip.  No palpitations or irregular heartbeats.  REVIEWED OF SYSTEMS   Review of Systems  Constitutional:  Positive for malaise/fatigue (Mostly related to how much effort it takes to walk because of hip pain.) and weight loss (Less appetite.  Eating smaller meals.).  HENT:  Negative for congestion and nosebleeds.   Respiratory:  Negative for cough and shortness of breath.   Cardiovascular:        Negative per HPI  Gastrointestinal:  Negative for abdominal pain, blood in stool and melena.  Genitourinary:  Negative for dysuria and hematuria.  Musculoskeletal:  Positive for back pain and joint pain (Right hip and sacroiliac). Negative for myalgias.  Neurological:  Positive for focal weakness (Right leg gets weak from hip pain). Negative for dizziness and weakness.  Psychiatric/Behavioral:  Negative for depression and memory loss. The patient is not nervous/anxious (Only  anxious about getting his surgery done) and does not have insomnia.    I have reviewed and (if needed) personally updated the patient's problem list, medications, allergies, past medical and surgical history, social and family history.   PAST MEDICAL HISTORY   Past Medical History:  Diagnosis Date   DJD (degenerative joint disease), lumbosacral    Hyperlipidemia    On statin   Hypertension    Resistant; on 4 medications.;  Doppler evidence of  RAS-but does not meet criteria for RA revascularization by CORAL Trial   Osteoarthritis of right hip    Bone-on-bone pain more severe pain (Dr. Edmonia Lynch)   PAD (peripheral artery disease) ([place]) 03/14/2021   ABIs to evaluate right foot pain: R ABI 0.67, TBI 0.34.  L ABI 1.12, TBI 0.52.  Indicates moderate RLE arterial disease.  No significant L LE disease.   RAS (renal artery stenosis) ([place]) 03/2021   Seen by Dr. Gwenlyn Saran from vascular surgery for bilateral RAS and SMA stenosis given poorly controlled hypertension.  Recommended to continue medical management as he did not meet criteria based on CORAL Trial.   Type 2 diabetes mellitus without complication ([place])    On metformin only.    PAST SURGICAL HISTORY   Past Surgical History:  Procedure Laterality Date   INGUINAL HERNIA REPAIR Bilateral 08/10/2012   Procedure: HERNIA REPAIR INGUINAL ADULT;  Surgeon: Scherry Ran, MD;  Location: [facility];  Service: General;  Laterality: Bilateral;     There is no immunization history on file for this patient.  MEDICATIONS/ALLERGIES   Current Meds  Medication Sig   atenolol (TENORMIN) 50 MG tablet Take 50 mg by mouth daily.   hydrALAZINE (APRESOLINE) 50 MG tablet Take 1 tablet (50 mg total) by mouth 3 (three) times daily.   hydrochlorothiazide (HYDRODIURIL) 50 MG tablet Take 50 mg by mouth daily.   HYDROcodone-acetaminophen (NORCO/VICODIN) 5-325 MG tablet Take 1 tablet by mouth every 6 (six) hours as needed for moderate pain.    lisinopril (PRINIVIL,ZESTRIL) 10 MG tablet Take 10 mg by mouth daily.   metFORMIN (GLUCOPHAGE) 1000 MG tablet Take 1,000 mg by mouth 2 (two) times daily with a meal.   simvastatin (ZOCOR) 40 MG tablet Take 40 mg by mouth every evening.   verapamil (VERELAN PM) 360 MG 24 hr capsule Take 360 mg by mouth at bedtime.  Hydralazine 50 mg 3 times daily ordered by PCP, not listed on med list = updated.  No Known Allergies  SOCIAL HISTORY/FAMILY HISTORY   Reviewed in Epic:   Social History   Tobacco Use   Smoking status: Former   Smokeless tobacco: Never  Substance Use Topics   Alcohol use: Yes    Comment: seldom   Drug use: No   Social History   Social History Narrative   Not on file   History reviewed. No pertinent family history.  OBJCTIVE -PE, EKG, labs   Wt Readings from Last 3 Encounters:  07/06/21 133 lb 3.2 oz (60.4 kg)  03/09/21 137 lb (62.1 kg)  11/15/19 137 lb (62.1 kg)    Physical Exam: BP (!) 150/60 (BP Location: Right Arm, Patient Position: Sitting)    Pulse (!) 59    Ht 5' 7.5" (1.715 m)    Wt 133 lb 3.2 oz (60.4 kg)    SpO2 98%    BMI 20.55 kg/m  Physical Exam Vitals reviewed.  Constitutional:      General: He is not in acute distress (Just noted to be in pain from walking to the office from the car.).    Appearance: Normal appearance. He is normal weight. He is ill-appearing (Mild chronic ill appearance). He is not toxic-appearing.     Comments: Well-groomed.  Seems somewhat frail.  Very immobilized because of his hip pain.  HENT:     Head: Normocephalic and atraumatic.  Neck:     Vascular: No carotid bruit.  Cardiovascular:     Rate and Rhythm: Normal rate and  regular rhythm. Occasional Extrasystoles are present.    Chest Wall: PMI is not displaced.     Pulses: Intact distal pulses.          Femoral pulses are  on the right side with bruit and  on the left side with bruit.      Popliteal pulses are 1+ on the right side and 2+ on the left side.        Dorsalis pedis pulses are 1+ on the right side.       Posterior tibial pulses are 1+ on the right side and 2+ on the left side.     Heart sounds: Murmur heard.  Harsh crescendo-decrescendo early systolic murmur is present with a grade of 2/6 at the upper right sternal border radiating to the neck.    No friction rub. Gallop present. S4 (Soft S4 gallop) sounds present.     Comments: Normal S1 with split S2.  Pulmonary:     Effort: Pulmonary effort is normal. No respiratory distress.     Breath sounds: Normal breath sounds. No wheezing, rhonchi or rales.  Chest:     Chest wall: No tenderness.  Abdominal:     General: Abdomen is flat. Bowel sounds are normal. There is no distension.     Palpations: Abdomen is soft. There is no mass.     Tenderness: There is no abdominal tenderness. There is no guarding.     Comments: No HSM or bruit  Musculoskeletal:        General: No swelling. Normal range of motion.     Cervical back: Normal range of motion and neck supple.  Skin:    General: Skin is warm and dry.  Neurological:     General: No focal deficit present.     Mental Status: He is alert and oriented to person, place, and time.     Gait: Gait abnormal (Significant antalgic gait, uses walker.Linus Mako very slow).  Psychiatric:        Mood and Affect: Mood normal.        Behavior: Behavior normal.        Thought Content: Thought content normal.        Judgment: Judgment normal.     Adult ECG Report  Rate: 59 ;  Rhythm: normal sinus rhythm, premature atrial contractions (PAC), and RBBB, LAFB ; Bifascicular Block with repolarization changes  Narrative Interpretation: ~ stable but LAFB new from 2013 (last available EKG)  Recent Labs:  none available   No results found for: CHOL, HDL, LDLCALC, LDLDIRECT, TRIG, CHOLHDL Lab Results  Component Value Date   CREATININE 0.87 08/07/2012   BUN 22 08/07/2012   NA 138 08/07/2012   K 5.1 08/07/2012   CL 101 08/07/2012   CO2 25 08/07/2012   CBC  Latest Ref Rng & Units 08/07/2012  WBC 4.0 - 10.5 K/uL 7.8  Hemoglobin 13.0 - 17.0 g/dL 14.7  Hematocrit 39.0 - 52.0 % 42.8  Platelets 150 - 400 K/uL 210    No results found for: HGBA1C No results found for: TSH  ==================================================  COVID-19 Education: The signs and symptoms of COVID-19 were discussed with the patient and how to seek care for testing (follow up with PCP or arrange E-visit).    I spent a total of 31 minutes with the patient spent in direct patient consultation.  Additional time spent with chart review  / charting (studies, outside notes, etc): 33 min Total Time: 64 min  Current medicines are  reviewed at length with the patient today.  (+/- concerns) n/a  This visit occurred during the SARS-CoV-2 public health emergency.  Safety protocols were in place, including screening questions prior to the visit, additional usage of staff PPE, and extensive cleaning of exam room while observing appropriate contact time as indicated for disinfecting solutions.  Notice: This dictation was prepared with Dragon dictation along with smart phrase technology. Any transcriptional errors that result from this process are unintentional and may not be corrected upon review.   Studies Ordered:  Orders Placed This Encounter  Procedures   EKG 12-Lead   ECHOCARDIOGRAM COMPLETE    Patient Instructions / Medication Changes & Studies & Tests Ordered   Patient Instructions  Medication Instructions:   NO CHANGES  *If you need a refill on your cardiac medications before your next appointment, please call your pharmacy*   Lab Work: NOT NEEDED  If you have labs (blood work) drawn today and your tests are completely normal, you will receive your results only by: MyChart Message (if you have MyChart) OR A paper copy in the mail If you have any lab test that is abnormal or we need to change your treatment, we will call you to review the  results.   Testing/Procedures:  WILL BE SCHEDULE AT [facility] Your physician has requested that you have an echocardiogram. Echocardiography is a painless test that uses sound waves to create images of your heart. It provides your doctor with information about the size and shape of your heart and how well your hearts chambers and valves are working. This procedure takes approximately one hour. There are no restrictions for this procedure.   Follow-Up: At [facility], you and your health needs are our priority.  As part of our continuing mission to provide you with exceptional heart care, we have created designated Provider Care Teams.  These Care Teams include your primary Cardiologist (physician) and Advanced Practice Providers (APPs -  Physician Assistants and Nurse Practitioners) who all work together to provide you with the care you need, when you need it.     Your next appointment:    2 TO 3 month(s)  The format for your next appointment:   In Person  Provider:   None    Other Instructions   IF  ECHO IS NORMAL YOU ARE OKAY TO HAVE  HIP SURGERY     Glenetta Hew, M.D., M.S. Interventional Cardiologist   Pager # [phone] Phone # [phone] [address] [place], [place] [address]   Thank you for choosing Heartcare at [facility]!!

## 2021-07-06 NOTE — Patient Instructions (Signed)
Medication Instructions:  ? ?NO CHANGES ? ?*If you need a refill on your cardiac medications before your next appointment, please call your pharmacy* ? ? ?Lab Work: ?NOT NEEDED ? ?If you have labs (blood work) drawn today and your tests are completely normal, you will receive your results only by: ?MyChart Message (if you have MyChart) OR ?A paper copy in the mail ?If you have any lab test that is abnormal or we need to change your treatment, we will call you to review the results. ? ? ?Testing/Procedures: ? ?WILL BE SCHEDULE AT [facility] ?Your physician has requested that you have an echocardiogram. Echocardiography is a painless test that uses sound waves to create images of your heart. It provides your doctor with information about the size and shape of your heart and how well your heart?s chambers and valves are working. This procedure takes approximately one hour. There are no restrictions for this procedure. ? ? ?Follow-Up: ?At [facility], you and your health needs are our priority.  As part of our continuing mission to provide you with exceptional heart care, we have created designated Provider Care Teams.  These Care Teams include your primary Cardiologist (physician) and Advanced Practice Providers (APPs -  Physician Assistants and Nurse Practitioners) who all work together to provide you with the care you need, when you need it. ? ?  ? ?Your next appointment:   ? 2 TO 3 month(s) ? ?The format for your next appointment:   ?In Person ? ?Provider:   ?None  ? ? ?Other Instructions  ? IF  ECHO IS NORMAL YOU ARE OKAY TO HAVE  HIP SURGERY  ?

## 2021-07-07 ENCOUNTER — Encounter: Payer: Self-pay | Admitting: Cardiology

## 2021-07-07 DIAGNOSIS — E1169 Type 2 diabetes mellitus with other specified complication: Secondary | ICD-10-CM | POA: Insufficient documentation

## 2021-07-07 MED ORDER — HYDRALAZINE HCL 50 MG PO TABS: 50.0000 mg | ORAL_TABLET | Freq: Three times a day (TID) | ORAL | 2 refills | Status: DC

## 2021-07-07 NOTE — Assessment & Plan Note (Signed)
On multiple medications.  I updated his list to include hydralazine 50 mg 3 times daily because of not listed on our med list. ? ?He has room to titrate lisinopril further, but not the atenolol for verapamil, however if we converted from verapamil to amlodipine, we could potentially increase beta-blocker. ?He is on 50 mg of hydrochlorothiazide -> better data with 50 mg chlorthalidone (can address postop) ? ?For now, his pressures seem to be pretty well controlled.  Perioperatively, would potentially use hydralazine to higher doses to address increased pressures. ?

## 2021-07-07 NOTE — Assessment & Plan Note (Signed)
81 year old gentleman with pending hip surgery who has no active cardiac symptoms.  No active angina or CHF symptoms and no arrhythmias. ? ?He has exertional dyspnea mostly because of hip pain and therefore cannot do 4 METS.  However, in the absence of any ongoing symptoms, would not want any further testing them EKG already done. ? ?Because of this EKG abnormal we are checking 2D echocardiogram just to get a baseline cardiac function and valvular assessment. ? ?PREOPERATIVE CARDIAC RISK ASSESSMENT  ? ?Revised Cardiac Risk Index: ?? High Risk Surgery: no; (intermediate = Hip Arthoplasty) ?? Defined as Intraperitoneal, intrathoracic or suprainguinal vascular ?? Active CAD: no; no angina ?? CHF: no; no PND/orthopnea or edema ?? Cerebrovascular Disease: no; no TIA or CVA Hx ?? Diabetes: yes; On Insulin: no ?? CKD (Cr >~ 2): NO ? ?Total: 0-1 ?Estimated Risk of Adverse Outcome: Class I; with his primary risk being his age. ?Estimated Risk of MI, PE, VF/VT (Cardiac Arrest), Complete Heart Block: 1-3% with risk being mitigated by long-term beta-blocker (0.5-1.5%) ? ? ?ACC/AHA Guidelines for "Clearance": ?? Step 1 - Need for Emergency Surgery: No:  ?? If Yes - go straight to OR with perioperative surveillance ?? Step 2 - Active Cardiac Conditions (Unstable Angina, Decompensated HF, Significant  Arrhytmias - Complete HB, Mobitz II, Symptomatic VT or SVT, Severe Aortic Stenosis - mean gradient > 40 mmHg, Valve area < 1.0 cm2):  ?? No:  ?? If Yes - Evaluate & Treat per ACC/AHA Guidelines ?? Step 3 -  Low Risk Surgery: No: INTERMEDIATE ?? If Yes --> proceed to OR ?? If No --> Step 4 ?? Step 4 - Functional Capacity >= 4 METS without symptoms: No: limited by Hip pain. ?? If Yes --> proceed to OR ?? If No --> Step 5 ?? Step 5 --  Clinical Risk Factors (CRF)  ?? 3 or more: No:  ?? If Yes -- assess Surgical Risk, --  ?? Intermediate Risk: Proceed to OR with HR control, or consider testing if it will change management ?? 1-2 or more  CRFs: No:  ?? If Yes -- assess Surgical Risk, --  ?? (High Risk Non-cardiac), Intraabdominal or thoracic vascular surgery --> Proceed to OR, or consider testing if it will change management. ?? Intermediate Risk: Proceed to OR with HR control, or consider testing if it will change management ?? No CRFs: Yes - as noted above.  ?? If Yes --> Proceed to OR ? ?In the absence of active anginal or CHF symptoms, and no arrhythmias, there is no indication for further testing.  It would not change management as we would not routinely consider ischemic evaluation.  ?With significant resistant hypertension, abnormal EKG and murmur exam, we are checking a 2D echocardiogram to get an assessment of his EF and valvular disease.  Pending results of his echocardiogram, I anticipate that she will proceed to the OR without any further testing.  He is almost relatively stable medication his blood pressures are fairly well controlled.   ? ?Would be reluctant to titrate ACE inhibitor perioperatively.  Would simply use higher dose of hydralazine if necessary. ? ?Further assessment pending results of echocardiogram.  We will forward results and recommendation to Dr. Eulah Pont and PCP Team.  ? ?

## 2021-07-07 NOTE — Assessment & Plan Note (Signed)
Systolic ejection murmur this is probably consistent with aortic stenosis or sclerosis.  He has not had any previous evaluation, will therefore evaluate with a 2D echocardiogram just to exclude significant valvular disease.  He is relatively asymptomatic and therefore I would not expect it to be significant. ?

## 2021-07-07 NOTE — Assessment & Plan Note (Signed)
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  This in conjunction with the fact that he has a murmur with resistant hypertension warrants evaluation with 2D echocardiogram.  Would like to make sure that there is no evidence of any structural or wall motion abnormality. ? ?He is on 2 AV nodal agents with out signs of chronotropic incompetence. ? ?Plan: Check 2D echo ?

## 2021-07-07 NOTE — Assessment & Plan Note (Signed)
He is on simvastatin.  Unfortunately do not have labs available, but has been managed by PCP. ? ?Diabetes is managed with metformin.  Also do not have labs available. ?

## 2021-07-12 ENCOUNTER — Other Ambulatory Visit: Payer: Self-pay

## 2021-07-12 ENCOUNTER — Ambulatory Visit (INDEPENDENT_AMBULATORY_CARE_PROVIDER_SITE_OTHER): Payer: Medicare Other

## 2021-07-12 DIAGNOSIS — I452 Bifascicular block: Secondary | ICD-10-CM | POA: Diagnosis not present

## 2021-07-12 DIAGNOSIS — I10 Essential (primary) hypertension: Secondary | ICD-10-CM

## 2021-07-12 DIAGNOSIS — R011 Cardiac murmur, unspecified: Secondary | ICD-10-CM | POA: Diagnosis not present

## 2021-07-12 DIAGNOSIS — Z01810 Encounter for preprocedural cardiovascular examination: Secondary | ICD-10-CM | POA: Diagnosis not present

## 2021-07-12 LAB — ECHOCARDIOGRAM COMPLETE
AR max vel: 2.48 cm2
AV Area VTI: 2.71 cm2
AV Area mean vel: 2.43 cm2
AV Mean grad: 5 mmHg
AV Peak grad: 9.6 mmHg
Ao pk vel: 1.55 m/s
Area-P 1/2: 5.5 cm2
Calc EF: 74.4 %
MV M vel: 6.18 m/s
MV Peak grad: 152.8 mmHg
MV VTI: 2.49 cm2
S' Lateral: 1.67 cm
Single Plane A2C EF: 77.9 %
Single Plane A4C EF: 64.8 %

## 2021-07-16 ENCOUNTER — Telehealth: Payer: Self-pay | Admitting: *Deleted

## 2021-07-16 NOTE — Telephone Encounter (Signed)
Patient reviewed  via mychart . RN called patient  to arrange follow up appointment with Dr Ellyn Hack to discuss results and do further testing. Patient states he reviewed  result and everything is alright, he stated adamantly that he was not coming back at all for anything else at this office and if the doctor did not sign off he will go back to his other doctor. ? ? RN notified Dr Ellyn Hack of conversation . ?

## 2021-07-16 NOTE — Telephone Encounter (Signed)
-----   Message from Leonie Man, MD sent at 07/14/2021  2:45 PM EST ----- ?Echocardiogram result is pretty normal.  Pulm function 65 to 70% hyperdynamic function.  no wall motion normalities.Severe concentric hypertrophy with grade 2 diastolic dysfunction and elevated left atrial pressures with severely dilated left atrium. ? ?Also estimation of severely elevated pulm arterial pressures with moderate dilated right atrium.  Right ventricular pressures estimated to be 71 mmHg this goes along with moderately elevated right atrial pressures. ? ?Mild aortic valve calcification with moderate thickening but no stenosis. ? ?This echocardiogram is somewhat abnormal and shows pretty significant diastolic dysfunction with elevated pressures. ? ?While we are wanting to ensure that he can get his surgery done in order to allow him to continue to walk, I do think that we need to assess this more directly with a right heart catheterization prior to clearing for surgery.  This is probably all related to poorly controlled hypertension. ? ?We should see if we can get him in quickly to be seen to discuss results and set up for right heart catheterization which could lead to optimization. ? ?Glenetta Hew, MD ? ? ? ? ? ?

## 2021-07-17 NOTE — Progress Notes (Signed)
Unfortunately, the patient was sent for cardiology evaluation as part of preop assessment.  He was found to have abnormal findings on exam and an abnormal EKG that would have caused his surgery to be delayed.  The procedure ordered was an echocardiogram which then showed significant abnormalities.   ? ?As a result, these echocardiogram findings need to be further evaluated before I feel comfortable giving a verdict as far as his preoperative risk assessment.. ? ?While I apologize that he is upset about the results, I cannot control the fact that the test was abnormal.  It was because of the suspicion that it could be abnormal, that I did order the test. ? ?If he does not want to come back to this office, that is fine with me.  There are other cardiologist in town who will be more than happy to take care of him. ? ?Glenetta Hew, MD ?

## 2021-07-26 ENCOUNTER — Other Ambulatory Visit: Payer: Self-pay

## 2021-07-26 ENCOUNTER — Encounter: Payer: Self-pay | Admitting: Nurse Practitioner

## 2021-07-26 ENCOUNTER — Ambulatory Visit: Payer: Medicare Other | Admitting: Nurse Practitioner

## 2021-07-26 VITALS — BP 170/60 | HR 60 | Ht 67.5 in | Wt 132.4 lb

## 2021-07-26 DIAGNOSIS — I272 Pulmonary hypertension, unspecified: Secondary | ICD-10-CM | POA: Diagnosis not present

## 2021-07-26 DIAGNOSIS — I10 Essential (primary) hypertension: Secondary | ICD-10-CM

## 2021-07-26 DIAGNOSIS — E782 Mixed hyperlipidemia: Secondary | ICD-10-CM

## 2021-07-26 DIAGNOSIS — I517 Cardiomegaly: Secondary | ICD-10-CM

## 2021-07-26 DIAGNOSIS — K551 Chronic vascular disorders of intestine: Secondary | ICD-10-CM

## 2021-07-26 DIAGNOSIS — I452 Bifascicular block: Secondary | ICD-10-CM | POA: Diagnosis not present

## 2021-07-26 DIAGNOSIS — E119 Type 2 diabetes mellitus without complications: Secondary | ICD-10-CM

## 2021-07-26 DIAGNOSIS — I739 Peripheral vascular disease, unspecified: Secondary | ICD-10-CM

## 2021-07-26 DIAGNOSIS — I701 Atherosclerosis of renal artery: Secondary | ICD-10-CM

## 2021-07-26 MED ORDER — LISINOPRIL 20 MG PO TABS: 20.0000 mg | ORAL_TABLET | Freq: Every day | ORAL | 3 refills | Status: DC

## 2021-07-26 MED ORDER — SODIUM CHLORIDE 0.9% FLUSH: 3.0000 mL | Freq: Two times a day (BID) | INTRAVENOUS | Status: AC

## 2021-07-26 NOTE — Progress Notes (Signed)
? ? ?Office Visit  ?  ?Patient Name: Mark Cross ?Date of Encounter: 07/26/2021 ? ?Primary Care Provider:  Leslie Andrea, MD ?Primary Cardiologist:  Glenetta Hew, MD ? ?Chief Complaint  ?  ?81 year old male with a history of hypertension, LVH, pulmonary hypertension, bifascicular bundle branch block, murmur, PAD, bilateral renal artery stenosis, mesenteric artery stenosis, hyperlipidemia, type 2 diabetes, and osteoarthritis. who presents for follow-up related to hypertension, LVH, and pulmonary hypertension. ? ?Past Medical History  ?  ?Past Medical History:  ?Diagnosis Date  ? DJD (degenerative joint disease), lumbosacral   ? Hyperlipidemia   ? On statin  ? Hypertension   ? Resistant; on 4 medications.;  Doppler evidence of RAS-but does not meet criteria for RA revascularization by CORAL Trial  ? Osteoarthritis of right hip   ? Bone-on-bone pain more severe pain (Dr. Edmonia Lynch)  ? PAD (peripheral artery disease) ([place]) 03/14/2021  ? ABIs to evaluate right foot pain: R ABI 0.67, TBI 0.34.  L ABI 1.12, TBI 0.52.  Indicates moderate RLE arterial disease.  No significant L LE disease.  ? RAS (renal artery stenosis) ([place]) 03/2021  ? Seen by Dr. Gwenlyn Saran from vascular surgery for bilateral RAS and SMA stenosis given poorly controlled hypertension.  Recommended to continue medical management as he did not meet criteria based on CORAL Trial.  ? Type 2 diabetes mellitus without complication ([place])   ? On metformin only.  ? ?Past Surgical History:  ?Procedure Laterality Date  ? INGUINAL HERNIA REPAIR Bilateral 08/10/2012  ? Procedure: HERNIA REPAIR INGUINAL ADULT;  Surgeon: Scherry Ran, MD;  Location: [facility];  Service: General;  Laterality: Bilateral;  ? ? ?Allergies ? ?No Known Allergies ? ?History of Present Illness  ?  ?81 year old male with a history of hypertension, LVH, pulmonary hypertension, bifascicular bundle branch block, murmur, PAD, bilateral renal restenosis, superior mesenteric artery  stenosis, hyperlipidemia, type 2 diabetes, and osteoarthritis. ? ?He has a history of hypertension with bilateral renal artery stenosis/SMA stenosis. He was previously evaluated by vascular surgery for this, medical therapy recommended.  ABIs in the setting of right foot pain showed moderate RLE arterial disease.  He established care with Dr. Ellyn Hack on 07/06/2021 in the setting of preoperative cardiac evaluation.  EKG showed bifascicular block.  He was noted to have systolic ejection murmur on exam.  Echocardiogram was recommended which showed EF 65 to 70%, hyperdynamic LV function, severe concentric LVH, grade 2 diastolic dysfunction, elevated left atrial pressure, mildly enlarged RV, severe elevated pulmonary arterial systolic pressure, severe LAE, moderate RAE, circumferential pericardial effusion, moderate mitral valve regurgitation, aortic valve sclerosis without evidence of aortic stenosis.  Findings consistent with hypertrophic cardiomyopathy, no evidence of LVOT obstruction likely in the setting of poorly controlled hypertension.  Follow-up right heart catheterization was recommended. ? ?He presents today for follow-up accompanied by his wife. Since his last visit he has been stable from a cardiac standpoint. He denies dyspnea, edema, weight gain, PND, orthopnea, presyncope, syncope.  Denies symptoms concerning for angina.  BP remains elevated.  He is in significant pain from his right hip arthritis, for which he still needs surgery.  He is eager to proceed with right heart catheterization per Dr. Allison Quarry recommendation so that he may ultimately proceed with hip surgery. That is his biggest concern today, otherwise, he denies any additional concerns or complaints. ? ?Home Medications  ?  ?Current Outpatient Medications  ?Medication Sig Dispense Refill  ? atenolol (TENORMIN) 50 MG tablet Take 50 mg by  mouth daily.    ? hydrALAZINE (APRESOLINE) 50 MG tablet Take 1 tablet (50 mg total) by mouth 3 (three) times  daily. (Patient taking differently: Take 50 mg by mouth 3 (three) times daily. Takes 2 (two) tablets TID) 90 tablet 2  ? hydrochlorothiazide (HYDRODIURIL) 50 MG tablet Take 50 mg by mouth daily.    ? HYDROcodone-acetaminophen (NORCO/VICODIN) 5-325 MG tablet Take 1 tablet by mouth every 6 (six) hours as needed for moderate pain.    ? lisinopril (ZESTRIL) 20 MG tablet Take 1 tablet (20 mg total) by mouth daily. 90 tablet 3  ? metFORMIN (GLUCOPHAGE) 1000 MG tablet Take 1,000 mg by mouth 2 (two) times daily with a meal.    ? simvastatin (ZOCOR) 40 MG tablet Take 40 mg by mouth every evening.    ? verapamil (VERELAN PM) 360 MG 24 hr capsule Take 360 mg by mouth at bedtime.    ? ?Current Facility-Administered Medications  ?Medication Dose Route Frequency Provider Last Rate Last Admin  ? sodium chloride flush (NS) 0.9 % injection 3 mL  3 mL Intravenous Q12H Karilynn Carranza C, NP      ?  ? ?Review of Systems  ?  ?He denies chest pain, palpitations, dyspnea, pnd, orthopnea, n, v, dizziness, syncope, edema, weight gain, or early satiety. All other systems reviewed and are otherwise negative except as noted above.  ? ?Physical Exam  ?  ?VS:  BP (!) 170/60 (BP Location: Left Arm, Patient Position: Sitting, Cuff Size: Normal)   Pulse 60   Ht 5' 7.5" (1.715 m)   Wt 132 lb 6.4 oz (60.1 kg)   SpO2 98%   BMI 20.43 kg/m?   ?GEN: Well nourished, well developed, in no acute distress. ?HEENT: normal. ?Neck: Supple, no JVD, carotid bruits, or masses. ?Cardiac: RRR, 2-3/6 systolic murmur, no rubs, or gallops. No clubbing, cyanosis, edema.  Radials/DP/PT 2+ and equal bilaterally.  ?Respiratory:  Respirations regular and unlabored, clear to auscultation bilaterally. ?GI: Soft, nontender, nondistended, BS + x 4. ?MS: no deformity or atrophy. ?Skin: warm and dry, no rash. ?Neuro:  Strength and sensation are intact. ?Psych: Normal affect. ? ?Accessory Clinical Findings  ?  ?ECG personally reviewed by me today - No EKG in office today - no  acute changes. ? ?Lab Results  ?Component Value Date  ? WBC 7.8 08/07/2012  ? HGB 14.7 08/07/2012  ? HCT 42.8 08/07/2012  ? MCV 89.2 08/07/2012  ? PLT 210 08/07/2012  ? ?Lab Results  ?Component Value Date  ? CREATININE 0.87 08/07/2012  ? BUN 22 08/07/2012  ? NA 138 08/07/2012  ? K 5.1 08/07/2012  ? CL 101 08/07/2012  ? CO2 25 08/07/2012  ? ?No results found for: ALT, AST, GGT, ALKPHOS, BILITOT ?No results found for: CHOL, HDL, LDLCALC, LDLDIRECT, TRIG, CHOLHDL  ?No results found for: HGBA1C ? ?Assessment & Plan  ?  ?1. Hypertension: BP remains above goal. Will increase lisinopril to 20 mg daily.  I suspect he will need further adjustment to BP meds in the future to achieve BP goal of <130/80. BMET today and in 2 weeks.  Could consider further titration of lisinopril if renal function allows, escalation of beta-blocker if HR allows, or transition from hydrochlorothiazide to chlorthalidone.  Continue atenolol, hydralazine, lisinopril as above, hydrochlorothiazide, and verapamil. ? ?2. LVH/pulmonary hypertension: Recent echo showed EF 65 to 70%, hyperdynamic LV function, severe concentric LVH, grade 2 diastolic dysfunction, elevated left atrial pressure, mildly enlarged RV, severe elevated pulmonary arterial systolic   pressure, severe LAE, moderate RAE, circumferential pericardial effusion, moderate mitral valve regurgitation, aortic valve sclerosis without evidence of aortic stenosis. Dr. Ellyn Hack, primary cardiologist, recommended RHC. Euvolemic and well compensated on exam, he is seemingly asymptomatic. Discussed indication for RHC for further evaluation and treatment guidance. Patient verbalized understanding. [place] scheduled for 08/01/2021 with Dr. Ellyn Hack.  Continue current medications as above. ? ?Shared Decision Making/Informed Consent ?The risks [stroke (1 in 1000), death (1 in 88), kidney failure [usually temporary] (1 in 500), bleeding (1 in 200), allergic reaction [possibly serious] (1 in 200)], benefits  (diagnostic support and management of coronary artery disease) and alternatives of a cardiac catheterization were discussed in detail with Mr. Gallier and he is willing to proceed.  ? ?3. Bifascicular bundle branch bl

## 2021-07-26 NOTE — H&P (View-Only) (Signed)
? ? ?Office Visit  ?  ?Patient Name: Mark Cross ?Date of Encounter: 07/26/2021 ? ?Primary Care Provider:  Leslie Andrea, MD ?Primary Cardiologist:  Glenetta Hew, MD ? ?Chief Complaint  ?  ?81 year old male with a history of hypertension, LVH, pulmonary hypertension, bifascicular bundle branch block, murmur, PAD, bilateral renal artery stenosis, mesenteric artery stenosis, hyperlipidemia, type 2 diabetes, and osteoarthritis. who presents for follow-up related to hypertension, LVH, and pulmonary hypertension. ? ?Past Medical History  ?  ?Past Medical History:  ?Diagnosis Date  ? DJD (degenerative joint disease), lumbosacral   ? Hyperlipidemia   ? On statin  ? Hypertension   ? Resistant; on 4 medications.;  Doppler evidence of RAS-but does not meet criteria for RA revascularization by CORAL Trial  ? Osteoarthritis of right hip   ? Bone-on-bone pain more severe pain (Dr. Edmonia Lynch)  ? PAD (peripheral artery disease) ([place]) 03/14/2021  ? ABIs to evaluate right foot pain: R ABI 0.67, TBI 0.34.  L ABI 1.12, TBI 0.52.  Indicates moderate RLE arterial disease.  No significant L LE disease.  ? RAS (renal artery stenosis) ([place]) 03/2021  ? Seen by Dr. Gwenlyn Saran from vascular surgery for bilateral RAS and SMA stenosis given poorly controlled hypertension.  Recommended to continue medical management as he did not meet criteria based on CORAL Trial.  ? Type 2 diabetes mellitus without complication ([place])   ? On metformin only.  ? ?Past Surgical History:  ?Procedure Laterality Date  ? INGUINAL HERNIA REPAIR Bilateral 08/10/2012  ? Procedure: HERNIA REPAIR INGUINAL ADULT;  Surgeon: Scherry Ran, MD;  Location: [facility];  Service: General;  Laterality: Bilateral;  ? ? ?Allergies ? ?No Known Allergies ? ?History of Present Illness  ?  ?81 year old male with a history of hypertension, LVH, pulmonary hypertension, bifascicular bundle branch block, murmur, PAD, bilateral renal restenosis, superior mesenteric artery  stenosis, hyperlipidemia, type 2 diabetes, and osteoarthritis. ? ?He has a history of hypertension with bilateral renal artery stenosis/SMA stenosis. He was previously evaluated by vascular surgery for this, medical therapy recommended.  ABIs in the setting of right foot pain showed moderate RLE arterial disease.  He established care with Dr. Ellyn Hack on 07/06/2021 in the setting of preoperative cardiac evaluation.  EKG showed bifascicular block.  He was noted to have systolic ejection murmur on exam.  Echocardiogram was recommended which showed EF 65 to 70%, hyperdynamic LV function, severe concentric LVH, grade 2 diastolic dysfunction, elevated left atrial pressure, mildly enlarged RV, severe elevated pulmonary arterial systolic pressure, severe LAE, moderate RAE, circumferential pericardial effusion, moderate mitral valve regurgitation, aortic valve sclerosis without evidence of aortic stenosis.  Findings consistent with hypertrophic cardiomyopathy, no evidence of LVOT obstruction likely in the setting of poorly controlled hypertension.  Follow-up right heart catheterization was recommended. ? ?He presents today for follow-up accompanied by his wife. Since his last visit he has been stable from a cardiac standpoint. He denies dyspnea, edema, weight gain, PND, orthopnea, presyncope, syncope.  Denies symptoms concerning for angina.  BP remains elevated.  He is in significant pain from his right hip arthritis, for which he still needs surgery.  He is eager to proceed with right heart catheterization per Dr. Allison Quarry recommendation so that he may ultimately proceed with hip surgery. That is his biggest concern today, otherwise, he denies any additional concerns or complaints. ? ?Home Medications  ?  ?Current Outpatient Medications  ?Medication Sig Dispense Refill  ? atenolol (TENORMIN) 50 MG tablet Take 50 mg by  mouth daily.    ? hydrALAZINE (APRESOLINE) 50 MG tablet Take 1 tablet (50 mg total) by mouth 3 (three) times  daily. (Patient taking differently: Take 50 mg by mouth 3 (three) times daily. Takes 2 (two) tablets TID) 90 tablet 2  ? hydrochlorothiazide (HYDRODIURIL) 50 MG tablet Take 50 mg by mouth daily.    ? HYDROcodone-acetaminophen (NORCO/VICODIN) 5-325 MG tablet Take 1 tablet by mouth every 6 (six) hours as needed for moderate pain.    ? lisinopril (ZESTRIL) 20 MG tablet Take 1 tablet (20 mg total) by mouth daily. 90 tablet 3  ? metFORMIN (GLUCOPHAGE) 1000 MG tablet Take 1,000 mg by mouth 2 (two) times daily with a meal.    ? simvastatin (ZOCOR) 40 MG tablet Take 40 mg by mouth every evening.    ? verapamil (VERELAN PM) 360 MG 24 hr capsule Take 360 mg by mouth at bedtime.    ? ?Current Facility-Administered Medications  ?Medication Dose Route Frequency Provider Last Rate Last Admin  ? sodium chloride flush (NS) 0.9 % injection 3 mL  3 mL Intravenous Q12H Nova Schmuhl, Helane Gunther, NP      ?  ? ?Review of Systems  ?  ?He denies chest pain, palpitations, dyspnea, pnd, orthopnea, n, v, dizziness, syncope, edema, weight gain, or early satiety. All other systems reviewed and are otherwise negative except as noted above.  ? ?Physical Exam  ?  ?VS:  BP (!) 170/60 (BP Location: Left Arm, Patient Position: Sitting, Cuff Size: Normal)   Pulse 60   Ht 5' 7.5" (1.715 m)   Wt 132 lb 6.4 oz (60.1 kg)   SpO2 98%   BMI 20.43 kg/m?   ?GEN: Well nourished, well developed, in no acute distress. ?HEENT: normal. ?Neck: Supple, no JVD, carotid bruits, or masses. ?Cardiac: RRR, [id number] systolic murmur, no rubs, or gallops. No clubbing, cyanosis, edema.  Radials/DP/PT 2+ and equal bilaterally.  ?Respiratory:  Respirations regular and unlabored, clear to auscultation bilaterally. ?GI: Soft, nontender, nondistended, BS + x 4. ?MS: no deformity or atrophy. ?Skin: warm and dry, no rash. ?Neuro:  Strength and sensation are intact. ?Psych: Normal affect. ? ?Accessory Clinical Findings  ?  ?ECG personally reviewed by me today - No EKG in office today - no  acute changes. ? ?Lab Results  ?Component Value Date  ? WBC 7.8 08/07/2012  ? HGB 14.7 08/07/2012  ? HCT 42.8 08/07/2012  ? MCV 89.2 08/07/2012  ? PLT 210 08/07/2012  ? ?Lab Results  ?Component Value Date  ? CREATININE 0.87 08/07/2012  ? BUN 22 08/07/2012  ? NA 138 08/07/2012  ? K 5.1 08/07/2012  ? CL 101 08/07/2012  ? CO2 25 08/07/2012  ? ?No results found for: ALT, AST, GGT, ALKPHOS, BILITOT ?No results found for: CHOL, HDL, LDLCALC, LDLDIRECT, TRIG, CHOLHDL  ?No results found for: HGBA1C ? ?Assessment & Plan  ?  ?1. Hypertension: BP remains above goal. Will increase lisinopril to 20 mg daily.  I suspect he will need further adjustment to BP meds in the future to achieve BP goal of <130/80. BMET today and in 2 weeks.  Could consider further titration of lisinopril if renal function allows, escalation of beta-blocker if HR allows, or transition from hydrochlorothiazide to chlorthalidone.  Continue atenolol, hydralazine, lisinopril as above, hydrochlorothiazide, and verapamil. ? ?2. LVH/pulmonary hypertension: Recent echo showed EF 65 to 70%, hyperdynamic LV function, severe concentric LVH, grade 2 diastolic dysfunction, elevated left atrial pressure, mildly enlarged RV, severe elevated pulmonary arterial systolic  pressure, severe LAE, moderate RAE, circumferential pericardial effusion, moderate mitral valve regurgitation, aortic valve sclerosis without evidence of aortic stenosis. Dr. Ellyn Hack, primary cardiologist, recommended RHC. Euvolemic and well compensated on exam, he is seemingly asymptomatic. Discussed indication for RHC for further evaluation and treatment guidance. Patient verbalized understanding. [place] scheduled for 08/01/2021 with Dr. Ellyn Hack.  Continue current medications as above. ? ?Shared Decision Making/Informed Consent ?The risks [stroke (1 in 1000), death (1 in 64), kidney failure [usually temporary] (1 in 500), bleeding (1 in 200), allergic reaction [possibly serious] (1 in 200)], benefits  (diagnostic support and management of coronary artery disease) and alternatives of a cardiac catheterization were discussed in detail with Mr. Slaght and he is willing to proceed.  ? ?3. Bifascicular bundle branch bl

## 2021-07-26 NOTE — Patient Instructions (Signed)
Medication Instructions:  ?Increase Lisinopril to 20 mg ( Take 1 Tablet Daily). ?*If you need a refill on your cardiac medications before your next appointment, please call your pharmacy* ? ? ?Lab Work: ?BMET, CBC : Today. ?BMET: To Be Done in 2 weeks ?If you have labs (blood work) drawn today and your tests are completely normal, you will receive your results only by: ?MyChart Message (if you have MyChart) OR ?A paper copy in the mail ?If you have any lab test that is abnormal or we need to change your treatment, we will call you to review the results. ? ? ?Testing/Procedures: ? ?[facility] ?[place] ?[address] ?[place] [address] ?Dept: [phone] ?LocMN:[id number] ? ?BYRLE GOBEN  07/26/2021 ? ?You are scheduled for a Cardiac Catheterization on Wednesday, March 29 with Dr. Glenetta Cross. ? ?1. Please arrive at the Main Entrance A at [facility]: [place], [place] [address] at 9:30 AM (This time is two hours before your procedure to ensure your preparation). Free valet parking service is available.  ? ?Special note: Every effort is made to have your procedure done on time. Please understand that emergencies sometimes delay scheduled procedures. ? ?2. Diet: Do not eat solid foods after midnight.  You may have clear liquids until 5 AM upon the day of the procedure. ? ?3. Labs: You will need to have blood drawn on Thursday, March 23 at [place], [place]  ?Open: 8am - 5pm (Lunch 12:30 - 1:30)   Phone: [phone]. You do not need to be fasting. ? ?4. Medication instructions in preparation for your procedure: ? ? Contrast Allergy: No ? ? ?Current Outpatient Medications (Endocrine & Metabolic):  ?  metFORMIN (GLUCOPHAGE) 1000 MG tablet, Take 1,000 mg by mouth 2 (two) times daily with a meal. ? ?Current Outpatient Medications (Cardiovascular):  ?  atenolol (TENORMIN) 50 MG tablet,  Take 50 mg by mouth daily. ?  hydrALAZINE (APRESOLINE) 50 MG tablet, Take 1 tablet (50 mg total) by mouth 3 (three) times daily. (Patient taking differently: Take 50 mg by mouth 3 (three) times daily. Takes 2 (two) tablets TID) ?  hydrochlorothiazide (HYDRODIURIL) 50 MG tablet, Take 50 mg by mouth daily. ?  simvastatin (ZOCOR) 40 MG tablet, Take 40 mg by mouth every evening. ?  verapamil (VERELAN PM) 360 MG 24 hr capsule, Take 360 mg by mouth at bedtime. ? ? ?Current Outpatient Medications (Analgesics):  ?  HYDROcodone-acetaminophen (NORCO/VICODIN) 5-325 MG tablet, Take 1 tablet by mouth every 6 (six) hours as needed for moderate pain. ? ? ?*For reference purposes while preparing patient instructions.   ?Delete this med list prior to printing instructions for patient.* ? ? ? ? ? ? ? ?Do not take Diabetes Med Glucophage (Metformin) on the day of the procedure and HOLD 48 HOURS AFTER THE PROCEDURE. ? ?On the morning of your procedure, take and any morning medicines NOT listed above.  You may use sips of water. ? ?5. Plan to go home the same day, you will only stay overnight if medically necessary. ?6. You MUST have a responsible adult to drive you home. ?7. An adult MUST be with you the first 24 hours after you arrive home. ?8. Bring a current list of your medications, and the last time and date medication taken. ?9. Bring ID and current insurance cards. ?10.Please wear clothes that are easy to get on and off and  wear slip-on shoes. ? ?Thank you for allowing [place] to care for you! ?  -- [facility]  ? ? ?Follow-Up: ?At [facility], you and your health needs are our priority.  As part of our continuing mission to provide you with exceptional heart care, we have created designated Provider Care Teams.  These Care Teams include your primary Cardiologist (physician) and Advanced Practice Providers (APPs -  Physician Assistants and Nurse Practitioners) who all work together to provide you  with the care you need, when you need it. ? ?We recommend signing up for the patient portal called "MyChart".  Sign up information is provided on this After Visit Summary.  MyChart is used to connect with patients for Virtual Visits (Telemedicine).  Patients are able to view lab/test results, encounter notes, upcoming appointments, etc.  Non-urgent messages can be sent to your provider as well.   ?To learn more about what you can do with MyChart, go to [email or web address].   ? ?Your next appointment:   ?3-4week(s) ? ?The format for your next appointment:   ?In Person ? ?Provider:   ?Mark Browner, NP  or Mark Hew, MD  ? ?  ?

## 2021-07-27 ENCOUNTER — Telehealth: Payer: Self-pay | Admitting: Nurse Practitioner

## 2021-07-27 LAB — CBC
Hematocrit: 36.4 % — ABNORMAL LOW (ref 37.5–51.0)
Hemoglobin: 12.1 g/dL — ABNORMAL LOW (ref 13.0–17.7)
MCH: 29.2 pg (ref 26.6–33.0)
MCHC: 33.2 g/dL (ref 31.5–35.7)
MCV: 88 fL (ref 79–97)
Platelets: 250 10*3/uL (ref 150–450)
RBC: 4.14 x10E6/uL (ref 4.14–5.80)
RDW: 13.1 % (ref 11.6–15.4)
WBC: 10.8 10*3/uL (ref 3.4–10.8)

## 2021-07-27 LAB — BASIC METABOLIC PANEL
BUN/Creatinine Ratio: 24 (ref 10–24)
BUN: 28 mg/dL — ABNORMAL HIGH (ref 8–27)
CO2: 20 mmol/L (ref 20–29)
Calcium: 9.5 mg/dL (ref 8.6–10.2)
Chloride: 104 mmol/L (ref 96–106)
Creatinine, Ser: 1.18 mg/dL (ref 0.76–1.27)
Glucose: 102 mg/dL — ABNORMAL HIGH (ref 70–99)
Potassium: 4.8 mmol/L (ref 3.5–5.2)
Sodium: 141 mmol/L (ref 134–144)
eGFR: 63 mL/min/{1.73_m2} (ref >59)

## 2021-07-27 NOTE — Telephone Encounter (Signed)
Pt c/o medication issue: ? ?1. Name of Medication: lisinopril (ZESTRIL) 20 MG tablet ? ?2. How are you currently taking this medication (dosage and times per day)? Take 1 tablet (20 mg total) by mouth daily. ? ?3. Are you having a reaction (difficulty breathing--STAT)?  ? ?4. What is your medication issue? Patient's wife called stating his already taking 40mg .  They had told him yesterday to increase it from 20mg  to 40mg  , but he was already talking 40mg .  She wants to know what would like him to do.   ?

## 2021-07-27 NOTE — Telephone Encounter (Signed)
Left message to call back  

## 2021-07-31 ENCOUNTER — Telehealth: Payer: Self-pay

## 2021-07-31 ENCOUNTER — Telehealth: Payer: Self-pay | Admitting: *Deleted

## 2021-07-31 NOTE — Telephone Encounter (Signed)
Right Heart Cath scheduled at [facility] for: Wednesday August 01, 2021 11:30 AM ?Arrival time and place: [facility] Main Entrance A at: 9:30 AM ? ? ?No solid food after midnight prior to cath, clear liquids until 5 AM day of procedure. ? ?Medication instructions: ?-Hold: ? Metformin/HCTZ-AM of procedure ?-Usual morning medications can be taken with sips of water. ? ?Confirmed patient has responsible adult to drive home post procedure and be with patient first 24 hours after arriving home. ? ?Patient reports no new symptoms concerning for COVID-19/no exposure to COVID-19 in the past 10 days. ? ?Reviewed procedure instructions with patient. ?

## 2021-07-31 NOTE — Telephone Encounter (Signed)
Spoke with pt. Pt was notified of lab results and will discuss results with his PCP.  ?

## 2021-08-01 ENCOUNTER — Other Ambulatory Visit: Payer: Self-pay

## 2021-08-01 ENCOUNTER — Ambulatory Visit (HOSPITAL_COMMUNITY)
Admission: RE | Admit: 2021-08-01 | Discharge: 2021-08-01 | Disposition: A | Discharge status: Home / Self Care | Payer: Medicare Other | Attending: Cardiology | Admitting: Cardiology

## 2021-08-01 ENCOUNTER — Encounter (HOSPITAL_COMMUNITY)
Admission: RE | Disposition: A | Discharge status: Home / Self Care | Payer: Self-pay | Source: Home / Self Care | Attending: Cardiology

## 2021-08-01 DIAGNOSIS — K551 Chronic vascular disorders of intestine: Secondary | ICD-10-CM | POA: Diagnosis not present

## 2021-08-01 DIAGNOSIS — Z79899 Other long term (current) drug therapy: Secondary | ICD-10-CM | POA: Insufficient documentation

## 2021-08-01 DIAGNOSIS — I517 Cardiomegaly: Secondary | ICD-10-CM

## 2021-08-01 DIAGNOSIS — E1151 Type 2 diabetes mellitus with diabetic peripheral angiopathy without gangrene: Secondary | ICD-10-CM | POA: Insufficient documentation

## 2021-08-01 DIAGNOSIS — I272 Pulmonary hypertension, unspecified: Secondary | ICD-10-CM | POA: Diagnosis not present

## 2021-08-01 DIAGNOSIS — I080 Rheumatic disorders of both mitral and aortic valves: Secondary | ICD-10-CM | POA: Diagnosis not present

## 2021-08-01 DIAGNOSIS — E785 Hyperlipidemia, unspecified: Secondary | ICD-10-CM | POA: Insufficient documentation

## 2021-08-01 DIAGNOSIS — I701 Atherosclerosis of renal artery: Secondary | ICD-10-CM | POA: Insufficient documentation

## 2021-08-01 DIAGNOSIS — Z7984 Long term (current) use of oral hypoglycemic drugs: Secondary | ICD-10-CM | POA: Insufficient documentation

## 2021-08-01 DIAGNOSIS — I119 Hypertensive heart disease without heart failure: Secondary | ICD-10-CM | POA: Insufficient documentation

## 2021-08-01 DIAGNOSIS — I452 Bifascicular block: Secondary | ICD-10-CM | POA: Insufficient documentation

## 2021-08-01 HISTORY — PX: RIGHT HEART CATH: CATH118263

## 2021-08-01 LAB — POCT I-STAT EG7
Acid-base deficit: 5 mmol/L — ABNORMAL HIGH (ref 0.0–2.0)
Acid-base deficit: 5 mmol/L — ABNORMAL HIGH (ref 0.0–2.0)
Bicarbonate: 19.1 mmol/L — ABNORMAL LOW (ref 20.0–28.0)
Bicarbonate: 19.7 mmol/L — ABNORMAL LOW (ref 20.0–28.0)
Calcium, Ion: 1.2 mmol/L (ref 1.15–1.40)
Calcium, Ion: 1.27 mmol/L (ref 1.15–1.40)
HCT: 36 % — ABNORMAL LOW (ref 39.0–52.0)
HCT: 36 % — ABNORMAL LOW (ref 39.0–52.0)
Hemoglobin: 12.2 g/dL — ABNORMAL LOW (ref 13.0–17.0)
Hemoglobin: 12.2 g/dL — ABNORMAL LOW (ref 13.0–17.0)
O2 Saturation: 70 %
O2 Saturation: 72 %
Potassium: 3.9 mmol/L (ref 3.5–5.1)
Potassium: 4.1 mmol/L (ref 3.5–5.1)
Sodium: 142 mmol/L (ref 135–145)
Sodium: 143 mmol/L (ref 135–145)
TCO2: 20 mmol/L — ABNORMAL LOW (ref 22–32)
TCO2: 21 mmol/L — ABNORMAL LOW (ref 22–32)
pCO2, Ven: 32.3 mmHg — ABNORMAL LOW (ref 44–60)
pCO2, Ven: 33.6 mmHg — ABNORMAL LOW (ref 44–60)
pH, Ven: 7.377 (ref 7.25–7.43)
pH, Ven: 7.379 (ref 7.25–7.43)
pO2, Ven: 37 mmHg (ref 32–45)
pO2, Ven: 38 mmHg (ref 32–45)

## 2021-08-01 LAB — GLUCOSE, CAPILLARY: Glucose-Capillary: 149 mg/dL — ABNORMAL HIGH (ref 70–99)

## 2021-08-01 SURGERY — RIGHT HEART CATH
Anesthesia: LOCAL

## 2021-08-01 MED ORDER — SODIUM CHLORIDE 0.9 % IV SOLN
250.0000 mL | INTRAVENOUS | Status: DC | PRN
Start: 2021-08-01 — End: 2021-08-01

## 2021-08-01 MED ORDER — SODIUM CHLORIDE 0.9 % IV SOLN: 250.0000 mL | INTRAVENOUS | Status: DC | PRN

## 2021-08-01 MED ORDER — HEPARIN (PORCINE) IN NACL 1000-0.9 UT/500ML-% IV SOLN
INTRAVENOUS | Status: DC | PRN
  Administered 2021-08-01: 500 mL

## 2021-08-01 MED ORDER — HYDRALAZINE HCL 20 MG/ML IJ SOLN
INTRAMUSCULAR | Status: DC | PRN
  Administered 2021-08-01: 10 mg via INTRAVENOUS

## 2021-08-01 MED ORDER — ASPIRIN 81 MG PO CHEW
CHEWABLE_TABLET | ORAL | Status: AC
  Filled 2021-08-01: qty 1

## 2021-08-01 MED ORDER — FENTANYL CITRATE (PF) 100 MCG/2ML IJ SOLN
INTRAMUSCULAR | Status: DC | PRN
  Administered 2021-08-01: 25 ug via INTRAVENOUS

## 2021-08-01 MED ORDER — SODIUM CHLORIDE 0.9% FLUSH: 3.0000 mL | INTRAVENOUS | Status: DC | PRN

## 2021-08-01 MED ORDER — FENTANYL CITRATE (PF) 100 MCG/2ML IJ SOLN
INTRAMUSCULAR | Status: AC
  Filled 2021-08-01: qty 2

## 2021-08-01 MED ORDER — LABETALOL HCL 5 MG/ML IV SOLN
INTRAVENOUS | Status: AC
  Filled 2021-08-01: qty 4

## 2021-08-01 MED ORDER — SODIUM CHLORIDE 0.9 % WEIGHT BASED INFUSION: 1.0000 mL/kg/h | INTRAVENOUS | Status: DC

## 2021-08-01 MED ORDER — FUROSEMIDE 20 MG PO TABS: 20.0000 mg | ORAL_TABLET | Freq: Every day | ORAL | 6 refills | Status: DC

## 2021-08-01 MED ORDER — SODIUM CHLORIDE 0.9 % WEIGHT BASED INFUSION: 3.0000 mL/kg/h | INTRAVENOUS | Status: AC

## 2021-08-01 MED ORDER — LABETALOL HCL 5 MG/ML IV SOLN: 10.0000 mg | INTRAVENOUS | Status: DC | PRN

## 2021-08-01 MED ORDER — MORPHINE SULFATE (PF) 2 MG/ML IV SOLN: 1.0000 mg | INTRAVENOUS | Status: DC | PRN

## 2021-08-01 MED ORDER — HEPARIN (PORCINE) IN NACL 1000-0.9 UT/500ML-% IV SOLN
INTRAVENOUS | Status: AC
  Filled 2021-08-01: qty 500

## 2021-08-01 MED ORDER — ASPIRIN 81 MG PO CHEW
81.0000 mg | CHEWABLE_TABLET | Freq: Once | ORAL | Status: AC
  Administered 2021-08-01: 81 mg via ORAL

## 2021-08-01 MED ORDER — LIDOCAINE HCL (PF) 1 % IJ SOLN
INTRAMUSCULAR | Status: DC | PRN
  Administered 2021-08-01: 2 mL

## 2021-08-01 MED ORDER — ACETAMINOPHEN 325 MG PO TABS: 650.0000 mg | ORAL_TABLET | ORAL | Status: DC | PRN

## 2021-08-01 MED ORDER — HYDRALAZINE HCL 20 MG/ML IJ SOLN: 10.0000 mg | INTRAMUSCULAR | Status: DC | PRN

## 2021-08-01 MED ORDER — HYDRALAZINE HCL 20 MG/ML IJ SOLN
INTRAMUSCULAR | Status: AC
  Filled 2021-08-01: qty 1

## 2021-08-01 MED ORDER — LABETALOL HCL 5 MG/ML IV SOLN
INTRAVENOUS | Status: DC | PRN
  Administered 2021-08-01 (×2): 10 mg via INTRAVENOUS

## 2021-08-01 MED ORDER — FUROSEMIDE 10 MG/ML IJ SOLN: 40.0000 mg | Freq: Once | INTRAMUSCULAR | Status: DC

## 2021-08-01 MED ORDER — ONDANSETRON HCL 4 MG/2ML IJ SOLN: 4.0000 mg | Freq: Four times a day (QID) | INTRAMUSCULAR | Status: DC | PRN

## 2021-08-01 MED ORDER — SODIUM CHLORIDE 0.9% FLUSH: 3.0000 mL | Freq: Two times a day (BID) | INTRAVENOUS | Status: DC

## 2021-08-01 MED ORDER — LIDOCAINE HCL (PF) 1 % IJ SOLN
INTRAMUSCULAR | Status: AC
  Filled 2021-08-01: qty 30

## 2021-08-01 SURGICAL SUPPLY — 9 items
CATH SWAN GANZ 7F STRAIGHT (CATHETERS) ×1 IMPLANT
GLIDESHEATH SLENDER 7FR .021G (SHEATH) ×1 IMPLANT
GUIDEWIRE .025 260CM (WIRE) ×2 IMPLANT
PACK CARDIAC CATHETERIZATION (CUSTOM PROCEDURE TRAY) ×1 IMPLANT
PROTECTION STATION PRESSURIZED (MISCELLANEOUS) ×2
STATION PROTECTION PRESSURIZED (MISCELLANEOUS) IMPLANT
TRANSDUCER W/MONITORING KIT (MISCELLANEOUS) ×1 IMPLANT
TUBING ART PRESS 72  MALE/FEM (TUBING) ×2
TUBING ART PRESS 72 MALE/FEM (TUBING) IMPLANT

## 2021-08-01 NOTE — Brief Op Note (Signed)
08/01/2021 ? ?2:14 PM ? ? ? ?SURGEON:  Surgeon(s) and Role: ?   * Marykay Lex, MD - Primary ? ?PROCEDURE:  Procedure(s): ?RIGHT HEART CATH (N/A) ? ?PATIENT:  Mark Cross  81 y.o. male with Severe Pulm HTN along with systemic hypertension (difficult control) noted on Pre-op echo for Hip Sgx.  He denies any significant dyspnea, PND or orthopnea, but because of the abnormal readings he is referred for invasive evaluation with right heart catheterization ? ?PRE-OPERATIVE DIAGNOSIS:  pulmonary hypertension; left ventricular hypertrophy ? ?POST-OPERATIVE DIAGNOSIS:   ?Severe systemic hypertension with blood pressures in the 210 to 220 mmHg range upon arrival => quite likely a significant portion of the elevated pulmonary pressures related to elevated systolic pressures. ?Moderate to Severe Pulmonary Hypertension: ?PAP 51/15 mmHg-mean 32 mmHg.  (With increase in systolic pressures from 203 to 224 mmHg, PA pressures increased up to 65 to 66 mmHg systolic. ?PCWP roughly 24 to 28 mmHg ?RAP mean 4 mmHg, RV P 60/0 mm with a EDP of 9 mmHg.Marland Kitchen ?Cardiac output-index: 7-4.05 by thermal dilution, Fick 5.81-3.37. ? ? ?Time Out: Verified patient identification, verified procedure, site/side was marked, verified correct patient position, special equipment/implants available, medications/allergies/relevent history reviewed, required imaging and test results available. Performed. ? ?Access:  ?* Right Brachial/Antecubital Vein: The existing 18-gauge IV was exchanged over a wire for a 7 sheath ? ?Right Heart Catheterization: 7 Fr Theone Murdoch catheter advanced under fluoroscopy with balloon inflated to the RA, RV, then PCWP-PA for hemodynamic measurement.  ?* Simultaneous FA & PA blood gases checked for SaO2% to calculate FICK CO/CI  ?* Thermodilution Injections performed to calculate CO/CI  ?Simultaneous PCWP/LV & RV/LV pressures monitored with Angled Pigtail in LV.  ?* Catheter removed completely out of the body with balloon  deflated. ? ?Brachial Sheath(s) removed in the Cath Lab with manual pressure for hemostasis.  ? ? ?MEDICATIONS ?* SQ Lidocaine 1 mL ?10 mg IV labetalol x2 ?10 mg IV hydralazine ? ? ?ANESTHESIA:   local and IV sedation; 1 mL SQ lidocaine, 25 mcg fentanyl IV ? ?EBL:  <20 mL ? ?DICTATION: .Note written in EPIC ? ?PLAN OF CARE: Discharge to home after PACU ?Recommend more adequate blood pressure control with the addition of a diuretic based on elevated LVEDP and PCWP. ?Can manage this in the outpatient setting  ?Would require close monitoring perioperatively, but not prohibitive risk. ? ?PATIENT DISPOSITION:  PACU - hemodynamically stable.  Systolic blood pressures were relatively high, but notably improved from starting pressures. ?  ?Delay start of Pharmacological VTE agent (>24hrs) due to surgical blood loss or risk of bleeding: no ? ? ?Bryan Lemma, MD ? ? ?

## 2021-08-01 NOTE — Telephone Encounter (Signed)
Patient is currently in the hospital for procedure. Will route to emily to make her aware. ?

## 2021-08-01 NOTE — Interval H&P Note (Signed)
History and Physical Interval Note: ? ?08/01/2021 ?1:39 PM ? ?Mark Cross  has presented today for surgery, with the diagnosis of pulmonary hypertension; left ventricular hypertrophy.  The various methods of treatment have been discussed with the patient and family. After consideration of risks, benefits and other options for treatment, the patient has consented to  Procedure(s): ?RIGHT HEART CATH (N/A) as a surgical intervention.  The patient's history has been reviewed, patient examined, no change in status, stable for surgery.  I have reviewed the patient's chart and labs.  Questions were answered to the patient's satisfaction.   ? ? ?Bryan Lemma ? ? ?

## 2021-08-02 ENCOUNTER — Encounter (HOSPITAL_COMMUNITY): Payer: Self-pay | Admitting: Cardiology

## 2021-08-02 NOTE — Telephone Encounter (Signed)
Patient wife made aware

## 2021-08-08 ENCOUNTER — Telehealth: Payer: Self-pay | Admitting: Cardiology

## 2021-08-08 NOTE — Telephone Encounter (Signed)
? ?  Pre-operative Risk Assessment  ?  ?Patient Name: Mark Cross  ?DOB: 1941/05/03 ?MRN: [id number]  ? ?  ? ?Request for Surgical Clearance   ? ?Procedure:   right total hip replacement  ? ?Date of Surgery:  Clearance TBD                              ?   ?Surgeon:  Margarita Rana MD ?Surgeon's Group or Practice Name:  [facility] ?Phone number:  [phone] ?Fax number:  [phone] (attn: Tresa Endo) ?  ?Type of Clearance Requested:   ?- Medical  ?  ?Type of Anesthesia:   not specified  ?  ?Additional requests/questions:   n/a ? ?Signed, ?Julaine Fusi M   ?08/08/2021, 5:04 PM  ? ?

## 2021-08-09 NOTE — Telephone Encounter (Signed)
? ?  Patient Name: Mark Cross  ?DOB: January 04, 1941 ?MRN: [id number] ? ?Primary Cardiologist: Glenetta Hew, MD ? ?Chart reviewed as part of pre-operative protocol coverage. The patient has an upcoming visit scheduled with Diona Browner, NP on 08/31/2021 at which time clearance can be addressed in case there are any issues that would impact surgical recommendations. ? ?Right total hip replacement date TBD.  I added preop FYI to appointment notes that provider is aware to address at time of outpatient visit.  Per office protocol the cardiology provider should for their finalize her decision and recommendations regarding antiplatelet therapy to requesting party below. ? ?I will route this message as FYI to requesting party and remove this message from the preop box as separate preop APP input not needed at this time.  Please call with any questions. ? ?Lenna Sciara, NP ?08/09/2021, 11:59 AM ? ?

## 2021-08-14 NOTE — Telephone Encounter (Signed)
Spoke to patient's wife -- ?Wife states, patient was told on 08/01/21 - he was cleared for hip surgery. ? Per wife the information has not been sent to Dr Karie Kirks (pcp) or  [facility] ( orthopedic)  ? ? She would like for  Cardiology to please send  the clearance. ? ?Wife states patient has always been taking Lisinopril 40 mg daily.  Dr Ellyn Hack started patient on furosemide 20 mg on 08/01/21. ? ?Wife aware will defer to Dr Ellyn Hack and Diona Browner NP ?

## 2021-08-15 NOTE — Telephone Encounter (Signed)
Spoke to wife .  Aware that answer for hip surgery clearance can not be done until  Dr Ellyn Hack comments. Dr Ellyn Hack will be in office on  08/20/21. ? RN informed wife contact her when clearance is ready  ?She verbalized understanding. ?

## 2021-08-20 NOTE — Telephone Encounter (Signed)
It looks like he has an APP appt on 4/28. ? ?Biggest concern is that his Systolic BP was significantly elevated -- that is going to be the main thing that needs to be controlled.  ?His Pulm HTN is mostly related to Diastolic Dysfunction - I.e. Secondary Pulm HTN & not likely related to underlying Lung Disease..  I wrote a Rx for Lasix upon d/c (not sure that he got it filled).   ? ?I think it best for [place] to confirm BP controlled before he goes to Pre-op -- my main concern is that he goes to Pre-op with High BP like he had in the Cath Lab.  If he goes in for pre-op & that is the case, he will not be approved.   ? ?Otherwise, I think he should be OK for surgery - as long as the Anesthesiologist known to monitor BP closely.  ? ? ? ?Glenetta Hew ? ?

## 2021-08-21 NOTE — Telephone Encounter (Signed)
Spoke with son who was concerned about surgical clearance for patient. I  gave him the following information... ?"The patient has an upcoming visit scheduled with Bernadene Person, NP on 08/31/2021 at which time clearance can be addressed in case there are any issues that would impact surgical recommendations." ?This was also placed in patient's MyChart. ?He had no other questions/concerns at this time. ?

## 2021-08-28 LAB — BASIC METABOLIC PANEL
BUN/Creatinine Ratio: 31 — ABNORMAL HIGH (ref 10–24)
BUN: 35 mg/dL — ABNORMAL HIGH (ref 8–27)
CO2: 22 mmol/L (ref 20–29)
Calcium: 10 mg/dL (ref 8.6–10.2)
Chloride: 103 mmol/L (ref 96–106)
Creatinine, Ser: 1.14 mg/dL (ref 0.76–1.27)
Glucose: 131 mg/dL — ABNORMAL HIGH (ref 70–99)
Potassium: 4.9 mmol/L (ref 3.5–5.2)
Sodium: 142 mmol/L (ref 134–144)
eGFR: 65 mL/min/{1.73_m2} (ref >59)

## 2021-08-29 ENCOUNTER — Telehealth: Payer: Self-pay

## 2021-08-29 NOTE — Telephone Encounter (Signed)
Spoke with pts spouse. Pt and spouse were notified of lab results. ?

## 2021-08-30 NOTE — Progress Notes (Signed)
? ? ?Office Visit  ?  ?Patient Name: Mark Cross ?Date of Encounter: 08/31/2021 ? ?Primary Care Provider:  Leslie Andrea, MD ?Primary Cardiologist:  Glenetta Hew, MD ? ?Chief Complaint  ?  ?81 year old male with a history of hypertension, LVH, pulmonary hypertension, bifascicular bundle branch block, murmur, PAD, bilateral renal artery stenosis, mesenteric artery stenosis, hyperlipidemia, type 2 diabetes, and osteoarthritis. who presents for follow-up related to hypertension, LVH, and pulmonary hypertension. ? ?Past Medical History  ?  ?Past Medical History:  ?Diagnosis Date  ? DJD (degenerative joint disease), lumbosacral   ? Hyperlipidemia   ? On statin  ? Hypertension   ? Resistant; on 4 medications.;  Doppler evidence of RAS-but does not meet criteria for RA revascularization by CORAL Trial  ? Osteoarthritis of right hip   ? Bone-on-bone pain more severe pain (Dr. Edmonia Lynch)  ? PAD (peripheral artery disease) ([place]) 03/14/2021  ? ABIs to evaluate right foot pain: R ABI 0.67, TBI 0.34.  L ABI 1.12, TBI 0.52.  Indicates moderate RLE arterial disease.  No significant L LE disease.  ? RAS (renal artery stenosis) ([place]) 03/2021  ? Seen by Dr. Gwenlyn Saran from vascular surgery for bilateral RAS and SMA stenosis given poorly controlled hypertension.  Recommended to continue medical management as he did not meet criteria based on CORAL Trial.  ? Type 2 diabetes mellitus without complication ([place])   ? On metformin only.  ? ?Past Surgical History:  ?Procedure Laterality Date  ? INGUINAL HERNIA REPAIR Bilateral 08/10/2012  ? Procedure: HERNIA REPAIR INGUINAL ADULT;  Surgeon: Scherry Ran, MD;  Location: [facility];  Service: General;  Laterality: Bilateral;  ? RIGHT HEART CATH N/A 08/01/2021  ? Procedure: RIGHT HEART CATH;  Surgeon: Leonie Man, MD;  Location: [facility];  Service: Cardiovascular;  Laterality: N/A;  ? ? ?Allergies ? ?No Known Allergies ? ?History of Present Illness  ?  ?81 year old  male with the above past medical history including hypertension, LVH, pulmonary hypertension, bifascicular bundle branch block, murmur, PAD, bilateral renal artery stenosis, mesenteric artery stenosis, hyperlipidemia, type 2 diabetes, and osteoarthritis.  ?  ?He has a history of hypertension with bilateral renal artery stenosis/SMA stenosis. He was previously evaluated by vascular surgery for this, medical therapy recommended. ABIs in the setting of right foot pain showed moderate RLE arterial disease.  He established care with Dr. Ellyn Hack on 07/06/2021 in the setting of preoperative cardiac evaluation. EKG showed bifascicular block.  He was noted to have systolic ejection murmur on exam.  Echocardiogram was recommended which showed EF 65 to 70%, hyperdynamic LV function, severe concentric LVH, grade 2 diastolic dysfunction, elevated left atrial pressure, mildly enlarged RV, severe elevated pulmonary arterial systolic pressure, severe LAE, moderate RAE, circumferential pericardial effusion, moderate mitral valve regurgitation, aortic valve sclerosis without evidence of aortic stenosis.  Findings consistent with hypertrophic cardiomyopathy, no evidence of LVOT obstruction likely in the setting of poorly controlled hypertension.  Follow-up right heart catheterization was recommended.  He was last seen in the office on 07/26/2021 and was stable overall from cardiac standpoint.  His BP was elevated. He reported significant pain from his right hip arthritis, for which he needs surgery.  Per Dr. Allison Quarry recommendation, he underwent right heart catheterization on 08/01/2021 which showed moderate to severe pulmonary hypertension, with a significant increase in PA pressures with increase in SBP.  He was started on furosemide. His pulmonary hypertension was thought to be related to diastolic dysfunction rather than underlying lung  disease.  Adequate BP control was recommended to help manage PA pressures. ? ?He presents today  for follow-up accompanied by his son. Since his procedure (RHC) he has done well from a cardiac standpoint. He denies dyspnea, edema, PND, orthopnea.  He denies symptoms concerning for angina. He states his BP has been better controlled at home with readings in the 140s-150s SBP (this appears to be his baseline historically).  BP is elevated in office today, however, he walked from his car and is in significant pain. He is very eager to proceed with right hip surgery. Other than his significant hip pain, he denies any additional concerns today. ? ?Home Medications  ?  ?Current Outpatient Medications  ?Medication Sig Dispense Refill  ? acetaminophen (TYLENOL) 500 MG tablet Take 500-1,000 mg by mouth 3 (three) times daily as needed (for pain.).    ? atenolol (TENORMIN) 100 MG tablet Take 100 mg by mouth in the morning.    ? chlorthalidone (HYGROTON) 25 MG tablet Take 1 tablet (25 mg total) by mouth daily. 30 tablet 3  ? furosemide (LASIX) 20 MG tablet Take 1 tablet (20 mg total) by mouth daily. 30 tablet 6  ? hydrALAZINE (APRESOLINE) 50 MG tablet Take 1 tablet (50 mg total) by mouth 3 (three) times daily. (Patient taking differently: Take 100 mg by mouth 3 (three) times daily.) 90 tablet 2  ? lisinopril (ZESTRIL) 40 MG tablet Take 40 mg by mouth in the morning.    ? metFORMIN (GLUCOPHAGE) 1000 MG tablet Take 1,000 mg by mouth 2 (two) times daily with a meal.    ? simvastatin (ZOCOR) 40 MG tablet Take 40 mg by mouth in the morning.    ? verapamil (VERELAN PM) 360 MG 24 hr capsule Take 360 mg by mouth at bedtime.    ? ?Current Facility-Administered Medications  ?Medication Dose Route Frequency Provider Last Rate Last Admin  ? sodium chloride flush (NS) 0.9 % injection 3 mL  3 mL Intravenous Q12H Elwin Tsou, Helane Gunther, NP      ?  ? ?Review of Systems  ?  ?He denies chest pain, palpitations, dyspnea, pnd, orthopnea, n, v, dizziness, syncope, edema, weight gain, or early satiety. All other systems reviewed and are otherwise  negative except as noted above.  ? ?Physical Exam  ?  ?VS:  BP (!) 170/64   Pulse (!) 58   Ht 5\' 8"  (1.727 m)   Wt 130 lb 12.8 oz (59.3 kg)   SpO2 97%   BMI 19.89 kg/m?  ?GEN: Well nourished, well developed, in no acute distress. ?HEENT: normal. ?Neck: Supple, no JVD, carotid bruits, or masses. ?Cardiac: RRR, no murmurs, rubs, or gallops. No clubbing, cyanosis, edema.  Radials/DP/PT 2+ and equal bilaterally.  ?Respiratory:  Respirations regular and unlabored, clear to auscultation bilaterally. ?GI: Soft, nontender, nondistended, BS + x 4. ?MS: no deformity or atrophy. ?Skin: warm and dry, no rash. ?Neuro:  Strength and sensation are intact. ?Psych: Normal affect. ? ?Accessory Clinical Findings  ?  ?ECG personally reviewed by me today -sinus bradycardia, 58 bpm, bifascicular block- no acute changes. ? ?Lab Results  ?Component Value Date  ? WBC 10.8 07/26/2021  ? HGB 12.2 (L) 08/01/2021  ? HGB 12.2 (L) 08/01/2021  ? HCT 36.0 (L) 08/01/2021  ? HCT 36.0 (L) 08/01/2021  ? MCV 88 07/26/2021  ? PLT 250 07/26/2021  ? ?Lab Results  ?Component Value Date  ? CREATININE 1.14 08/27/2021  ? BUN 35 (H) 08/27/2021  ? NA 142  08/27/2021  ? K 4.9 08/27/2021  ? CL 103 08/27/2021  ? CO2 22 08/27/2021  ? ?No results found for: ALT, AST, GGT, ALKPHOS, BILITOT ?No results found for: CHOL, HDL, LDLCALC, LDLDIRECT, TRIG, CHOLHDL  ?No results found for: HGBA1C ? ?Assessment & Plan  ?  ?1. Hypertension: BP improved with addition of Lasix. Home SBP readings in the 140s-150s.  Is to be his baseline.  His BP is elevated in office today, however, this is likely in the setting of physical activity and severe right hip pain.  Will transition from hydrochlorothiazide to chlorthalidone 25 mg daily.  In the future, could consider switching atenolol to carvedilol, and possibly switching verapamil to amlodipine.  For now, continue atenolol, chlorthalidone, lisinopril, hydralazine, and verapamil.  ? ?2. LVH/pulmonary hypertension: Recent echo showed  EF 65-70%, hyperdynamic LV function, severe concentric LVH, grade 2 diastolic dysfunction, elevated left atrial pressure, mildly enlarged RV, severe elevated pulmonary arterial systolic pressure, severe LAE, mod

## 2021-08-31 ENCOUNTER — Encounter: Payer: Self-pay | Admitting: Nurse Practitioner

## 2021-08-31 ENCOUNTER — Ambulatory Visit: Payer: Medicare Other | Admitting: Nurse Practitioner

## 2021-08-31 VITALS — BP 170/64 | HR 58 | Ht 68.0 in | Wt 130.8 lb

## 2021-08-31 DIAGNOSIS — I452 Bifascicular block: Secondary | ICD-10-CM

## 2021-08-31 DIAGNOSIS — I517 Cardiomegaly: Secondary | ICD-10-CM

## 2021-08-31 DIAGNOSIS — E782 Mixed hyperlipidemia: Secondary | ICD-10-CM

## 2021-08-31 DIAGNOSIS — E119 Type 2 diabetes mellitus without complications: Secondary | ICD-10-CM

## 2021-08-31 DIAGNOSIS — I272 Pulmonary hypertension, unspecified: Secondary | ICD-10-CM

## 2021-08-31 DIAGNOSIS — I701 Atherosclerosis of renal artery: Secondary | ICD-10-CM

## 2021-08-31 DIAGNOSIS — K551 Chronic vascular disorders of intestine: Secondary | ICD-10-CM

## 2021-08-31 DIAGNOSIS — Z01810 Encounter for preprocedural cardiovascular examination: Secondary | ICD-10-CM

## 2021-08-31 DIAGNOSIS — I10 Essential (primary) hypertension: Secondary | ICD-10-CM

## 2021-08-31 DIAGNOSIS — I739 Peripheral vascular disease, unspecified: Secondary | ICD-10-CM

## 2021-08-31 MED ORDER — CHLORTHALIDONE 25 MG PO TABS: 25.0000 mg | ORAL_TABLET | Freq: Every day | ORAL | 3 refills | Status: DC

## 2021-08-31 NOTE — Patient Instructions (Signed)
Medication Instructions:  ?Stop Hydrochlorothiazide.  ?Start Chlorthalidone 25 mg daily ? ?*If you need a refill on your cardiac medications before your next appointment, please call your pharmacy* ? ? ?Lab Work: ?NONE ordered at this time of appointment  ? ? ?Testing/Procedures: ?NONE ordered at this time of appointment   ? ?Follow-Up: ?At [facility], you and your health needs are our priority.  As part of our continuing mission to provide you with exceptional heart care, we have created designated Provider Care Teams.  These Care Teams include your primary Cardiologist (physician) and Advanced Practice Providers (APPs -  Physician Assistants and Nurse Practitioners) who all work together to provide you with the care you need, when you need it. ? ?We recommend signing up for the patient portal called "MyChart".  Sign up information is provided on this After Visit Summary.  MyChart is used to connect with patients for Virtual Visits (Telemedicine).  Patients are able to view lab/test results, encounter notes, upcoming appointments, etc.  Non-urgent messages can be sent to your provider as well.   ?To learn more about what you can do with MyChart, go to [email or web address].   ? ?Your next appointment:   ? Keep 11/09/2021 appointment  ? ?The format for your next appointment:   ?In Person ? ?Provider:   ?Bryan Lemma, MD   ? ? ?Other Instructions ? ? ?Important Information About Sugar ? ? ? ? ? ? ?

## 2021-09-17 NOTE — H&P (Signed)
HIP ARTHROPLASTY ADMISSION H&P ? ?Patient ID: ?Mark Cross ?MRN: [id number] ?DOB/AGE: 06-Feb-1941 81 y.o. ? ?Chief Complaint: right hip pain. ? ?Planned Procedure Date: 10/02/21 ?Medical Clearance by Dr. Karie Kirks   ?Cardiac Clearance by Diona Browner NP-C ?Vascular clearance by Dr. Unk Lightning  ? ?HPI: ?Mark Cross is a 81 y.o. male who presents for evaluation of OA RIGHT HIP. The patient has a history of pain and functional disability in the right hip due to arthritis and has failed non-surgical conservative treatments for greater than 12 weeks to include NSAID's and/or analgesics, use of assistive devices, and activity modification.  Onset of symptoms was gradual, starting 2 years ago with gradually worsening course since that time. The patient noted no past surgery on the right hip.  Patient currently rates pain at 9 out of 10 with activity. Patient has night pain, worsening of pain with activity and weight bearing, and pain that interferes with activities of daily living.  Patient has evidence of subchondral sclerosis, periarticular osteophytes, joint space narrowing, and osteolysis of the femoral head  by imaging studies.  There is no active infection. ? ?Past Medical History:  ?Diagnosis Date  ? DJD (degenerative joint disease), lumbosacral   ? Hyperlipidemia   ? On statin  ? Hypertension   ? Resistant; on 4 medications.;  Doppler evidence of RAS-but does not meet criteria for RA revascularization by CORAL Trial  ? Osteoarthritis of right hip   ? Bone-on-bone pain more severe pain (Dr. Edmonia Lynch)  ? PAD (peripheral artery disease) ([employer]) 03/14/2021  ? ABIs to evaluate right foot pain: R ABI 0.67, TBI 0.34.  L ABI 1.12, TBI 0.52.  Indicates moderate RLE arterial disease.  No significant L LE disease.  ? RAS (renal artery stenosis) ([place]) 03/2021  ? Seen by Dr. Gwenlyn Saran from vascular surgery for bilateral RAS and SMA stenosis given poorly controlled hypertension.  Recommended to continue medical management as  he did not meet criteria based on CORAL Trial.  ? Type 2 diabetes mellitus without complication ([place])   ? On metformin only.  ? ?Past Surgical History:  ?Procedure Laterality Date  ? INGUINAL HERNIA REPAIR Bilateral 08/10/2012  ? Procedure: HERNIA REPAIR INGUINAL ADULT;  Surgeon: Scherry Ran, MD;  Location: [facility];  Service: General;  Laterality: Bilateral;  ? RIGHT HEART CATH N/A 08/01/2021  ? Procedure: RIGHT HEART CATH;  Surgeon: Leonie Man, MD;  Location: [facility];  Service: Cardiovascular;  Laterality: N/A;  ? ?No Known Allergies ?Prior to Admission medications   ?Medication Sig Start Date End Date Taking? Authorizing Provider  ?acetaminophen (TYLENOL) 500 MG tablet Take 500-1,000 mg by mouth 3 (three) times daily as needed (for pain.).    [provider]  ?atenolol (TENORMIN) 100 MG tablet Take 100 mg by mouth in the morning.    [provider]  ?chlorthalidone (HYGROTON) 25 MG tablet Take 1 tablet (25 mg total) by mouth daily. 08/31/21   Lenna Sciara, NP  ?furosemide (LASIX) 20 MG tablet Take 1 tablet (20 mg total) by mouth daily. 08/01/21 08/01/22  Leonie Man, MD  ?hydrALAZINE (APRESOLINE) 50 MG tablet Take 1 tablet (50 mg total) by mouth 3 (three) times daily. ?Patient taking differently: Take 100 mg by mouth 3 (three) times daily. 07/07/21 10/05/21  Leonie Man, MD  ?lisinopril (ZESTRIL) 40 MG tablet Take 40 mg by mouth in the morning.    [provider]  ?metFORMIN (GLUCOPHAGE) 1000 MG tablet Take 1,000  mg by mouth 2 (two) times daily with a meal.    [provider]  ?simvastatin (ZOCOR) 40 MG tablet Take 40 mg by mouth in the morning.    [provider]  ?verapamil (VERELAN PM) 360 MG 24 hr capsule Take 360 mg by mouth at bedtime.    [provider]  ? ?Social History  ? ?Socioeconomic History  ? Marital status: Married  ?  Spouse name: Not on file  ? Number of children: Not on file  ? Years of education: Not on file  ?  Highest education level: Not on file  ?Occupational History  ? Not on file  ?Tobacco Use  ? Smoking status: Former  ? Smokeless tobacco: Never  ?Substance and Sexual Activity  ? Alcohol use: Yes  ?  Comment: seldom  ? Drug use: No  ? Sexual activity: Not on file  ?Other Topics Concern  ? Not on file  ?Social History Narrative  ? Not on file  ? ?Social Determinants of Health  ? ?Financial Resource Strain: Not on file  ?Food Insecurity: Not on file  ?Transportation Needs: Not on file  ?Physical Activity: Not on file  ?Stress: Not on file  ?Social Connections: Not on file  ? ?No family history on file. ? ?ROS: Currently denies lightheadedness, dizziness, Fever, chills, CP, SOB.   ?No personal history of DVT, PE, MI, or CVA. 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 ? ?Objective: ?Vitals: Ht: 5'8" Wt: 132 lbs Temp: 97.7 BP: 213/63 Pulse: 98 O2 98% on room air.   ?Physical Exam: ?General: Alert, NAD. Trendelenberg Gait. Barely able to ambulate even with walker  ?HEENT: EOMI, Good Neck Extension  ?Pulm: No increased work of breathing.  Clear B/L A/P w/o crackle or wheeze.  ?CV: RRR, No g/r appreciated. Loud, harsh, blowing holosystolic murmur best heard at apex with radiation to axilla ?GI: soft, NT, ND. BS x 4 quadrants ?Neuro: CN II-XII grossly intact without focal deficit.  Sensation intact distally ?Skin: No lesions in the area of chief complaint ?MSK/Surgical Site: + TTP. Limited hip ROM due to pain. - [place]. + SLR. + FABER/FADIR. 5/5 strength.  NVI.   ? ?Imaging Review ?Plain radiographs demonstrate severe degenerative joint disease of the right hip.  ? ?The bone quality appears to be poor for age and reported activity level. ? ?Preoperative templating of the joint replacement has been completed, documented, and submitted to the Operating Room  personnel in order to optimize intra-operative equipment management. ? ?Assessment: ?OA RIGHT HIP ?Active Problems: ?  * No active hospital problems. * ? ? ?Plan: ?Plan for Procedure(s): ?TOTAL HIP ARTHROPLASTY ANTERIOR APPROACH ? ?The patient history, physical exam, clinical judgement of the provider and imaging are consistent with end stage degenerative joint disease and total joint arthroplasty is deemed medically necessary. The treatment options including medical management, injection therapy, and arthroplasty were discussed at length. The risks and benefits of Procedure(s): ?TOTAL HIP ARTHROPLASTY ANTERIOR APPROACH were presented and reviewed.  ?The risks of nonoperative treatment, versus surgical intervention including but not limited to continued pain, aseptic loosening, stiffness, dislocation/subluxation, infection, bleeding, nerve injury, blood clots, cardiopulmonary complications, morbidity, mortality, among others were discussed. The patient verbalizes understanding and wishes to proceed with the plan.  ?Patient is being admitted for surgery, pain control, PT, prophylactic antibiotics, VTE prophylaxis, progressive ambulation,  ADL's and discharge planning. He may need to spend the night in observation if his blood pressures continue to be elevated.  ? ?Dental prophylaxis discussed and recommended for 2 years postoperatively. ? ?The patient does meet the criteria for TXA which will be used perioperatively.   ?Xarelto 10mg  daily will be used postoperatively for DVT prophylaxis in addition to SCDs, and early ambulation. ?Plan for Oxycodone, Mobic Tylenol for pain.   ?Robaxin for muscle spasm.  ?Zofran for nausea and vomiting. ?Miralax for constipation ?Pharmacy- [place] ?The patient is planning to be discharged home with OPPT and into the care of his wife Mark Cross who can be reached at [phone] ?Follow up appt 10/17/21 at 4:15pm ? ? ? ? ?Britt Bottom, PA-C ?Office [phone] ?09/17/2021 ?4:38  PM ?  ?

## 2021-09-24 NOTE — Progress Notes (Signed)
Anesthesia Review:  PCP: Cardiologist : Roddie Mc, NP 08/31/21  Chest x-ray : EKG :08/31/21  Echo : 07/12/21  Stress test: Cardiac Cath :  08/01/21  Activity level:  Sleep Study/ CPAP : Fasting Blood Sugar :      / Checks Blood Sugar -- times a day:   Blood Thinner/ Instructions /Last Dose: ASA / Instructions/ Last Dose :   EM- type  Hgba1c-

## 2021-09-24 NOTE — Progress Notes (Signed)
DUE TO COVID-19 ONLY  2 VISITOR IS ALLOWED TO COME WITH YOU AND STAY IN THE WAITING ROOM ONLY DURING PRE OP AND PROCEDURE DAY OF SURGERY.   4 VISITOR  MAY VISIT WITH YOU AFTER SURGERY IN YOUR PRIVATE ROOM DURING VISITING HOURS ONLY! YOU MAY HAVE ONE PERSON SPEND THE NITE WITH YOU IN YOUR ROOM AFTER SURGERY.     Your procedure is scheduled on:    10/02/21   Report to [facility] Main  Entrance   Report to admitting at         0515        AM DO NOT BRING INSURANCE CARD, PICTURE ID OR WALLET DAY OF SURGERY.      Call this number if you have problems the morning of surgery [phone]    REMEMBER: NO  SOLID FOODS , CANDY, GUM OR MINTS AFTER MIDNITE THE NITE BEFORE SURGERY .       Marland Kitchen CLEAR LIQUIDS UNTIL    0430am             DAY OF SURGERY.      PLEASE FINISH  g2 lower sugar DRINK PER SURGEON ORDER  WHICH NEEDS TO BE COMPLETED AT    0430am      MORNING OF SURGERY.       CLEAR LIQUID DIET   Foods Allowed      WATER BLACK COFFEE ( SUGAR OK, NO MILK, CREAM OR CREAMER) REGULAR AND DECAF  TEA ( SUGAR OK NO MILK, CREAM, OR CREAMER) REGULAR AND DECAF  PLAIN JELLO ( NO RED)  FRUIT ICES ( NO RED, NO FRUIT PULP)  POPSICLES ( NO RED)  JUICE- APPLE, WHITE GRAPE AND WHITE CRANBERRY  SPORT DRINK LIKE GATORADE ( NO RED)  CLEAR BROTH ( VEGETABLE , CHICKEN OR BEEF)                                                                     BRUSH YOUR TEETH MORNING OF SURGERY AND RINSE YOUR MOUTH OUT, NO CHEWING GUM CANDY OR MINTS.     Take these medicines the morning of surgery with A SIP OF WATER:  atenolol, hydralazine    DO NOT TAKE ANY DIABETIC MEDICATIONS DAY OF YOUR SURGERY                               You may not have any metal on your body including hair pins and              piercings  Do not wear jewelry, make-up, lotions, powders or perfumes, deodorant             Do not wear nail polish on your fingernails.              IF YOU ARE A MALE AND WANT TO SHAVE UNDER ARMS OR LEGS  PRIOR TO SURGERY YOU MUST DO SO AT LEAST 48 HOURS PRIOR TO SURGERY.              Men may shave face and neck.   Do not bring valuables to the hospital. [facility] IS NOT  RESPONSIBLE   FOR VALUABLES.  Contacts, dentures or bridgework may not be worn into surgery.  Leave suitcase in the car. After surgery it may be brought to your room.     Patients discharged the day of surgery will not be allowed to drive home. IF YOU ARE HAVING SURGERY AND GOING HOME THE SAME DAY, YOU MUST HAVE AN ADULT TO DRIVE YOU HOME AND BE WITH YOU FOR 24 HOURS. YOU MAY GO HOME BY TAXI OR UBER OR ORTHERWISE, BUT AN ADULT MUST ACCOMPANY YOU HOME AND STAY WITH YOU FOR 24 HOURS.                Please read over the following fact sheets you were given: _____________________________________________________________________  [facility] - Preparing for Surgery Before surgery, you can play an important role.  Because skin is not sterile, your skin needs to be as free of germs as possible.  You can reduce the number of germs on your skin by washing with CHG (chlorahexidine gluconate) soap before surgery.  CHG is an antiseptic cleaner which kills germs and bonds with the skin to continue killing germs even after washing. Please DO NOT use if you have an allergy to CHG or antibacterial soaps.  If your skin becomes reddened/irritated stop using the CHG and inform your nurse when you arrive at Short Stay. Do not shave (including legs and underarms) for at least 48 hours prior to the first CHG shower.  You may shave your face/neck. Please follow these instructions carefully:  1.  Shower with CHG Soap the night before surgery and the  morning of Surgery.  2.  If you choose to wash your hair, wash your hair first as usual with your  normal  shampoo.  3.  After you shampoo, rinse your hair and body thoroughly to remove the  shampoo.                           4.  Use CHG as you would any other liquid soap.  You can apply chg  directly  to the skin and wash                       Gently with a scrungie or clean washcloth.  5.  Apply the CHG Soap to your body ONLY FROM THE NECK DOWN.   Do not use on face/ open                           Wound or open sores. Avoid contact with eyes, ears mouth and genitals (private parts).                       Wash face,  Genitals (private parts) with your normal soap.             6.  Wash thoroughly, paying special attention to the area where your surgery  will be performed.  7.  Thoroughly rinse your body with warm water from the neck down.  8.  DO NOT shower/wash with your normal soap after using and rinsing off  the CHG Soap.                9.  Pat yourself dry with a clean towel.            10.  Wear clean pajamas.  11.  Place clean sheets on your bed the night of your first shower and do not  sleep with pets. Day of Surgery : Do not apply any lotions/deodorants the morning of surgery.  Please wear clean clothes to the hospital/surgery center.  FAILURE TO FOLLOW THESE INSTRUCTIONS MAY RESULT IN THE CANCELLATION OF YOUR SURGERY PATIENT SIGNATURE_________________________________  NURSE SIGNATURE__________________________________  ________________________________________________________________________

## 2021-09-26 ENCOUNTER — Encounter (HOSPITAL_COMMUNITY): Payer: Self-pay

## 2021-09-26 ENCOUNTER — Other Ambulatory Visit: Payer: Self-pay

## 2021-09-26 ENCOUNTER — Encounter (HOSPITAL_COMMUNITY)
Admission: RE | Admit: 2021-09-26 | Discharge: 2021-09-26 | Disposition: A | Discharge status: Home / Self Care | Payer: Medicare Other | Source: Ambulatory Visit | Attending: Orthopedic Surgery | Admitting: Orthopedic Surgery

## 2021-09-26 VITALS — BP 196/60 | HR 65 | Temp 97.9°F | Resp 16 | Ht 67.5 in | Wt 129.0 lb

## 2021-09-26 DIAGNOSIS — Z01812 Encounter for preprocedural laboratory examination: Secondary | ICD-10-CM | POA: Diagnosis present

## 2021-09-26 DIAGNOSIS — I701 Atherosclerosis of renal artery: Secondary | ICD-10-CM | POA: Insufficient documentation

## 2021-09-26 DIAGNOSIS — I272 Pulmonary hypertension, unspecified: Secondary | ICD-10-CM | POA: Insufficient documentation

## 2021-09-26 DIAGNOSIS — M1611 Unilateral primary osteoarthritis, right hip: Secondary | ICD-10-CM | POA: Diagnosis not present

## 2021-09-26 DIAGNOSIS — E1151 Type 2 diabetes mellitus with diabetic peripheral angiopathy without gangrene: Secondary | ICD-10-CM | POA: Diagnosis not present

## 2021-09-26 DIAGNOSIS — K551 Chronic vascular disorders of intestine: Secondary | ICD-10-CM | POA: Insufficient documentation

## 2021-09-26 DIAGNOSIS — Z01818 Encounter for other preprocedural examination: Secondary | ICD-10-CM

## 2021-09-26 DIAGNOSIS — E119 Type 2 diabetes mellitus without complications: Secondary | ICD-10-CM | POA: Insufficient documentation

## 2021-09-26 DIAGNOSIS — I10 Essential (primary) hypertension: Secondary | ICD-10-CM | POA: Diagnosis not present

## 2021-09-26 LAB — BASIC METABOLIC PANEL
Anion gap: 11 (ref 5–15)
BUN: 38 mg/dL — ABNORMAL HIGH (ref 8–23)
CO2: 26 mmol/L (ref 22–32)
Calcium: 9.6 mg/dL (ref 8.9–10.3)
Chloride: 105 mmol/L (ref 98–111)
Creatinine, Ser: 1.18 mg/dL (ref 0.61–1.24)
GFR, Estimated: >60 mL/min (ref >60)
Glucose, Bld: 101 mg/dL — ABNORMAL HIGH (ref 70–99)
Potassium: 4 mmol/L (ref 3.5–5.1)
Sodium: 142 mmol/L (ref 135–145)

## 2021-09-26 LAB — HEMOGLOBIN A1C
Hgb A1c MFr Bld: 6.5 % — ABNORMAL HIGH (ref 4.8–5.6)
Mean Plasma Glucose: 139.85 mg/dL

## 2021-09-26 LAB — CBC
HCT: 39.5 % (ref 39.0–52.0)
Hemoglobin: 12.4 g/dL — ABNORMAL LOW (ref 13.0–17.0)
MCH: 29.7 pg (ref 26.0–34.0)
MCHC: 31.4 g/dL (ref 30.0–36.0)
MCV: 94.5 fL (ref 80.0–100.0)
Platelets: 262 10*3/uL (ref 150–400)
RBC: 4.18 MIL/uL — ABNORMAL LOW (ref 4.22–5.81)
RDW: 13.8 % (ref 11.5–15.5)
WBC: 10.5 10*3/uL (ref 4.0–10.5)
nRBC: 0 % (ref 0.0–0.2)

## 2021-09-26 LAB — SURGICAL PCR SCREEN
MRSA, PCR: NEGATIVE
Staphylococcus aureus: NEGATIVE

## 2021-09-26 LAB — GLUCOSE, CAPILLARY: Glucose-Capillary: 101 mg/dL — ABNORMAL HIGH (ref 70–99)

## 2021-09-27 NOTE — Anesthesia Preprocedure Evaluation (Addendum)
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 There is no evidence of systolic anterior motion of the mitral valve  and there is no dynamic LV outflow tract obstruction at rest or with the  Valsalva maneuver. Left ventricular ejection fraction, by estimation, is  65 to 70%. Left ventricular  ejection fraction by 3D volume is 65 %. The left ventricle has  hyperdynamic function. The left ventricle has no regional wall motion  abnormalities. There is severe concentric left ventricular hypertrophy.  Left ventricular diastolic parameters are  consistent with Grade II diastolic dysfunction (pseudonormalization).  Elevated left atrial pressure.  2. Right ventricular systolic function is normal. The right ventricular  size is mildly enlarged. Mildly increased right ventricular wall  thickness. There is severely elevated pulmonary artery systolic pressure.  The estimated right ventricular systolic  pressure is [id number] mmHg.  3. Left atrial size was severely dilated.  4. Right atrial size was moderately dilated.  5. The pericardial effusion is circumferential.  6. The mitral valve is degenerative. Moderate mitral valve regurgitation.  No evidence of mitral stenosis. The mean mitral valve gradient is 3.0 mmHg  with average heart rate of 77 bpm.  7. Tricuspid valve regurgitation is mild to moderate.  8. The aortic valve is tricuspid. There is mild calcification of the  aortic valve. There is moderate  thickening of the aortic valve. Aortic  valve regurgitation is not visualized. Aortic valve  sclerosis/calcification is present, without any evidence of  aortic stenosis.  9. The inferior vena cava is normal in size with <50% respiratory  variability, suggesting right atrial pressure of 8 mmHg.   Conclusion(s)/Recommendation(s): Findings consistent with hypertrophic  cardiomyopathy. No evidence of LVOT obstruction. Findings may be due to  untreated hypertension, but consider evaluation for amyloidosis or genetic  HCM if HTN is not present.    Neuro/Psych negative neurological ROS  negative psych ROS   GI/Hepatic negative GI ROS, Neg liver ROS,   Endo/Other  diabetes, Type 2  Renal/GU Lab Results      Component                Value               Date                      CREATININE               1.18                09/26/2021               K                        4.0                 09/26/2021                         Musculoskeletal  (+)  Arthritis , OA R Hip   Abdominal   Peds  Hematology Lab Results      Component                Value               Date                      WBC                      10.5                09/26/2021                HGB                      12.4 (L)            09/26/2021                HCT                      39.5                09/26/2021                MCV                      94.5                09/26/2021                PLT                      262                 09/26/2021              Anesthesia Other Findings   Reproductive/Obstetrics                           Anesthesia Physical Anesthesia Plan  ASA: 3  Anesthesia Plan: General   Post-op Pain Management: Ofirmev IV (intra-op)* and Lidocaine infusion*   Induction: Intravenous  PONV Risk Score and Plan: 4 or greater and Treatment may vary due to age or medical condition and Ondansetron  Airway Management Planned: Oral ETT  Additional Equipment:  Arterial line  Intra-op Plan:   Post-operative Plan: Extubation in OR  Informed Consent: I have reviewed the patients History and Physical, chart, labs and discussed the procedure including the risks, benefits and alternatives for the proposed anesthesia with the patient or authorized representative who has indicated his/her understanding and acceptance.     Dental advisory given  Plan Discussed with:   Anesthesia Plan Comments: (See PAT note 09/26/2021, Lyndon Code, PA-C)      Anesthesia Quick Evaluation

## 2021-09-27 NOTE — Progress Notes (Signed)
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 Edmonia Lynch.   Pt last seen by cardiology 08/31/2021. Per OV note, "Preoperative cardiac evaluation: According to the Revised Cardiac Risk Index (RCRI), his Perioperative Risk of Major Cardiac Event is (%): 0.4. His Functional Capacity in METs is: 4.18 according to the [facility] Activity Status Index (DASI). Therefore, based on ACC/AHA guidelines, patient would be at acceptable risk for the planned procedure without further cardiovascular testing. I will route this recommendation to the requesting party via [place] fax function."  Per Dr. Ellyn Hack, "Biggest concern is that his Systolic BP was significantly elevated -- that is going to be the main thing that needs to be controlled.  His Pulm HTN is mostly related to Diastolic Dysfunction - I.e. Secondary Pulm HTN & not likely related to underlying Lung Disease..  I wrote a Rx for Lasix upon d/c (not sure that he got it filled).   I think it best for [place] to confirm BP controlled before he goes to Pre-op -- my main concern is that he goes to Pre-op with High BP like he had in the Cath Lab.  If he goes in for pre-op & that is the case, he will not be approved.  Otherwise, I think he should be OK for surgery - as long as the Anesthesiologist known to monitor BP closely."  BP Readings from Last 3 Encounters:  09/26/21 (!) 196/60  08/31/21 (!) 170/64  08/01/21 (!) 177/54   Anticipate pt can proceed with planned procedure barring acute status change and after evaluation DOS by anesthesia.   VS: BP (!) 196/60   Pulse 65   Temp 36.6 C (Oral)   Resp 16   Ht 5' 7.5" (1.715 m)   Wt 58.5 kg   SpO2 100%   BMI 19.91 kg/m   PROVIDERS: Leslie Andrea, MD is PCP   Primary Cardiologist:  Glenetta Hew, MD  LABS: Labs reviewed: Acceptable for surgery. (all labs ordered are listed, but only abnormal results are displayed)  Labs Reviewed  HEMOGLOBIN A1C - Abnormal; Notable for the following components:      Result Value   Hgb A1c MFr Bld 6.5 (*)    All other components within normal limits  BASIC METABOLIC PANEL - Abnormal; Notable for the following components:   Glucose, Bld 101 (*)    BUN 38 (*)    All other components within normal limits  CBC - Abnormal; Notable for the following components:   RBC 4.18 (*)    Hemoglobin 12.4 (*)    All other components within normal limits  GLUCOSE, CAPILLARY - Abnormal; Notable for the following components:   Glucose-Capillary 101 (*)    All other components within normal limits  SURGICAL PCR SCREEN  TYPE AND SCREEN     IMAGES: VAS US Renal Artery Duplex 02/19/2021 Summary:  Renal:     Right: Evidence of a > 60% stenosis of the right renal artery.         Abnormal right Resistive Index. RRV flow present. Cyst(s)  noted. Normal size right kidney.  Left:  Evidence of a > 60% stenosis in the left renal artery.         Abnormal left Resisitve Index. LRV flow present. Cyst(s)         noted. Normal size of left kidney.  Mesenteric:  70 to 99% stenosis in the superior mesenteric artery.   EKG: 08/31/2021 Rate 58 bpm  Sinus bradycardia  RBBB LAFB Septal infarct, age undetermined  CV: Echo 07/12/2021 1. There is no evidence of systolic anterior motion of the mitral valve  and there is no dynamic LV outflow tract obstruction at rest or with the  Valsalva maneuver. Left ventricular ejection fraction, by estimation, is  65 to 70%. Left ventricular  ejection fraction by 3D volume is 65 %. The left ventricle has   hyperdynamic function. The left ventricle has no regional wall motion  abnormalities. There is severe concentric left ventricular hypertrophy.  Left ventricular diastolic parameters are  consistent with Grade II diastolic dysfunction (pseudonormalization).  Elevated left atrial pressure.   2. Right ventricular systolic function is normal. The right ventricular  size is mildly enlarged. Mildly increased right ventricular wall  thickness. There is severely elevated pulmonary artery systolic pressure.  The estimated right ventricular systolic  pressure is [id number] mmHg.   3. Left atrial size was severely dilated.   4. Right atrial size was moderately dilated.   5. The pericardial effusion is circumferential.   6. The mitral valve is degenerative. Moderate mitral valve regurgitation.  No evidence of mitral stenosis. The mean mitral valve gradient is 3.0 mmHg  with average heart rate of 77 bpm.   7. Tricuspid valve regurgitation is mild to moderate.   8. The aortic valve is tricuspid. There is mild calcification of the  aortic valve. There is moderate thickening of the aortic valve. Aortic  valve regurgitation is not visualized. Aortic valve  sclerosis/calcification is present, without any evidence of  aortic stenosis.   9. The inferior vena cava is normal in size with <50% respiratory  variability, suggesting right atrial pressure of 8 mmHg.  Past Medical History:  Diagnosis Date   DJD (degenerative joint disease), lumbosacral    Hyperlipidemia    On statin   Hypertension    Resistant; on 4 medications.;  Doppler evidence of RAS-but does not meet criteria for RA revascularization by CORAL Trial   Osteoarthritis of right hip    Bone-on-bone pain more severe pain (Dr. Edmonia Lynch)   PAD (peripheral artery disease) ([place]) 03/14/2021   ABIs to evaluate right foot pain: R ABI 0.67, TBI 0.34.  L ABI 1.12, TBI 0.52.  Indicates moderate RLE arterial disease.  No significant L LE disease.   RAS  (renal artery stenosis) ([place]) 03/2021   Seen by Dr. Gwenlyn Saran from vascular surgery for bilateral RAS and SMA stenosis given poorly controlled hypertension.  Recommended to continue medical management as he did not meet criteria based on CORAL Trial.   Type 2 diabetes mellitus without complication ([place])    On metformin only.    Past Surgical History:  Procedure Laterality Date   INGUINAL HERNIA REPAIR Bilateral 08/10/2012   Procedure: HERNIA REPAIR INGUINAL ADULT;  Surgeon: Scherry Ran, MD;  Location: [facility];  Service: General;  Laterality: Bilateral;   RIGHT HEART CATH N/A 08/01/2021   Procedure: RIGHT HEART CATH;  Surgeon: Leonie Man, MD;  Location: [facility];  Service: Cardiovascular;  Laterality: N/A;    MEDICATIONS:  acetaminophen (TYLENOL) 500 MG tablet   atenolol (TENORMIN) 100 MG tablet   chlorthalidone (HYGROTON) 25 MG tablet   ferrous sulfate 325 (65 FE) MG tablet   furosemide (LASIX) 20 MG tablet   hydrALAZINE (APRESOLINE) 100 MG tablet   hydrALAZINE (APRESOLINE) 50 MG tablet   HYDROcodone-acetaminophen (NORCO) 10-325 MG tablet   lisinopril (ZESTRIL) 40 MG tablet   metFORMIN (GLUCOPHAGE) 1000 MG tablet   ondansetron (ZOFRAN) 8 MG tablet   simvastatin (ZOCOR) 40 MG tablet   verapamil (VERELAN PM) 360 MG 24 hr capsule   vitamin B-12 (CYANOCOBALAMIN) 1000 MCG tablet    sodium chloride flush (NS) 0.9 % injection 3 mL    [facility] Ward, PA-C [facility] Pre-Surgical Testing [phone]

## 2021-10-02 ENCOUNTER — Encounter (HOSPITAL_COMMUNITY)
Admission: RE | Disposition: A | Discharge status: Home / Self Care | Payer: Self-pay | Source: Ambulatory Visit | Attending: Orthopedic Surgery

## 2021-10-02 ENCOUNTER — Ambulatory Visit (HOSPITAL_COMMUNITY)
Admission: RE | Admit: 2021-10-02 | Discharge: 2021-10-02 | Disposition: A | Discharge status: Home / Self Care | Payer: Medicare Other | Source: Ambulatory Visit | Attending: Orthopedic Surgery | Admitting: Orthopedic Surgery

## 2021-10-02 ENCOUNTER — Ambulatory Visit (HOSPITAL_COMMUNITY): Payer: Medicare Other | Admitting: Physician Assistant

## 2021-10-02 ENCOUNTER — Ambulatory Visit (HOSPITAL_COMMUNITY): Payer: Medicare Other

## 2021-10-02 ENCOUNTER — Ambulatory Visit (HOSPITAL_BASED_OUTPATIENT_CLINIC_OR_DEPARTMENT_OTHER): Payer: Medicare Other | Admitting: Anesthesiology

## 2021-10-02 ENCOUNTER — Other Ambulatory Visit: Payer: Self-pay

## 2021-10-02 ENCOUNTER — Encounter (HOSPITAL_COMMUNITY): Payer: Self-pay | Admitting: Orthopedic Surgery

## 2021-10-02 DIAGNOSIS — E1151 Type 2 diabetes mellitus with diabetic peripheral angiopathy without gangrene: Secondary | ICD-10-CM | POA: Insufficient documentation

## 2021-10-02 DIAGNOSIS — M1611 Unilateral primary osteoarthritis, right hip: Secondary | ICD-10-CM | POA: Insufficient documentation

## 2021-10-02 DIAGNOSIS — Z87891 Personal history of nicotine dependence: Secondary | ICD-10-CM | POA: Diagnosis not present

## 2021-10-02 DIAGNOSIS — I10 Essential (primary) hypertension: Secondary | ICD-10-CM | POA: Insufficient documentation

## 2021-10-02 DIAGNOSIS — Z96641 Presence of right artificial hip joint: Secondary | ICD-10-CM

## 2021-10-02 DIAGNOSIS — I272 Pulmonary hypertension, unspecified: Secondary | ICD-10-CM | POA: Diagnosis not present

## 2021-10-02 DIAGNOSIS — Z7984 Long term (current) use of oral hypoglycemic drugs: Secondary | ICD-10-CM | POA: Diagnosis not present

## 2021-10-02 DIAGNOSIS — Z01818 Encounter for other preprocedural examination: Secondary | ICD-10-CM

## 2021-10-02 HISTORY — PX: TOTAL HIP ARTHROPLASTY: SHX124

## 2021-10-02 LAB — GLUCOSE, CAPILLARY
Glucose-Capillary: 145 mg/dL — ABNORMAL HIGH (ref 70–99)
Glucose-Capillary: 163 mg/dL — ABNORMAL HIGH (ref 70–99)

## 2021-10-02 LAB — TYPE AND SCREEN
ABO/RH(D): O POS
Antibody Screen: NEGATIVE

## 2021-10-02 LAB — ABO/RH: ABO/RH(D): O POS

## 2021-10-02 SURGERY — ARTHROPLASTY, HIP, TOTAL, ANTERIOR APPROACH
Anesthesia: General | Site: Hip | Laterality: Right

## 2021-10-02 MED ORDER — MIDAZOLAM HCL 2 MG/2ML IJ SOLN
INTRAMUSCULAR | Status: AC
  Filled 2021-10-02: qty 2

## 2021-10-02 MED ORDER — PROPOFOL 10 MG/ML IV BOLUS
INTRAVENOUS | Status: AC
  Filled 2021-10-02: qty 20

## 2021-10-02 MED ORDER — METOCLOPRAMIDE HCL 5 MG PO TABS
5.0000 mg | ORAL_TABLET | Freq: Three times a day (TID) | ORAL | Status: DC | PRN
  Filled 2021-10-02: qty 2

## 2021-10-02 MED ORDER — LIDOCAINE 2% (20 MG/ML) 5 ML SYRINGE
INTRAMUSCULAR | Status: DC | PRN
  Administered 2021-10-02: 1.5 mg/kg/h via INTRAVENOUS

## 2021-10-02 MED ORDER — EPHEDRINE 5 MG/ML INJ
INTRAVENOUS | Status: AC
  Filled 2021-10-02: qty 5

## 2021-10-02 MED ORDER — CEFAZOLIN SODIUM-DEXTROSE 1-4 GM/50ML-% IV SOLN: 1.0000 g | Freq: Four times a day (QID) | INTRAVENOUS | Status: DC

## 2021-10-02 MED ORDER — ONDANSETRON HCL 4 MG/2ML IJ SOLN
INTRAMUSCULAR | Status: DC | PRN
  Administered 2021-10-02: 4 mg via INTRAVENOUS

## 2021-10-02 MED ORDER — PHENYLEPHRINE HCL (PRESSORS) 10 MG/ML IV SOLN
INTRAVENOUS | Status: AC
  Filled 2021-10-02: qty 1

## 2021-10-02 MED ORDER — ONDANSETRON HCL 4 MG/2ML IJ SOLN: 4.0000 mg | Freq: Four times a day (QID) | INTRAMUSCULAR | Status: DC | PRN

## 2021-10-02 MED ORDER — LACTATED RINGERS IV BOLUS: 250.0000 mL | Freq: Once | INTRAVENOUS | Status: DC

## 2021-10-02 MED ORDER — POVIDONE-IODINE 10 % EX SWAB
2.0000 "application " | Freq: Once | CUTANEOUS | Status: AC
  Administered 2021-10-02: 2 via TOPICAL

## 2021-10-02 MED ORDER — OXYCODONE HCL 10 MG PO TABS: 5.0000 mg | ORAL_TABLET | Freq: Three times a day (TID) | ORAL | 0 refills | Status: DC | PRN

## 2021-10-02 MED ORDER — FENTANYL CITRATE PF 50 MCG/ML IJ SOSY
25.0000 ug | PREFILLED_SYRINGE | INTRAMUSCULAR | Status: DC | PRN
  Administered 2021-10-02: 25 ug via INTRAVENOUS

## 2021-10-02 MED ORDER — ACETAMINOPHEN 500 MG PO TABS: 1000.0000 mg | ORAL_TABLET | Freq: Four times a day (QID) | ORAL | Status: DC

## 2021-10-02 MED ORDER — METHOCARBAMOL 500 MG PO TABS: 500.0000 mg | ORAL_TABLET | Freq: Four times a day (QID) | ORAL | Status: DC | PRN

## 2021-10-02 MED ORDER — ONDANSETRON HCL 4 MG PO TABS
4.0000 mg | ORAL_TABLET | Freq: Four times a day (QID) | ORAL | Status: DC | PRN
  Filled 2021-10-02: qty 1

## 2021-10-02 MED ORDER — OXYCODONE HCL 5 MG PO TABS: 5.0000 mg | ORAL_TABLET | ORAL | Status: DC | PRN

## 2021-10-02 MED ORDER — LACTATED RINGERS IV BOLUS: 500.0000 mL | Freq: Once | INTRAVENOUS | Status: DC

## 2021-10-02 MED ORDER — OXYCODONE HCL 5 MG PO TABS: 10.0000 mg | ORAL_TABLET | ORAL | Status: DC | PRN

## 2021-10-02 MED ORDER — LIDOCAINE HCL (PF) 2 % IJ SOLN
INTRAMUSCULAR | Status: AC
  Filled 2021-10-02: qty 10

## 2021-10-02 MED ORDER — TRANEXAMIC ACID-NACL 1000-0.7 MG/100ML-% IV SOLN: 1000.0000 mg | Freq: Once | INTRAVENOUS | Status: DC

## 2021-10-02 MED ORDER — ALBUMIN HUMAN 5 % IV SOLN
INTRAVENOUS | Status: AC
  Filled 2021-10-02: qty 250

## 2021-10-02 MED ORDER — BUPIVACAINE LIPOSOME 1.3 % IJ SUSP
INTRAMUSCULAR | Status: AC
  Filled 2021-10-02: qty 10

## 2021-10-02 MED ORDER — DEXAMETHASONE SODIUM PHOSPHATE 10 MG/ML IJ SOLN
INTRAMUSCULAR | Status: AC
  Filled 2021-10-02: qty 1

## 2021-10-02 MED ORDER — SODIUM CHLORIDE FLUSH 0.9 % IV SOLN
INTRAVENOUS | Status: DC | PRN
  Administered 2021-10-02: 10 mL

## 2021-10-02 MED ORDER — ACETAMINOPHEN 325 MG PO TABS: 325.0000 mg | ORAL_TABLET | Freq: Four times a day (QID) | ORAL | Status: DC | PRN

## 2021-10-02 MED ORDER — LIDOCAINE 2% (20 MG/ML) 5 ML SYRINGE
INTRAMUSCULAR | Status: DC | PRN
  Administered 2021-10-02: 80 mg via INTRAVENOUS

## 2021-10-02 MED ORDER — EPHEDRINE SULFATE-NACL 50-0.9 MG/10ML-% IV SOSY
PREFILLED_SYRINGE | INTRAVENOUS | Status: DC | PRN
  Administered 2021-10-02 (×3): 5 mg via INTRAVENOUS

## 2021-10-02 MED ORDER — BUPIVACAINE LIPOSOME 1.3 % IJ SUSP
INTRAMUSCULAR | Status: DC | PRN
  Administered 2021-10-02: 10 mL

## 2021-10-02 MED ORDER — POVIDONE-IODINE 10 % EX SWAB: 2.0000 "application " | Freq: Once | CUTANEOUS | Status: DC

## 2021-10-02 MED ORDER — ROCURONIUM BROMIDE 10 MG/ML (PF) SYRINGE
PREFILLED_SYRINGE | INTRAVENOUS | Status: DC | PRN
  Administered 2021-10-02: 10 mg via INTRAVENOUS
  Administered 2021-10-02: 40 mg via INTRAVENOUS
  Administered 2021-10-02: 10 mg via INTRAVENOUS

## 2021-10-02 MED ORDER — DEXMEDETOMIDINE (PRECEDEX) IN NS 20 MCG/5ML (4 MCG/ML) IV SYRINGE
PREFILLED_SYRINGE | INTRAVENOUS | Status: DC | PRN
  Administered 2021-10-02 (×2): 4 ug via INTRAVENOUS

## 2021-10-02 MED ORDER — ORAL CARE MOUTH RINSE: 15.0000 mL | Freq: Once | OROMUCOSAL | Status: AC

## 2021-10-02 MED ORDER — RIVAROXABAN 10 MG PO TABS: 10.0000 mg | ORAL_TABLET | Freq: Every day | ORAL | 0 refills | Status: DC

## 2021-10-02 MED ORDER — SUGAMMADEX SODIUM 200 MG/2ML IV SOLN
INTRAVENOUS | Status: DC | PRN
  Administered 2021-10-02: 130 mg via INTRAVENOUS

## 2021-10-02 MED ORDER — MIDAZOLAM HCL 2 MG/2ML IJ SOLN
INTRAMUSCULAR | Status: DC | PRN
  Administered 2021-10-02: 1 mg via INTRAVENOUS

## 2021-10-02 MED ORDER — WATER FOR IRRIGATION, STERILE IR SOLN
Status: DC | PRN
  Administered 2021-10-02: 2000 mL

## 2021-10-02 MED ORDER — ACETAMINOPHEN 500 MG PO TABS: 1000.0000 mg | ORAL_TABLET | Freq: Four times a day (QID) | ORAL | 0 refills | Status: AC | PRN

## 2021-10-02 MED ORDER — ACETAMINOPHEN 10 MG/ML IV SOLN: 1000.0000 mg | Freq: Once | INTRAVENOUS | Status: DC | PRN

## 2021-10-02 MED ORDER — BUPIVACAINE LIPOSOME 1.3 % IJ SUSP: 10.0000 mL | Freq: Once | INTRAMUSCULAR | Status: DC

## 2021-10-02 MED ORDER — POLYETHYLENE GLYCOL 3350 17 G PO PACK: 17.0000 g | PACK | Freq: Every day | ORAL | 0 refills | Status: DC | PRN

## 2021-10-02 MED ORDER — HYDROMORPHONE HCL 1 MG/ML IJ SOLN: 0.5000 mg | INTRAMUSCULAR | Status: DC | PRN

## 2021-10-02 MED ORDER — PROPOFOL 1000 MG/100ML IV EMUL
INTRAVENOUS | Status: AC
  Filled 2021-10-02: qty 100

## 2021-10-02 MED ORDER — FENTANYL CITRATE PF 50 MCG/ML IJ SOSY
PREFILLED_SYRINGE | INTRAMUSCULAR | Status: AC
  Administered 2021-10-02: 25 ug via INTRAVENOUS
  Filled 2021-10-02: qty 2

## 2021-10-02 MED ORDER — DEXAMETHASONE SODIUM PHOSPHATE 10 MG/ML IJ SOLN
INTRAMUSCULAR | Status: DC | PRN
  Administered 2021-10-02: 5 mg via INTRAVENOUS

## 2021-10-02 MED ORDER — PHENYLEPHRINE HCL-NACL 20-0.9 MG/250ML-% IV SOLN
INTRAVENOUS | Status: DC | PRN
  Administered 2021-10-02: 20 ug/min via INTRAVENOUS

## 2021-10-02 MED ORDER — PROPOFOL 10 MG/ML IV BOLUS
INTRAVENOUS | Status: DC | PRN
  Administered 2021-10-02: 60 mg via INTRAVENOUS
  Administered 2021-10-02: 10 mg via INTRAVENOUS

## 2021-10-02 MED ORDER — METHOCARBAMOL 750 MG PO TABS: 750.0000 mg | ORAL_TABLET | Freq: Three times a day (TID) | ORAL | 0 refills | Status: DC | PRN

## 2021-10-02 MED ORDER — ALBUMIN HUMAN 5 % IV SOLN: INTRAVENOUS | Status: DC | PRN

## 2021-10-02 MED ORDER — LACTATED RINGERS IV SOLN: INTRAVENOUS | Status: DC

## 2021-10-02 MED ORDER — DEXMEDETOMIDINE (PRECEDEX) IN NS 20 MCG/5ML (4 MCG/ML) IV SYRINGE
PREFILLED_SYRINGE | INTRAVENOUS | Status: AC
  Filled 2021-10-02: qty 5

## 2021-10-02 MED ORDER — TRANEXAMIC ACID-NACL 1000-0.7 MG/100ML-% IV SOLN
1000.0000 mg | INTRAVENOUS | Status: AC
  Administered 2021-10-02: 1000 mg via INTRAVENOUS
  Filled 2021-10-02: qty 100

## 2021-10-02 MED ORDER — ACETAMINOPHEN 500 MG PO TABS
1000.0000 mg | ORAL_TABLET | Freq: Once | ORAL | Status: AC
  Administered 2021-10-02: 1000 mg via ORAL
  Filled 2021-10-02: qty 2

## 2021-10-02 MED ORDER — FENTANYL CITRATE (PF) 100 MCG/2ML IJ SOLN
INTRAMUSCULAR | Status: DC | PRN
  Administered 2021-10-02 (×2): 50 ug via INTRAVENOUS

## 2021-10-02 MED ORDER — FENTANYL CITRATE (PF) 100 MCG/2ML IJ SOLN
INTRAMUSCULAR | Status: AC
  Filled 2021-10-02: qty 2

## 2021-10-02 MED ORDER — SODIUM CHLORIDE (PF) 0.9 % IJ SOLN
INTRAMUSCULAR | Status: AC
  Filled 2021-10-02: qty 10

## 2021-10-02 MED ORDER — 0.9 % SODIUM CHLORIDE (POUR BTL) OPTIME
TOPICAL | Status: DC | PRN
  Administered 2021-10-02: 1000 mL

## 2021-10-02 MED ORDER — METOCLOPRAMIDE HCL 5 MG/ML IJ SOLN: 5.0000 mg | Freq: Three times a day (TID) | INTRAMUSCULAR | Status: DC | PRN

## 2021-10-02 MED ORDER — CHLORHEXIDINE GLUCONATE 0.12 % MT SOLN
15.0000 mL | Freq: Once | OROMUCOSAL | Status: AC
  Administered 2021-10-02: 15 mL via OROMUCOSAL

## 2021-10-02 MED ORDER — CEFAZOLIN SODIUM-DEXTROSE 2-4 GM/100ML-% IV SOLN
2.0000 g | INTRAVENOUS | Status: AC
  Administered 2021-10-02: 2 g via INTRAVENOUS
  Filled 2021-10-02: qty 100

## 2021-10-02 MED ORDER — ACETAMINOPHEN 10 MG/ML IV SOLN
INTRAVENOUS | Status: AC
  Filled 2021-10-02: qty 100

## 2021-10-02 MED ORDER — ROCURONIUM BROMIDE 10 MG/ML (PF) SYRINGE
PREFILLED_SYRINGE | INTRAVENOUS | Status: AC
  Filled 2021-10-02: qty 10

## 2021-10-02 MED ORDER — DEXAMETHASONE SODIUM PHOSPHATE 10 MG/ML IJ SOLN: 8.0000 mg | Freq: Once | INTRAMUSCULAR | Status: DC

## 2021-10-02 MED ORDER — TRAMADOL HCL 50 MG PO TABS: 50.0000 mg | ORAL_TABLET | Freq: Four times a day (QID) | ORAL | Status: DC

## 2021-10-02 MED ORDER — ONDANSETRON HCL 4 MG/2ML IJ SOLN
INTRAMUSCULAR | Status: AC
  Filled 2021-10-02: qty 2

## 2021-10-02 MED ORDER — ONDANSETRON HCL 4 MG/2ML IJ SOLN: 4.0000 mg | Freq: Once | INTRAMUSCULAR | Status: DC | PRN

## 2021-10-02 MED ORDER — METHOCARBAMOL 500 MG IVPB - SIMPLE MED: 500.0000 mg | Freq: Four times a day (QID) | INTRAVENOUS | Status: DC | PRN

## 2021-10-02 SURGICAL SUPPLY — 43 items
BLADE SAG 18X100X1.27 (BLADE) ×2 IMPLANT
BLADE SURG SZ10 CARB STEEL (BLADE) ×2 IMPLANT
CHLORAPREP W/TINT 26 (MISCELLANEOUS) ×2 IMPLANT
CLSR STERI-STRIP ANTIMIC 1/2X4 (GAUZE/BANDAGES/DRESSINGS) ×2 IMPLANT
COVER PERINEAL POST (MISCELLANEOUS) ×2 IMPLANT
COVER SURGICAL LIGHT HANDLE (MISCELLANEOUS) ×2 IMPLANT
DRAPE IMP U-DRAPE 54X76 (DRAPES) ×2 IMPLANT
DRAPE STERI IOBAN 125X83 (DRAPES) ×2 IMPLANT
DRAPE U-SHAPE 47X51 STRL (DRAPES) ×4 IMPLANT
DRSG MEPILEX BORDER 4X8 (GAUZE/BANDAGES/DRESSINGS) ×2 IMPLANT
ELECT REM PT RETURN 15FT ADLT (MISCELLANEOUS) ×2 IMPLANT
GLOVE BIO SURGEON STRL SZ7.5 (GLOVE) ×2 IMPLANT
GLOVE BIOGEL PI IND STRL 7.5 (GLOVE) ×1 IMPLANT
GLOVE BIOGEL PI IND STRL 8 (GLOVE) ×1 IMPLANT
GLOVE BIOGEL PI INDICATOR 7.5 (GLOVE) ×1
GLOVE BIOGEL PI INDICATOR 8 (GLOVE) ×1
GLOVE SURG SYN 7.5  E (GLOVE) ×2
GLOVE SURG SYN 7.5 E (GLOVE) ×1 IMPLANT
GLOVE SURG SYN 7.5 PF PI (GLOVE) ×1 IMPLANT
GOWN SPEC L4 XLG W/TWL (GOWN DISPOSABLE) ×2 IMPLANT
GOWN STRL REUS W/ TWL LRG LVL3 (GOWN DISPOSABLE) ×1 IMPLANT
GOWN STRL REUS W/TWL LRG LVL3 (GOWN DISPOSABLE) ×2
HEAD BIOLOX HIP 36/-5 (Joint) IMPLANT
HIP BIOLOX HD 36/-5 (Joint) ×2 IMPLANT
HOLDER FOLEY CATH W/STRAP (MISCELLANEOUS) ×1 IMPLANT
INSERT TRIDENT POLY 36 0DEG (Insert) ×1 IMPLANT
KIT TURNOVER KIT A (KITS) ×1 IMPLANT
MANIFOLD NEPTUNE II (INSTRUMENTS) ×2 IMPLANT
NS IRRIG 1000ML POUR BTL (IV SOLUTION) ×2 IMPLANT
PACK ANTERIOR HIP CUSTOM (KITS) ×2 IMPLANT
PROTECTOR NERVE ULNAR (MISCELLANEOUS) ×1 IMPLANT
SCREW HEX LP 6.5X20 (Screw) ×1 IMPLANT
SHELL ACETABUL CLUSTER SZ 54 (Shell) ×1 IMPLANT
SPIKE FLUID TRANSFER (MISCELLANEOUS) ×2 IMPLANT
STEM HIP 127 DEG (Stem) ×1 IMPLANT
SUT MNCRL AB 3-0 PS2 18 (SUTURE) ×2 IMPLANT
SUT VIC AB 0 CT1 36 (SUTURE) ×2 IMPLANT
SUT VIC AB 1 CT1 36 (SUTURE) ×2 IMPLANT
SUT VIC AB 2-0 CT1 27 (SUTURE) ×4
SUT VIC AB 2-0 CT1 TAPERPNT 27 (SUTURE) ×2 IMPLANT
TRAY FOLEY MTR SLVR 16FR STAT (SET/KITS/TRAYS/PACK) IMPLANT
TUBE SUCTION HIGH CAP CLEAR NV (SUCTIONS) ×2 IMPLANT
WATER STERILE IRR 1000ML POUR (IV SOLUTION) ×4 IMPLANT

## 2021-10-02 NOTE — Discharge Instructions (Addendum)

## 2021-10-02 NOTE — Anesthesia Procedure Notes (Signed)
Procedure Name: Intubation Date/Time: 10/02/2021 9:27 AM Performed by: Sharlette Dense, CRNA Pre-anesthesia Checklist: Patient identified, Emergency Drugs available, Suction available and Patient being monitored Patient Re-evaluated:Patient Re-evaluated prior to induction Oxygen Delivery Method: Circle system utilized Preoxygenation: Pre-oxygenation with 100% oxygen Induction Type: IV induction Ventilation: Mask ventilation without difficulty Laryngoscope Size: Miller and 3 Grade View: Grade I Tube type: Oral Tube size: 7.5 mm Number of attempts: 1 Airway Equipment and Method: Stylet Placement Confirmation: ETT inserted through vocal cords under direct vision, positive ETCO2 and breath sounds checked- equal and bilateral Secured at: 21 cm Tube secured with: Tape Dental Injury: Teeth and Oropharynx as per pre-operative assessment

## 2021-10-02 NOTE — Interval H&P Note (Signed)
History and Physical Interval Note:  10/02/2021 8:29 AM  Mark Cross  has presented today for surgery, with the diagnosis of OA RIGHT HIP.  The various methods of treatment have been discussed with the patient and family. After consideration of risks, benefits and other options for treatment, the patient has consented to  Procedure(s): TOTAL HIP ARTHROPLASTY ANTERIOR APPROACH (Right) as a surgical intervention.  The patient's history has been reviewed, patient examined, no change in status, stable for surgery.  I have reviewed the patient's chart and labs.  Questions were answered to the patient's satisfaction.     Sheral Apley

## 2021-10-02 NOTE — Evaluation (Signed)
Physical Therapy Evaluation Patient Details Name: Mark Cross MRN: [id number] DOB: 08-28-1940 Today's Date: 10/02/2021  History of Present Illness  Pt is a 81yo male presenting s/p R-THA, AA on 10/02/21 PMH: HLD, HTN, OA, PAD, DM.  Clinical Impression  Mark Cross is a 81 y.o. male POD 0 s/p R-THA, AA. Patient reports modified independence using SPC for community mobility and RW for household mobility at baseline. Patient is now limited by functional impairments (see PT problem list below) and requires min guard for transfers and gait with RW. Patient was able to ambulate 60 feet with RW and min guard and cues for safe walker management. Patient educated on safe sequencing for stair mobility and verbalized safe guarding position for people assisting with mobility. Patient instructed in exercises to facilitate ROM and circulation. Patient will benefit from continued skilled PT interventions to address impairments and progress towards PLOF. Patient has met mobility goals at adequate level for discharge home; will continue to follow if pt continues acute stay to progress towards Mod I goals.        Recommendations for follow up therapy are one component of a multi-disciplinary discharge planning process, led by the attending physician.  Recommendations may be updated based on patient status, additional functional criteria and insurance authorization.  Follow Up Recommendations Follow physician's recommendations for discharge plan and follow up therapies    Assistance Recommended at Discharge Set up Supervision/Assistance  Patient can return home with the following  A little help with walking and/or transfers;A little help with bathing/dressing/bathroom;Assistance with cooking/housework;Assist for transportation;Help with stairs or ramp for entrance    Equipment Recommendations None recommended by PT  Recommendations for Other Services       Functional Status Assessment Patient has had a  recent decline in their functional status and demonstrates the ability to make significant improvements in function in a reasonable and predictable amount of time.     Precautions / Restrictions Precautions Precautions: None Restrictions Weight Bearing Restrictions: No Other Position/Activity Restrictions: WBAT      Mobility  Bed Mobility Overal bed mobility: Needs Assistance Bed Mobility: Supine to Sit     Supine to sit: Min guard     General bed mobility comments: For safety only, no physical assist required.    Transfers Overall transfer level: Needs assistance Equipment used: Rolling walker (2 wheels) Transfers: Sit to/from Stand Sit to Stand: Min guard           General transfer comment: For safety only, no physical assist required.    Ambulation/Gait Ambulation/Gait assistance: Min guard Gait Distance (Feet): 60 Feet Assistive device: Rolling walker (2 wheels) Gait Pattern/deviations: Step-to pattern Gait velocity: decreased     General Gait Details: Pt ambulated with RW and min guard assist, no physical assist required or overt LOB noted.  Stairs Stairs: Yes Stairs assistance: Min guard Stair Management: Two rails, Step to pattern, Forwards Number of Stairs: 2 General stair comments: Pt educated on safe stair mobilization and sequencing, verbalized understanding. Pt demonstrated safe technique with VCs for technique and min guard for safety only, no physical assist required.  Wheelchair Mobility    Modified Rankin (Stroke Patients Only)       Balance Overall balance assessment: Needs assistance Sitting-balance support: Feet supported, No upper extremity supported Sitting balance-Leahy Scale: Good     Standing balance support: Bilateral upper extremity supported, During functional activity, Reliant on assistive device for balance Standing balance-Leahy Scale: Poor  Pertinent Vitals/Pain Pain  Assessment Pain Assessment: 0-10 Pain Score: 2  Pain Location: r hip Pain Descriptors / Indicators: Operative site guarding Pain Intervention(s): Limited activity within patient's tolerance, Monitored during session, Repositioned    Home Living Family/patient expects to be discharged to:: Private residence Living Arrangements: Spouse/significant other Available Help at Discharge: Family;Available 24 hours/day Type of Home: House Home Access: Ramped entrance       Home Layout: One level Home Equipment: Shower seat;Grab bars - tub/shower;Rolling Walker (2 wheels);Rollator (4 wheels);BSC/3in1;Toilet riser      Prior Function Prior Level of Function : Independent/Modified Independent             Mobility Comments: Uses SPC at the store, uses RW at the house. ADLs Comments: Assistance with bathing and dressing, wife cooks     Hand Dominance   Dominant Hand: Right    Extremity/Trunk Assessment   Upper Extremity Assessment Upper Extremity Assessment: Overall WFL for tasks assessed    Lower Extremity Assessment Lower Extremity Assessment: RLE deficits/detail;LLE deficits/detail RLE Deficits / Details: MMT ank PF/DF 5/5 RLE Sensation: WNL LLE Deficits / Details: MMT ank PF/DF 5/5 LLE Sensation: WNL    Cervical / Trunk Assessment Cervical / Trunk Assessment: Normal  Communication   Communication: No difficulties  Cognition Arousal/Alertness: Awake/alert Behavior During Therapy: WFL for tasks assessed/performed Overall Cognitive Status: Within Functional Limits for tasks assessed                                          General Comments      Exercises Total Joint Exercises Ankle Circles/Pumps: AROM, Both, 10 reps Quad Sets: AROM, Both, Other reps (comment) (2) Short Arc Quad: AROM, Right, Other reps (comment) (3) Heel Slides: AROM, Right, Other reps (comment) (2) Hip ABduction/ADduction: AROM, Right, Other reps (comment) (2)    Assessment/Plan    PT Assessment Patient needs continued PT services  PT Problem List Decreased strength;Decreased range of motion;Decreased balance;Decreased activity tolerance;Decreased mobility;Decreased coordination;Pain       PT Treatment Interventions DME instruction;Gait training;Stair training;Functional mobility training;Therapeutic activities;Therapeutic exercise;Balance training;Neuromuscular re-education;Patient/family education    PT Goals (Current goals can be found in the Care Plan section)  Acute Rehab PT Goals 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 PT Goal Formulation: With patient Time For Goal Achievement: 10/09/21 Potential to Achieve Goals: Good    Frequency 7X/week     Co-evaluation               AM-PAC PT "6 Clicks" Mobility  Outcome Measure Help needed turning from your back to your side while in a flat bed without using bedrails?: None Help needed moving from lying on your back to sitting on the side of a flat bed without using bedrails?: A Little Help needed moving to and from a bed to a chair (including a wheelchair)?: A Little Help needed standing up from a chair using your arms (e.g., wheelchair or bedside chair)?: A Little Help needed to walk in hospital room?: A Little Help needed climbing 3-5 steps with a railing? : A Little 6 Click Score: 19    End of Session Equipment Utilized During Treatment: Gait belt Activity Tolerance: Patient tolerated treatment well;No increased pain Patient left: in chair;with call bell/phone within reach Nurse Communication: Mobility status PT Visit Diagnosis: Difficulty in walking, not elsewhere classified (R26.2)    Time: 7017-7939 PT Time  Calculation (min) (ACUTE ONLY): 30 min   Charges:   PT Evaluation $PT Eval Low Complexity: 1 Low PT Treatments $Gait Training: 8-22 mins        Coolidge Breeze, PT, DPT [facility] Office: [phone] Pager:  [phone]   Lily Kernen 10/02/2021, 1:50 PM

## 2021-10-02 NOTE — Anesthesia Procedure Notes (Signed)
Arterial Line Insertion Start/End5/30/2023 9:05 AM, 10/02/2021 9:10 AM Performed by: Florene Route, CRNA, CRNA  Patient location: Pre-op. Preanesthetic checklist: patient identified, IV checked, site marked, risks and benefits discussed, surgical consent, monitors and equipment checked, pre-op evaluation, timeout performed and anesthesia consent Lidocaine 1% used for infiltration radial was placed Catheter size: 20 G Hand hygiene performed  and maximum sterile barriers used   Attempts: 1 Procedure performed without using ultrasound guided technique. Following insertion, dressing applied. Post procedure assessment: normal and unchanged  Patient tolerated the procedure well with no immediate complications.

## 2021-10-02 NOTE — Op Note (Signed)
10/02/2021  10:38 AM  PATIENT:  Mark Cross   MRN: [id number]  PRE-OPERATIVE DIAGNOSIS:  OA RIGHT HIP  POST-OPERATIVE DIAGNOSIS:  OA RIGHT HIP  PROCEDURE:  Procedure(s): TOTAL HIP ARTHROPLASTY ANTERIOR APPROACH  PREOPERATIVE INDICATIONS:    MARQUETT BERTOLI is an 81 y.o. male who has a diagnosis of <principal problem not specified> and elected for surgical management after failing conservative treatment.  The risks benefits and alternatives were discussed with the patient including but not limited to the risks of nonoperative treatment, versus surgical intervention including infection, bleeding, nerve injury, periprosthetic fracture, the need for revision surgery, dislocation, leg length discrepancy, blood clots, cardiopulmonary complications, morbidity, mortality, among others, and they were willing to proceed.     OPERATIVE REPORT     SURGEON:   Renette Butters, MD    ASSISTANT:  Aggie Moats, PA-C, he was present and scrubbed throughout the case, critical for completion in a timely fashion, and for retraction, instrumentation, and closure.     ANESTHESIA:  General    COMPLICATIONS:  None.     COMPONENTS:  Stryker acolade fit femur size 5 with a 36 mm -5 head ball and an acetabular shell size 54 with a  polyethylene liner    PROCEDURE IN DETAIL:   The patient was met in the holding area and  identified.  The appropriate hip was identified and marked at the operative site.  The patient was then transported to the OR  and  placed under anesthesia per that record.  At that point, the patient was  placed in the supine position and  secured to the operating room table and all bony prominences padded. He received pre-operative antibiotics    The operative lower extremity was prepped from the iliac crest to the distal leg.  Sterile draping was performed.  Time out was performed prior to incision.      Skin incision was made just 2 cm lateral to the ASIS  extending in line with the  tensor fascia lata. Electrocautery was used to control all bleeders. I dissected down sharply to the fascia of the tensor fascia lata was confirmed that the muscle fibers beneath were running posteriorly. I then incised the fascia over the superficial tensor fascia lata in line with the incision. The fascia was elevated off the anterior aspect of the muscle the muscle was retracted posteriorly and protected throughout the case. I then used electrocautery to incise the tensor fascia lata fascia control and all bleeders. Immediately visible was the fat over top of the anterior neck and capsule.  I removed the anterior fat from the capsule and elevated the rectus muscle off of the anterior capsule. I then removed a large time of capsule. The retractors were then placed over the anterior acetabulum as well as around the superior and inferior neck.  I then made a femoral neck cut. Then used the power corkscrew to remove the femoral head from the acetabulum and thoroughly irrigated the acetabulum. I sized the femoral head.    I then exposed the deep acetabulum, cleared out any tissue including the ligamentum teres.   After adequate visualization, I excised the labrum, and then sequentially reamed.  I then impacted the acetabular implant into place using fluoroscopy for guidance.  Appropriate version and inclination was confirmed clinically matching their bony anatomy, and with fluoroscopy.  I placed a 20 mm screw in the posterior/superio position with an excellent bite.    I then placed the polyethylene liner in  place  I then adducted the leg and released the external rotators from the posterior femur allowing it to be easily delivered up lateral and anterior to the acetabulum for preparation of the femoral canal.    I then prepared the proximal femur using the cookie-cutter and then sequentially reamed and broached.  A trial broach, neck, and head was utilized, and I reduced the hip and used floroscopy to  assess the neck length and femoral implant.  I then impacted the femoral prosthesis into place into the appropriate version. The hip was then reduced and fluoroscopy confirmed appropriate position. Leg lengths were restored.  I then irrigated the hip copiously again with, and repaired the fascia with Vicryl, followed by monocryl for the subcutaneous tissue, Monocryl for the skin, Steri-Strips and sterile gauze. The patient was then awakened and returned to PACU in stable and satisfactory condition. There were no complications.  POST OPERATIVE PLAN: WBAT, DVT px: SCD's/TED, ambulation and chemical dvt px  Zailey Audia, MD Orthopedic Surgeon [phone]     

## 2021-10-02 NOTE — Transfer of Care (Signed)
Immediate Anesthesia Transfer of Care Note  Patient: Mark Cross  Procedure(s) Performed: TOTAL HIP ARTHROPLASTY ANTERIOR APPROACH (Right: Hip)  Patient Location: PACU  Anesthesia Type:General  Level of Consciousness: awake and alert   Airway & Oxygen Therapy: Patient Spontanous Breathing and Patient connected to face mask oxygen  Post-op Assessment: Report given to RN and Post -op Vital signs reviewed and stable  Post vital signs: Reviewed and stable  Last Vitals:  Vitals Value Taken Time  BP 166/63 10/02/21 1121  Temp    Pulse 65 10/02/21 1124  Resp 17 10/02/21 1124  SpO2 100 % 10/02/21 1124  Vitals shown include unvalidated device data.  Last Pain:  Vitals:   10/02/21 0708  TempSrc: Oral         Complications: No notable events documented.

## 2021-10-02 NOTE — Anesthesia Postprocedure Evaluation (Signed)
Anesthesia Post Note  Patient: Mark Cross  Procedure(s) Performed: TOTAL HIP ARTHROPLASTY ANTERIOR APPROACH (Right: Hip)     Patient location during evaluation: PACU Anesthesia Type: General Level of consciousness: awake and alert Pain management: pain level controlled Vital Signs Assessment: post-procedure vital signs reviewed and stable Respiratory status: spontaneous breathing, nonlabored ventilation, respiratory function stable and patient connected to nasal cannula oxygen Cardiovascular status: blood pressure returned to baseline and stable Postop Assessment: no apparent nausea or vomiting Anesthetic complications: no   No notable events documented.  Last Vitals:  Vitals:   10/02/21 1215 10/02/21 1246  BP: (!) 144/58 (!) 154/89  Pulse: 62 68  Resp: 11 14  Temp: 36.6 C 36.7 C  SpO2: 93% 97%    Last Pain:  Vitals:   10/02/21 1246  TempSrc:   PainSc: 0-No pain                 Barnet [place]

## 2021-10-03 ENCOUNTER — Encounter (HOSPITAL_COMMUNITY): Payer: Self-pay | Admitting: Orthopedic Surgery

## 2021-10-03 ENCOUNTER — Ambulatory Visit (HOSPITAL_COMMUNITY): Payer: Medicare Other | Attending: Orthopedic Surgery | Admitting: Physical Therapy

## 2021-10-03 DIAGNOSIS — M25651 Stiffness of right hip, not elsewhere classified: Secondary | ICD-10-CM | POA: Insufficient documentation

## 2021-10-03 DIAGNOSIS — M6281 Muscle weakness (generalized): Secondary | ICD-10-CM | POA: Insufficient documentation

## 2021-10-03 DIAGNOSIS — M25551 Pain in right hip: Secondary | ICD-10-CM | POA: Diagnosis present

## 2021-10-03 DIAGNOSIS — R262 Difficulty in walking, not elsewhere classified: Secondary | ICD-10-CM | POA: Insufficient documentation

## 2021-10-03 NOTE — Therapy (Signed)
OUTPATIENT PHYSICAL THERAPY LOWER EXTREMITY EVALUATION   Patient Name: Mark Cross MRN: [id number] DOB:01/21/1941, 81 y.o., male Today's Date: 10/03/2021   PT End of Session - 10/03/21 1132     Visit Number 1    Number of Visits 12    Date for PT Re-Evaluation 11/14/21    Authorization Type [employer] medicare    Progress Note Due on Visit 10    PT Start Time 1130    PT Stop Time 1210    PT Time Calculation (min) 40 min    Activity Tolerance Patient tolerated treatment well;No increased pain    Behavior During Therapy WFL for tasks assessed/performed             Past Medical History:  Diagnosis Date   DJD (degenerative joint disease), lumbosacral    Hyperlipidemia    On statin   Hypertension    Resistant; on 4 medications.;  Doppler evidence of RAS-but does not meet criteria for RA revascularization by CORAL Trial   Osteoarthritis of right hip    Bone-on-bone pain more severe pain (Mark Cross)   PAD (peripheral artery disease) (HCC) 03/14/2021   ABIs to evaluate right foot pain: R ABI 0.67, TBI 0.34.  L ABI 1.12, TBI 0.52.  Indicates moderate RLE arterial disease.  No significant L LE disease.   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 Nolon Cross from vascular surgery for bilateral RAS and SMA stenosis given poorly controlled hypertension.  Recommended to continue medical management as he did not meet criteria based on CORAL Trial.   Type 2 diabetes mellitus without complication (HCC)    On metformin only.   Past Surgical History:  Procedure Laterality Date   INGUINAL HERNIA REPAIR Bilateral 08/10/2012   Procedure: HERNIA REPAIR INGUINAL ADULT;  Surgeon: Marlane Hatcher, MD;  Location: [facility];  Service: General;  Laterality: Bilateral;   RIGHT HEART CATH N/A 08/01/2021   Procedure: RIGHT HEART CATH;  Surgeon: Mark Lex, MD;  Location: [facility] INVASIVE CV [facility];  Service: Cardiovascular;  Laterality: N/A;   TOTAL HIP ARTHROPLASTY Right 10/02/2021    Procedure: TOTAL HIP ARTHROPLASTY ANTERIOR APPROACH;  Surgeon: Mark Apley, MD;  Location: [facility];  Service: Orthopedics;  Laterality: Right;   Patient Active Problem List   Diagnosis Date Noted   Pulmonary hypertension, unspecified (HCC) 08/01/2021   Hyperlipidemia associated with type 2 diabetes mellitus (HCC) 07/07/2021   Preop cardiovascular exam 07/06/2021   Bifascicular bundle branch block 07/06/2021   Resistant hypertension 07/06/2021   Systolic murmur 07/06/2021    PCP: Mark Cross   REFERRING PROVIDER: Sheral Apley, MD   REFERRING DIAG: PT eval/tx for post op THR (DOS 10/02/2021)   THERAPY DIAG:  Difficulty in walking, muscle weakness   Rationale for Evaluation and Treatment Rehabilitation  ONSET DATE: 10/02/2021  SUBJECTIVE:   SUBJECTIVE STATEMENT: Pt states that he was having pain for two years with gradually worsening course since that time. He had gotten to the point where he was walking with a walker. He opted to have a THR on 5/30.      PERTINENT HISTORY: DJD, Back pain, HTN, PAD, DM   PAIN:  Are you having pain? Yes: NPRS scale: 6/10, worst pain is at a 9/10 Pain location: Rt Pain description: Rt hip  Aggravating factors: activity  Relieving factors: ice, meds   PRECAUTIONS: Anterior hip  WEIGHT BEARING RESTRICTIONS Yes /wbat  FALLS:  Has patient fallen in last 6  months? No  LIVING ENVIRONMENT: Lives with: lives with their family Lives in: House/apartment Stairs: Yes: External: 3 steps; bilateral but cannot reach both Has following equipment at home: Single point cane and Walker - 2 wheeled  OCCUPATION: retired   PLOF: Independent, pt likes to go fishing, he would like to be able to have a garden next year as he normally has a garden but has not been able to to this for the past two years.    PATIENT GOALS To be able to walk without assistive device, To be able to move easier, to have no pain in his hip and to sleep better.      OBJECTIVE:   PATIENT SURVEYS:  FOTO 38  COGNITION:  Overall cognitive status: Within functional limits for tasks assessed      EDEMA:  Noted lateral swelling    LOWER EXTREMITY ROM:  Active ROM Right eval Left eval  Hip flexion 45   Hip extension    Hip abduction    Hip adduction    Hip internal rotation    Hip external rotation    Knee flexion    Knee extension -10   Ankle dorsiflexion    Ankle plantarflexion    Ankle inversion    Ankle eversion     (Blank rows = not tested)  LOWER EXTREMITY MMT:   MMT Right eval Left eval  Hip flexion 3-   Hip extension    Hip abduction    Hip adduction    Hip internal rotation    Hip external rotation    Knee flexion    Knee extension 3+   Ankle dorsiflexion 4   Ankle plantarflexion    Ankle inversion    Ankle eversion     (Blank rows = not tested) FUNCTIONAL TESTS:  30 seconds sit to stand: with UE assist  4x  2 minute walk test: PT ambulates with RW:  x 60 ft.      TODAY'S TREATMENT: 10/03/2021: Evaluation Sitting :  LAQ x 10 Supine:  Ankle pumps, quad set, glut set x 10; heel slides x 5 reps each.     PATIENT EDUCATION:  Education details: HEP [id number] Person educated: Patient Education method: Explanation, Verbal cues, and Handouts Education comprehension: verbalized understanding   HOME EXERCISE PROGRAM: Sitting :  LAQ x 10 Supine:  Ankle pumps, quad set, glut set, heel slides x 10 reps each.     ASSESSMENT:  CLINICAL IMPRESSION: Patient is a 81 y.o. male  who was seen today for physical therapy evaluation and treatment for s/p Rt THR on 10/02/2021.  Evaluation demonstrates decreased ROM, decreased mobility, decreased muscle strength, difficulty walking and increased pain.  Mr. Mark Cross will benefit from skilled PT to address these issues and maximize his functional ability.    OBJECTIVE IMPAIRMENTS Abnormal gait, decreased activity tolerance, decreased balance, decreased mobility, difficulty  walking, decreased ROM, decreased strength, increased edema, increased fascial restrictions, and pain.   ACTIVITY LIMITATIONS carrying, lifting, bending, sitting, standing, squatting, sleeping, stairs, bed mobility, bathing, toileting, dressing, reach over head, locomotion level, and caring for others  PARTICIPATION LIMITATIONS: cleaning, driving, shopping, community activity, and yard work  PERSONAL FACTORS Age are also affecting patient's functional outcome.   REHAB POTENTIAL: Good  CLINICAL DECISION MAKING: Stable/uncomplicated  EVALUATION COMPLEXITY: Moderate   GOALS: Goals reviewed with patient? No  SHORT TERM GOALS: Target date: 10/24/2021   PT to be I in HEP Baseline: Goal status: INITIAL  2.  PT to be ambulating  with a cane both in and outside  Baseline:  Goal status: INITIAL  3.  PT to be able to walk for 15 minutes without increased pain. Baseline:  Goal status: INITIAL           4.   Pt pain level in Rt hip to be no greater than a 4/10 Goal status: INITIAL  LONG TERM GOALS: Target date: 11/14/2021   PT to be I in advanced HEP to improve his strength of his Rt hip to at least 4+/5 to be able to walk for 30 minutes without assistive device or difficulty. Baseline:  Goal status: INITIAL  2.  PT to be able to ascend and descend 8 steps using a hand rail in a reciprocal mannner. Baseline:  Goal status: INITIAL  3.  PT to be able to come sit to stand without UE assist 5 x in less than 20 seconds  Baseline:  Goal status: INITIAL  4.  PT to be sleeping 6 hours at a time Baseline:  Goal status: INITIAL 5.  Pt hip ROM to be improved to 110 to allow pt to squat down to pick items out of his garden            Goal status : initial             6.  Pt pain level of Rt hip to be no greater than a 1/10             Goal status: initial    PLAN: PT FREQUENCY: 2x/week  PT DURATION: 6 weeks  PLANNED INTERVENTIONS: Therapeutic exercises, Therapeutic activity, Balance  training, Gait training, Patient/Family education, and Manual therapy  PLAN FOR NEXT SESSION: begin WB activity to include but not limited to heel raises, rocker board, functional squat, and sit to stand.Virgina Organ, PT CLT [phone]  10/03/2021, 11:42 AM

## 2021-10-05 ENCOUNTER — Ambulatory Visit (HOSPITAL_COMMUNITY): Payer: Medicare Other | Attending: Family Medicine

## 2021-10-05 ENCOUNTER — Encounter (HOSPITAL_COMMUNITY): Payer: Self-pay

## 2021-10-05 DIAGNOSIS — M25551 Pain in right hip: Secondary | ICD-10-CM | POA: Insufficient documentation

## 2021-10-05 DIAGNOSIS — M25651 Stiffness of right hip, not elsewhere classified: Secondary | ICD-10-CM | POA: Insufficient documentation

## 2021-10-05 DIAGNOSIS — R262 Difficulty in walking, not elsewhere classified: Secondary | ICD-10-CM | POA: Diagnosis present

## 2021-10-05 DIAGNOSIS — M6281 Muscle weakness (generalized): Secondary | ICD-10-CM | POA: Insufficient documentation

## 2021-10-05 DIAGNOSIS — Z96641 Presence of right artificial hip joint: Secondary | ICD-10-CM | POA: Diagnosis not present

## 2021-10-05 NOTE — Therapy (Signed)
OUTPATIENT PHYSICAL THERAPY LOWER EXTREMITY TREATMENT   Patient Name: Mark Cross MRN: [id number] DOB:04-Jul-1940, 81 y.o., male Today's Date: 10/05/2021   PT End of Session - 10/05/21 1043     Visit Number 2    Number of Visits 12    Date for PT Re-Evaluation 11/14/21    Authorization Type [employer] medicare    Progress Note Due on Visit 10    PT Start Time 1010    PT Stop Time 1055    PT Time Calculation (min) 45 min              Past Medical History:  Diagnosis Date   DJD (degenerative joint disease), lumbosacral    Hyperlipidemia    On statin   Hypertension    Resistant; on 4 medications.;  Doppler evidence of RAS-but does not meet criteria for RA revascularization by CORAL Trial   Osteoarthritis of right hip    Bone-on-bone pain more severe pain (Dr. Margarita Cross)   PAD (peripheral artery disease) (HCC) 03/14/2021   ABIs to evaluate right foot pain: R ABI 0.67, TBI 0.34.  L ABI 1.12, TBI 0.52.  Indicates moderate RLE arterial disease.  No significant L LE disease.   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 Nolon Cross from vascular surgery for bilateral RAS and SMA stenosis given poorly controlled hypertension.  Recommended to continue medical management as he did not meet criteria based on CORAL Trial.   Type 2 diabetes mellitus without complication (HCC)    On metformin only.   Past Surgical History:  Procedure Laterality Date   INGUINAL HERNIA REPAIR Bilateral 08/10/2012   Procedure: HERNIA REPAIR INGUINAL ADULT;  Surgeon: Marlane Hatcher, MD;  Location: [facility];  Service: General;  Laterality: Bilateral;   RIGHT HEART CATH N/A 08/01/2021   Procedure: RIGHT HEART CATH;  Surgeon: Marykay Lex, MD;  Location: [facility] INVASIVE CV LAB;  Service: Cardiovascular;  Laterality: N/A;   TOTAL HIP ARTHROPLASTY Right 10/02/2021   Procedure: TOTAL HIP ARTHROPLASTY ANTERIOR APPROACH;  Surgeon: Sheral Apley, MD;  Location: [facility];  Service: Orthopedics;   Laterality: Right;   Patient Active Problem List   Diagnosis Date Noted   Pulmonary hypertension, unspecified (HCC) 08/01/2021   Hyperlipidemia associated with type 2 diabetes mellitus (HCC) 07/07/2021   Preop cardiovascular exam 07/06/2021   Bifascicular bundle branch block 07/06/2021   Resistant hypertension 07/06/2021   Systolic murmur 07/06/2021    PCP: Mark Cross   REFERRING PROVIDER: Sheral Apley, MD   REFERRING DIAG: PT eval/tx for post op THR (DOS 10/02/2021)   THERAPY DIAG:  Difficulty in walking, muscle weakness   Rationale for Evaluation and Treatment Rehabilitation  ONSET DATE: 10/02/2021  SUBJECTIVE:   SUBJECTIVE STATEMENT: Pt states that he was having pain for two years with gradually worsening course since that time. He had gotten to the point where he was walking with a walker. He opted to have a THR on 5/30.  Pt stated he had increased pain 10/10 yesterday, pain reduced to 7/10 today.  Has began the HEP without questikns.        PERTINENT HISTORY: DJD, Back pain, HTN, PAD, DM   PAIN:  Are you having pain? Yes: NPRS scale: 7/10, worst pain is at a 10/10 Pain location: Rt hip Pain description: Sharp pain depending upon movement Aggravating factors: activity  Relieving factors: ice, meds   PRECAUTIONS: Anterior hip  WEIGHT BEARING RESTRICTIONS Yes Mark Cross  FALLS:  Has patient fallen in last 6 months? No  LIVING ENVIRONMENT: Lives with: lives with their family Lives in: House/apartment Stairs: Yes: External: 3 steps; bilateral but cannot reach both Has following equipment at home: Single point cane and Walker - 2 wheeled  OCCUPATION: retired   PLOF: Independent, pt likes to go fishing, he would like to be able to have a garden next year as he normally has a garden but has not been able to to this for the past two years.    PATIENT GOALS To be able to walk without assistive device, To be able to move easier, to have no pain in his hip and  to sleep better.     OBJECTIVE:   PATIENT SURVEYS:  FOTO 38  COGNITION:  Overall cognitive status: Within functional limits for tasks assessed      EDEMA:  Noted lateral swelling    LOWER EXTREMITY ROM:  Active ROM Right eval Left eval  Hip flexion 45   Hip extension    Hip abduction    Hip adduction    Hip internal rotation    Hip external rotation    Knee flexion    Knee extension -10   Ankle dorsiflexion    Ankle plantarflexion    Ankle inversion    Ankle eversion     (Blank rows = not tested)  LOWER EXTREMITY MMT:   MMT Right eval Left eval  Hip flexion 3-   Hip extension    Hip abduction    Hip adduction    Hip internal rotation    Hip external rotation    Knee flexion    Knee extension 3+   Ankle dorsiflexion 4   Ankle plantarflexion    Ankle inversion    Ankle eversion     (Blank rows = not tested) FUNCTIONAL TESTS:  30 seconds sit to stand: with UE assist  4x  2 minute walk test: PT ambulates with RW:  x 60 ft.      TODAY'S TREATMENT: 6//2/23: Reviewed goals, educated importance of HEP compliance for maximal benefits. Supine: quad sets 10x 5"  Heel slide 5x  Bridge with cueing to reduce ER 10x 5" Sitting: LAQ 10x  STS 5x with 1 HHA Standing:   Weight shifting lateral 20x  Heel raise 10x  Gait training, cueing for heel to toe mechanics, knee extension and equal stride length  10/03/2021: Evaluation Sitting :  LAQ x 10 Supine:  Ankle pumps, quad set, glut set x 10; heel slides x 5 reps each.     PATIENT EDUCATION:  Education details: HEP [id number] Person educated: Patient Education method: Explanation, Verbal cues, and Handouts Education comprehension: verbalized understanding   HOME EXERCISE PROGRAM: Sitting :  LAQ x 10 Supine:  Ankle pumps, quad set, glut set, heel slides x 10 reps each.     ASSESSMENT:  CLINICAL IMPRESSION: Reviewed goals, educated importance of HEP compliance for maximal benefits.  Reviewed current  exercise program, pt able to recall and demonstrate appropriate mechanics.  Pt ambulates with Bil knee flexion, educated importance of imporving knee extension with gait.  Pt limited by pain through session, monitoed with reports of exercises helping reduce pain.  Pt with tendency to ambulate flat footed, educated heel to toe mechanics and equal stride length to improve mechanics.  Added bridge and weight shifting to HEP with printout given, verbalized understanding.    OBJECTIVE IMPAIRMENTS Abnormal gait, decreased activity tolerance, decreased balance, decreased mobility, difficulty walking, decreased ROM, decreased strength,  increased edema, increased fascial restrictions, and pain.   ACTIVITY LIMITATIONS carrying, lifting, bending, sitting, standing, squatting, sleeping, stairs, bed mobility, bathing, toileting, dressing, reach over head, locomotion level, and caring for others  PARTICIPATION LIMITATIONS: cleaning, driving, shopping, community activity, and yard work  PERSONAL FACTORS Age are also affecting patient's functional outcome.   REHAB POTENTIAL: Good  CLINICAL DECISION MAKING: Stable/uncomplicated  EVALUATION COMPLEXITY: Moderate   GOALS: Goals reviewed with patient? No  SHORT TERM GOALS: Target date: 10/26/2021   PT to be I in HEP Baseline: Goal status: Ongoing  2.  PT to be ambulating with a cane both in and outside  Baseline:  Goal status: Ongoing  3.  PT to be able to walk for 15 minutes without increased pain. Baseline:  Goal status: Ongoing           4.   Pt pain level in Rt hip to be no greater than a 4/10 Goal status: Ongoing  LONG TERM GOALS: Target date: 11/16/2021   PT to be I in advanced HEP to improve his strength of his Rt hip to at least 4+/5 to be able to walk for 30 minutes without assistive device or difficulty. Baseline:  Goal status: Ongoing  2.  PT to be able to ascend and descend 8 steps using a hand rail in a reciprocal mannner. Baseline:   Goal status: Ongoing  3.  PT to be able to come sit to stand without UE assist 5 x in less than 20 seconds  Baseline:  Goal status: Ongoing  4.  PT to be sleeping 6 hours at a time Baseline:  Goal status: Ongoing 5.  Pt hip ROM to be improved to 110 to allow pt to squat down to pick items out of his garden            Goal status : Ongoing             6.  Pt pain level of Rt hip to be no greater than a 1/10             Goal status: Ongoing   PLAN: PT FREQUENCY: 2x/week  PT DURATION: 6 weeks  PLANNED INTERVENTIONS: Therapeutic exercises, Therapeutic activity, Balance training, Gait training, Patient/Family education, and Manual therapy  PLAN FOR NEXT SESSION: Continue WB activity to include but not limited to heel raises, rocker board, functional squat, sit to stand, and TKE based exercises.   Becky Sax, LPTA/CLT; [place] [phone]  10/05/2021, 1:07 PM

## 2021-10-08 ENCOUNTER — Ambulatory Visit (HOSPITAL_COMMUNITY): Payer: Medicare Other | Attending: Orthopedic Surgery | Admitting: Physical Therapy

## 2021-10-08 ENCOUNTER — Encounter (HOSPITAL_COMMUNITY): Payer: Self-pay | Admitting: Physical Therapy

## 2021-10-08 DIAGNOSIS — M25551 Pain in right hip: Secondary | ICD-10-CM | POA: Diagnosis present

## 2021-10-08 DIAGNOSIS — M6281 Muscle weakness (generalized): Secondary | ICD-10-CM

## 2021-10-08 DIAGNOSIS — M25651 Stiffness of right hip, not elsewhere classified: Secondary | ICD-10-CM | POA: Diagnosis present

## 2021-10-08 DIAGNOSIS — R262 Difficulty in walking, not elsewhere classified: Secondary | ICD-10-CM | POA: Diagnosis present

## 2021-10-08 NOTE — Therapy (Signed)
OUTPATIENT PHYSICAL THERAPY LOWER EXTREMITY TREATMENT   Patient Name: Mark Cross MRN: [id number] DOB:18-Aug-1940, 81 y.o., male Today's Date: 10/08/2021   PT End of Session - 10/08/21 1123     Visit Number 3    Number of Visits 12    Date for PT Re-Evaluation 11/14/21    Authorization Type [employer] medicare    Progress Note Due on Visit 10    PT Start Time 1127    PT Stop Time 1205    PT Time Calculation (min) 38 min    Activity Tolerance Patient limited by pain;No increased pain;Patient tolerated treatment well    Behavior During Therapy WFL for tasks assessed/performed              Past Medical History:  Diagnosis Date   DJD (degenerative joint disease), lumbosacral    Hyperlipidemia    On statin   Hypertension    Resistant; on 4 medications.;  Doppler evidence of RAS-but does not meet criteria for RA revascularization by CORAL Trial   Osteoarthritis of right hip    Bone-on-bone pain more severe pain (Dr. Margarita Ranaimothy Murphy)   PAD (peripheral artery disease) (HCC) 03/14/2021   ABIs to evaluate right foot pain: R ABI 0.67, TBI 0.34.  L ABI 1.12, TBI 0.52.  Indicates moderate RLE arterial disease.  No significant L LE disease.   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 Nolon BussingJoshua Robbins from vascular surgery for bilateral RAS and SMA stenosis given poorly controlled hypertension.  Recommended to continue medical management as he did not meet criteria based on CORAL Trial.   Type 2 diabetes mellitus without complication (HCC)    On metformin only.   Past Surgical History:  Procedure Laterality Date   INGUINAL HERNIA REPAIR Bilateral 08/10/2012   Procedure: HERNIA REPAIR INGUINAL ADULT;  Surgeon: Marlane HatcherWilliam S Bradford, MD;  Location: [facility];  Service: General;  Laterality: Bilateral;   RIGHT HEART CATH N/A 08/01/2021   Procedure: RIGHT HEART CATH;  Surgeon: Marykay LexHarding, Miken W, MD;  [facility];  Service: Cardiovascular;  Laterality: N/A;   TOTAL HIP  ARTHROPLASTY Right 10/02/2021   Procedure: TOTAL HIP ARTHROPLASTY ANTERIOR APPROACH;  Surgeon: Sheral ApleyMurphy, Timothy D, MD;  Location: [facility];  Service: Orthopedics;  Laterality: Right;   Patient Active Problem List   Diagnosis Date Noted   Pulmonary hypertension, unspecified (HCC) 08/01/2021   Hyperlipidemia associated with type 2 diabetes mellitus (HCC) 07/07/2021   Preop cardiovascular exam 07/06/2021   Bifascicular bundle branch block 07/06/2021   Resistant hypertension 07/06/2021   Systolic murmur 07/06/2021    PCP: Katharine LookSteven Knowlton   REFERRING PROVIDER: Sheral ApleyMurphy, Timothy D, MD   REFERRING DIAG: PT eval/tx for post op THR (DOS 10/02/2021)   THERAPY DIAG:  Difficulty in walking, muscle weakness   Rationale for Evaluation and Treatment Rehabilitation  ONSET DATE: 10/02/2021  SUBJECTIVE:   SUBJECTIVE STATEMENT: States hip is off and on with one good and one bad day. Exercises are going well and they have been helping    PERTINENT HISTORY: DJD, Back pain, HTN, PAD, DM   PAIN:  Are you having pain? Yes: NPRS scale: 7/10, worst pain is at a 10/10 Pain location: Rt hip Pain description: Sharp pain depending upon movement Aggravating factors: activity  Relieving factors: ice, meds   PRECAUTIONS: Anterior hip  WEIGHT BEARING RESTRICTIONS Yes /wbat  FALLS:  Has patient fallen in last 6 months? No  LIVING ENVIRONMENT: Lives with: lives with their family  Lives in: House/apartment Stairs: Yes: External: 3 steps; bilateral but cannot reach both Has following equipment at home: Single point cane and Walker - 2 wheeled  OCCUPATION: retired   PLOF: Independent, pt likes to go fishing, he would like to be able to have a garden next year as he normally has a garden but has not been able to to this for the past two years.    PATIENT GOALS To be able to walk without assistive device, To be able to move easier, to have no pain in his hip and to sleep better.     OBJECTIVE:    PATIENT SURVEYS:  FOTO 38  COGNITION:  Overall cognitive status: Within functional limits for tasks assessed      EDEMA:  Noted lateral swelling    LOWER EXTREMITY ROM:  Active ROM Right eval Left eval  Hip flexion 45   Hip extension    Hip abduction    Hip adduction    Hip internal rotation    Hip external rotation    Knee flexion    Knee extension -10   Ankle dorsiflexion    Ankle plantarflexion    Ankle inversion    Ankle eversion     (Blank rows = not tested)  LOWER EXTREMITY MMT:   MMT Right eval Left eval  Hip flexion 3-   Hip extension    Hip abduction    Hip adduction    Hip internal rotation    Hip external rotation    Knee flexion    Knee extension 3+   Ankle dorsiflexion 4   Ankle plantarflexion    Ankle inversion    Ankle eversion     (Blank rows = not tested) FUNCTIONAL TESTS:  30 seconds sit to stand: with UE assist  4x  2 minute walk test: PT ambulates with RW:  x 60 ft.      TODAY'S TREATMENT: 10/08/21 HR 2x 10  Lateral weight shifting x 20 bilateral  Standing march alternating 2x 10  Standing hip abduction 1x 10 bilateral  Seated hip abduction isometric 10 x 10 second holds with belt LAQ 10 x 5 seconds bilateral  STS 1x 3 with intermittent UE support and assist, 1x 3 with black foam on seat hands on thighs   6//2/23: Reviewed goals, educated importance of HEP compliance for maximal benefits. Supine: quad sets 10x 5"  Heel slide 5x  Bridge with cueing to reduce ER 10x 5" Sitting: LAQ 10x  STS 5x with 1 HHA Standing:   Weight shifting lateral 20x  Heel raise 10x  Gait training, cueing for heel to toe mechanics, knee extension and equal stride length  10/03/2021: Evaluation Sitting :  LAQ x 10 Supine:  Ankle pumps, quad set, glut set x 10; heel slides x 5 reps each.     PATIENT EDUCATION:  Education details: HEP [id number] Person educated: Patient Education method: Explanation, Verbal cues, and Handouts Education  comprehension: verbalized understanding   HOME EXERCISE PROGRAM: Sitting :  LAQ x 10 Supine:  Ankle pumps, quad set, glut set, heel slides x 10 reps each.     ASSESSMENT:  CLINICAL IMPRESSION: Patient continues to ambulate with use of RW with step too pattern lacking TKE bilaterally R>L. Continued with previously completed standing exercises with intermittent cueing for mechanics and positioning. Patient requires bilateral UE support for most exercises completed due to apprehension and difficulty completing with unilateral UE support despite cueing and assist. Great difficulty with standing hip abduction due  to severe glute weakness bilaterally, transitioned to hip isometrics. Moderately fatigued at end of session. Patient will continue to benefit from physical therapy in order to improve function and reduce impairment.   OBJECTIVE IMPAIRMENTS Abnormal gait, decreased activity tolerance, decreased balance, decreased mobility, difficulty walking, decreased ROM, decreased strength, increased edema, increased fascial restrictions, and pain.   ACTIVITY LIMITATIONS carrying, lifting, bending, sitting, standing, squatting, sleeping, stairs, bed mobility, bathing, toileting, dressing, reach over head, locomotion level, and caring for others  PARTICIPATION LIMITATIONS: cleaning, driving, shopping, community activity, and yard work  PERSONAL FACTORS Age are also affecting patient's functional outcome.   REHAB POTENTIAL: Good  CLINICAL DECISION MAKING: Stable/uncomplicated  EVALUATION COMPLEXITY: Moderate   GOALS: Goals reviewed with patient? No  SHORT TERM GOALS: Target date: 10/24/2021   PT to be I in HEP Baseline: Goal status: Ongoing  2.  PT to be ambulating with a cane both in and outside  Baseline:  Goal status: Ongoing  3.  PT to be able to walk for 15 minutes without increased pain. Baseline:  Goal status: Ongoing           4.   Pt pain level in Rt hip to be no greater than  a 4/10 Goal status: Ongoing  LONG TERM GOALS: Target date: 11/14/2021   PT to be I in advanced HEP to improve his strength of his Rt hip to at least 4+/5 to be able to walk for 30 minutes without assistive device or difficulty. Baseline:  Goal status: Ongoing  2.  PT to be able to ascend and descend 8 steps using a hand rail in a reciprocal mannner. Baseline:  Goal status: Ongoing  3.  PT to be able to come sit to stand without UE assist 5 x in less than 20 seconds  Baseline:  Goal status: Ongoing  4.  PT to be sleeping 6 hours at a time Baseline:  Goal status: Ongoing 5.  Pt hip ROM to be improved to 110 to allow pt to squat down to pick items out of his garden            Goal status : Ongoing             6.  Pt pain level of Rt hip to be no greater than a 1/10             Goal status: Ongoing   PLAN: PT FREQUENCY: 2x/week  PT DURATION: 6 weeks  PLANNED INTERVENTIONS: Therapeutic exercises, Therapeutic activity, Balance training, Gait training, Patient/Family education, and Manual therapy  PLAN FOR NEXT SESSION: Continue WB activity to include but not limited to heel raises, rocker board, functional squat, sit to stand, and TKE based exercises.   11:24 AM, 10/08/21 Wyman Songster PT, DPT Physical Therapist at [facility]

## 2021-10-12 ENCOUNTER — Ambulatory Visit (HOSPITAL_COMMUNITY): Payer: Medicare Other | Admitting: Physical Therapy

## 2021-10-12 DIAGNOSIS — R262 Difficulty in walking, not elsewhere classified: Secondary | ICD-10-CM

## 2021-10-12 DIAGNOSIS — M25651 Stiffness of right hip, not elsewhere classified: Secondary | ICD-10-CM

## 2021-10-12 DIAGNOSIS — M25551 Pain in right hip: Secondary | ICD-10-CM

## 2021-10-12 DIAGNOSIS — M6281 Muscle weakness (generalized): Secondary | ICD-10-CM

## 2021-10-12 NOTE — Therapy (Signed)
OUTPATIENT PHYSICAL THERAPY LOWER EXTREMITY TREATMENT   Patient Name: Mark Cross MRN: [id number] DOB:1941/02/17, 81 y.o., male Today's Date: 10/12/2021   PT End of Session - 10/12/21 0830     Visit Number 4    Number of Visits 12    Date for PT Re-Evaluation 11/14/21    Authorization Type [employer] medicare    Progress Note Due on Visit 10    PT Start Time [phone]    PT Stop Time 0915    PT Time Calculation (min) 42 min    Activity Tolerance Patient limited by pain;No increased pain;Patient tolerated treatment well    Behavior During Therapy WFL for tasks assessed/performed              Past Medical History:  Diagnosis Date   DJD (degenerative joint disease), lumbosacral    Hyperlipidemia    On statin   Hypertension    Resistant; on 4 medications.;  Doppler evidence of RAS-but does not meet criteria for RA revascularization by CORAL Trial   Osteoarthritis of right hip    Bone-on-bone pain more severe pain (Dr. Margarita Rana)   PAD (peripheral artery disease) (HCC) 03/14/2021   ABIs to evaluate right foot pain: R ABI 0.67, TBI 0.34.  L ABI 1.12, TBI 0.52.  Indicates moderate RLE arterial disease.  No significant L LE disease.   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 Nolon Bussing from vascular surgery for bilateral RAS and SMA stenosis given poorly controlled hypertension.  Recommended to continue medical management as he did not meet criteria based on CORAL Trial.   Type 2 diabetes mellitus without complication (HCC)    On metformin only.   Past Surgical History:  Procedure Laterality Date   INGUINAL HERNIA REPAIR Bilateral 08/10/2012   Procedure: HERNIA REPAIR INGUINAL ADULT;  Surgeon: Marlane Hatcher, MD;  Location: [facility];  Service: General;  Laterality: Bilateral;   RIGHT HEART CATH N/A 08/01/2021   Procedure: RIGHT HEART CATH;  Surgeon: Marykay Lex, MD;  Location: [facility] INVASIVE CV LAB;  Service: Cardiovascular;  Laterality: N/A;   TOTAL HIP  ARTHROPLASTY Right 10/02/2021   Procedure: TOTAL HIP ARTHROPLASTY ANTERIOR APPROACH;  Surgeon: Sheral Apley, MD;  Location: [facility];  Service: Orthopedics;  Laterality: Right;   Patient Active Problem List   Diagnosis Date Noted   Pulmonary hypertension, unspecified (HCC) 08/01/2021   Hyperlipidemia associated with type 2 diabetes mellitus (HCC) 07/07/2021   Preop cardiovascular exam 07/06/2021   Bifascicular bundle branch block 07/06/2021   Resistant hypertension 07/06/2021   Systolic murmur 07/06/2021    PCP: Katharine Look   REFERRING PROVIDER: Sheral Apley, MD   REFERRING DIAG: PT eval/tx for post op THR (DOS 10/02/2021)   THERAPY DIAG:  Difficulty in walking, muscle weakness   Rationale for Evaluation and Treatment Rehabilitation  ONSET DATE: 10/02/2021  SUBJECTIVE:   SUBJECTIVE STATEMENT:   Pt states that yesterday he walked in the yard he was in no pain.  He is sore now.  PERTINENT HISTORY: DJD, Back pain, HTN, PAD, DM   PAIN:  Are you having pain? Yes: NPRS scale: 5/10, worst pain is at a 10/10 Pain location: Rt hip Pain description: Sharp pain depending upon movement Aggravating factors: activity  Relieving factors: ice, meds   PRECAUTIONS: Anterior hip  WEIGHT BEARING RESTRICTIONS Yes /wbat  FALLS:  Has patient fallen in last 6 months? No  LIVING ENVIRONMENT: Lives with: lives with their family Lives in: House/apartment  Stairs: Yes: External: 3 steps; bilateral but cannot reach both Has following equipment at home: Single point cane and Walker - 2 wheeled  OCCUPATION: retired   PLOF: Independent, pt likes to go fishing, he would like to be able to have a garden next year as he normally has a garden but has not been able to to this for the past two years.    PATIENT GOALS To be able to walk without assistive device, To be able to move easier, to have no pain in his hip and to sleep better.     OBJECTIVE:   PATIENT SURVEYS:  FOTO  38  COGNITION:  Overall cognitive status: Within functional limits for tasks assessed      EDEMA:  Noted lateral swelling    LOWER EXTREMITY ROM:  Active ROM Right eval Left eval  Hip flexion 45   Hip extension    Hip abduction    Hip adduction    Hip internal rotation    Hip external rotation    Knee flexion    Knee extension -10   Ankle dorsiflexion    Ankle plantarflexion    Ankle inversion    Ankle eversion     (Blank rows = not tested)  LOWER EXTREMITY MMT:   MMT Right eval Left eval  Hip flexion 3-   Hip extension    Hip abduction    Hip adduction    Hip internal rotation    Hip external rotation    Knee flexion    Knee extension 3+   Ankle dorsiflexion 4   Ankle plantarflexion    Ankle inversion    Ankle eversion     (Blank rows = not tested) FUNCTIONAL TESTS:  30 seconds sit to stand: with UE assist  4x  2 minute walk test: PT ambulates with RW:  x 60 ft.      TODAY'S TREATMENT:                        10/12/21 Heel raises x 10 Rocker board x 2'    Slant board stretch 30" x 3  Forward step up x 10  both right and left                     Standing march alternating x 10  Standing hip abduction 1x 10 bilateral green thera- band  Standing extension x 10 B green thera-band LAQ 10 x 5 seconds bilateral  STS x5 with intermittent UE support   10/08/21 HR 2x 10  Lateral weight shifting x 20 bilateral  Standing march alternating 2x 10  Standing hip abduction 1x 10 bilateral  Seated hip abduction isometric 10 x 10 second holds with belt LAQ 10 x 5 seconds bilateral  STS 1x 3 with intermittent UE support and assist, 1x 3 with black foam on seat hands on thighs      PATIENT EDUCATION:  Education details: HEP [id number], eval  [id number] Person educated: Patient Education method: Explanation, Verbal cues, and Handouts Education comprehension: verbalized understanding   HOME EXERCISE PROGRAM:                        4/[id number]                         Standing heel raises, squats, sit to stand and bridges  Eval:Supine:  Ankle pumps, quad set, glut set, heel slides  x 10 reps each.     ASSESSMENT:  CLINICAL IMPRESSION: Progressed standing exercises with therapist needing to physically assist in an upright position while pt performed the exercises.  Exercises were difficult for pt but did not increase pain. PT will continue to benefit from skilled PT for strengthening, balance and gt training OBJECTIVE IMPAIRMENTS Abnormal gait, decreased activity tolerance, decreased balance, decreased mobility, difficulty walking, decreased ROM, decreased strength, increased edema, increased fascial restrictions, and pain.   ACTIVITY LIMITATIONS carrying, lifting, bending, sitting, standing, squatting, sleeping, stairs, bed mobility, bathing, toileting, dressing, reach over head, locomotion level, and caring for others  PARTICIPATION LIMITATIONS: cleaning, driving, shopping, community activity, and yard work  PERSONAL FACTORS Age are also affecting patient's functional outcome.   REHAB POTENTIAL: Good  CLINICAL DECISION MAKING: Stable/uncomplicated  EVALUATION COMPLEXITY: Moderate   GOALS: Goals reviewed with patient? No  SHORT TERM GOALS: Target date: 10/24/2021   PT to be I in HEP Baseline: Goal status: Ongoing  2.  PT to be ambulating with a cane both in and outside  Baseline:  Goal status: Ongoing  3.  PT to be able to walk for 15 minutes without increased pain. Baseline:  Goal status: Ongoing           4.   Pt pain level in Rt hip to be no greater than a 4/10 Goal status: Ongoing  LONG TERM GOALS: Target date: 11/14/2021   PT to be I in advanced HEP to improve his strength of his Rt hip to at least 4+/5 to be able to walk for 30 minutes without assistive device or difficulty. Baseline:  Goal status: Ongoing  2.  PT to be able to ascend and descend 8 steps using a hand rail in a reciprocal mannner. Baseline:  Goal status:  Ongoing  3.  PT to be able to come sit to stand without UE assist 5 x in less than 20 seconds  Baseline:  Goal status: Ongoing  4.  PT to be sleeping 6 hours at a time Baseline:  Goal status: Ongoing 5.  Pt hip ROM to be improved to 110 to allow pt to squat down to pick items out of his garden            Goal status : Ongoing             6.  Pt pain level of Rt hip to be no greater than a 1/10             Goal status: Ongoing   PLAN: PT FREQUENCY: 2x/week  PT DURATION: 6 weeks  PLANNED INTERVENTIONS: Therapeutic exercises, Therapeutic activity, Balance training, Gait training, Patient/Family education, and Manual therapy  PLAN FOR NEXT SESSION: Continue to progress  WB activity to include standing  TKE as well as gt training with cane.Virgina Organ, PT CLT [phone]

## 2021-10-15 ENCOUNTER — Ambulatory Visit (HOSPITAL_COMMUNITY): Payer: Medicare Other | Attending: Family Medicine | Admitting: Physical Therapy

## 2021-10-15 DIAGNOSIS — M25551 Pain in right hip: Secondary | ICD-10-CM | POA: Insufficient documentation

## 2021-10-15 DIAGNOSIS — M25651 Stiffness of right hip, not elsewhere classified: Secondary | ICD-10-CM | POA: Diagnosis not present

## 2021-10-15 DIAGNOSIS — M6281 Muscle weakness (generalized): Secondary | ICD-10-CM | POA: Insufficient documentation

## 2021-10-15 DIAGNOSIS — R262 Difficulty in walking, not elsewhere classified: Secondary | ICD-10-CM | POA: Diagnosis present

## 2021-10-15 NOTE — Therapy (Signed)
OUTPATIENT PHYSICAL THERAPY LOWER EXTREMITY TREATMENT   Patient Name: Mark MallickDavid L Hemmelgarn MRN: [id number] DOB:[birth year], 81 y.o., male Today's Date: 10/15/2021   PT End of Session - 10/15/21 1130     Visit Number 5    Number of Visits 12    Date for PT Re-Evaluation 11/14/21    Authorization Type [employer] medicare    Progress Note Due on Visit 10    PT Start Time 1048    PT Stop Time 1128    PT Time Calculation (min) 40 min    Activity Tolerance Patient limited by pain;No increased pain;Patient tolerated treatment well    Behavior During Therapy WFL for tasks assessed/performed               Past Medical History:  Diagnosis Date   DJD (degenerative joint disease), lumbosacral    Hyperlipidemia    On statin   Hypertension    Resistant; on 4 medications.;  Doppler evidence of RAS-but does not meet criteria for RA revascularization by CORAL Trial   Osteoarthritis of right hip    Bone-on-bone pain more severe pain (Dr. Margarita Ranaimothy Murphy)   PAD (peripheral artery disease) (HCC) 03/14/2021   ABIs to evaluate right foot pain: R ABI 0.67, TBI 0.34.  L ABI 1.12, TBI 0.52.  Indicates moderate RLE arterial disease.  No significant L LE disease.   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 Nolon BussingJoshua Robbins from vascular surgery for bilateral RAS and SMA stenosis given poorly controlled hypertension.  Recommended to continue medical management as he did not meet criteria based on CORAL Trial.   Type 2 diabetes mellitus without complication (HCC)    On metformin only.   Past Surgical History:  Procedure Laterality Date   INGUINAL HERNIA REPAIR Bilateral 08/10/2012   Procedure: HERNIA REPAIR INGUINAL ADULT;  Surgeon: Marlane HatcherWilliam S Bradford, MD;  Location: [facility];  Service: General;  Laterality: Bilateral;   RIGHT HEART CATH N/A 08/01/2021   Procedure: RIGHT HEART CATH;  Surgeon: Marykay LexHarding, Eoghan W, MD;  [facility];  Service: Cardiovascular;  Laterality: N/A;   TOTAL HIP  ARTHROPLASTY Right 10/02/2021   Procedure: TOTAL HIP ARTHROPLASTY ANTERIOR APPROACH;  Surgeon: Sheral ApleyMurphy, Timothy D, MD;  Location: [facility];  Service: Orthopedics;  Laterality: Right;   Patient Active Problem List   Diagnosis Date Noted   Pulmonary hypertension, unspecified (HCC) 08/01/2021   Hyperlipidemia associated with type 2 diabetes mellitus (HCC) 07/07/2021   Preop cardiovascular exam 07/06/2021   Bifascicular bundle branch block 07/06/2021   Resistant hypertension 07/06/2021   Systolic murmur 07/06/2021    PCP: Katharine LookSteven Knowlton   REFERRING PROVIDER: Sheral ApleyMurphy, Timothy D, MD   REFERRING DIAG: PT eval/tx for post op THR (DOS 10/02/2021)   THERAPY DIAG:  Difficulty in walking, muscle weakness   Rationale for Evaluation and Treatment Rehabilitation  ONSET DATE: 10/02/2021  SUBJECTIVE:   SUBJECTIVE STATEMENT:   Pt states that yesterday he walked in the yard he was in no pain.  He is sore now.  PERTINENT HISTORY: DJD, Back pain, HTN, PAD, DM   PAIN:  Are you having pain? Yes: NPRS scale: 5/10, worst pain is at a 10/10 Pain location: Rt hip Pain description: Sharp pain depending upon movement Aggravating factors: activity  Relieving factors: ice, meds   PRECAUTIONS: Anterior hip  WEIGHT BEARING RESTRICTIONS Yes /wbat  FALLS:  Has patient fallen in last 6 months? No  LIVING ENVIRONMENT: Lives with: lives with their family Lives in:  House/apartment Stairs: Yes: External: 3 steps; bilateral but cannot reach both Has following equipment at home: Single point cane and Walker - 2 wheeled  OCCUPATION: retired   PLOF: Independent, pt likes to go fishing, he would like to be able to have a garden next year as he normally has a garden but has not been able to to this for the past two years.    PATIENT GOALS To be able to walk without assistive device, To be able to move easier, to have no pain in his hip and to sleep better.     OBJECTIVE:   PATIENT SURVEYS:  FOTO  38  COGNITION:  Overall cognitive status: Within functional limits for tasks assessed      EDEMA:  Noted lateral swelling    LOWER EXTREMITY ROM:  Active ROM Right eval Left eval  Hip flexion 45   Hip extension    Hip abduction    Hip adduction    Hip internal rotation    Hip external rotation    Knee flexion    Knee extension -10   Ankle dorsiflexion    Ankle plantarflexion    Ankle inversion    Ankle eversion     (Blank rows = not tested)  LOWER EXTREMITY MMT:   MMT Right eval Left eval  Hip flexion 3-   Hip extension    Hip abduction    Hip adduction    Hip internal rotation    Hip external rotation    Knee flexion    Knee extension 3+   Ankle dorsiflexion 4   Ankle plantarflexion    Ankle inversion    Ankle eversion     (Blank rows = not tested) FUNCTIONAL TESTS:  30 seconds sit to stand: with UE assist  4x  2 minute walk test: PT ambulates with RW:  x 60 ft.      TODAY'S TREATMENT:                        10/15/21 Standing: Heel raises x 15 Hamstring stretch 12" step 2x30"  Slant board stretch 30" x 3  Forward step up x 10  both right and left                     Standing march alternating 2X10 with 1 UE assist Standing side toe tap for abduction 1x 10 bilateral green thera- band  Standing back toe tap for extension x 10 B green thera-band Body craft pulley machine TKE for Rt LE 3PL 2X10 using walker for UE support Seated: LAQ 10 x 5 seconds bilateral  STS with black foam in standard chair no UE 10X   10/12/21 Heel raises x 10 Rocker board x 2'    Slant board stretch 30" x 3  Forward step up x 10  both right and left                     Standing march alternating x 10  Standing hip abduction 1x 10 bilateral green thera- band  Standing extension x 10 B green thera-band LAQ 10 x 5 seconds bilateral  STS x5 with intermittent UE support   10/08/21 HR 2x 10  Lateral weight shifting x 20 bilateral  Standing march alternating 2x 10  Standing  hip abduction 1x 10 bilateral  Seated hip abduction isometric 10 x 10 second holds with belt LAQ 10 x 5 seconds bilateral  STS 1x 3  with intermittent UE support and assist, 1x 3 with black foam on seat hands on thighs      PATIENT EDUCATION:  Education details: HEP [id number], eval  [id number] Person educated: Patient Education method: Explanation, Verbal cues, and Handouts Education comprehension: verbalized understanding   HOME EXERCISE PROGRAM:                        3/6:[id number]                        Standing heel raises, squats, sit to stand and bridges  Eval:Supine:  Ankle pumps, quad set, glut set, heel slides x 10 reps each.     ASSESSMENT:  CLINICAL IMPRESSION: Pt continues to require heavy cues, both verbal and tactile to complete therex in upright posturing as tends to go forward bent.  Pt unable to keep count of therex and has poor recall and general technique with form.  Added standing hamstring stretch and began TKE with bodycraft to improve knee extension and quad weakness.  Pt also maintains knee flexion during ambulation with tendency to take short steps and step to gait with Lt LE.  Cues needed to step through.   Pt is not ready at this point for gait using SPC.  Able to complete sit to  stands without UE assist with elevated surface using black foam today.  Pt does have fear of falling.  PT will continue to benefit from skilled PT for strengthening, balance and gt training  OBJECTIVE IMPAIRMENTS Abnormal gait, decreased activity tolerance, decreased balance, decreased mobility, difficulty walking, decreased ROM, decreased strength, increased edema, increased fascial restrictions, and pain.   ACTIVITY LIMITATIONS carrying, lifting, bending, sitting, standing, squatting, sleeping, stairs, bed mobility, bathing, toileting, dressing, reach over head, locomotion level, and caring for others  PARTICIPATION LIMITATIONS: cleaning, driving, shopping, community activity, and  yard work  PERSONAL FACTORS Age are also affecting patient's functional outcome.   REHAB POTENTIAL: Good  CLINICAL DECISION MAKING: Stable/uncomplicated  EVALUATION COMPLEXITY: Moderate   GOALS: Goals reviewed with patient? No  SHORT TERM GOALS: Target date: 10/24/2021   PT to be I in HEP Baseline: Goal status: Ongoing  2.  PT to be ambulating with a cane both in and outside  Baseline:  Goal status: Ongoing  3.  PT to be able to walk for 15 minutes without increased pain. Baseline:  Goal status: Ongoing           4.   Pt pain level in Rt hip to be no greater than a 4/10 Goal status: Ongoing  LONG TERM GOALS: Target date: 11/14/2021   PT to be I in advanced HEP to improve his strength of his Rt hip to at least 4+/5 to be able to walk for 30 minutes without assistive device or difficulty. Baseline:  Goal status: Ongoing  2.  PT to be able to ascend and descend 8 steps using a hand rail in a reciprocal mannner. Baseline:  Goal status: Ongoing  3.  PT to be able to come sit to stand without UE assist 5 x in less than 20 seconds  Baseline:  Goal status: Ongoing  4.  PT to be sleeping 6 hours at a time Baseline:  Goal status: Ongoing 5.  Pt hip ROM to be improved to 110 to allow pt to squat down to pick items out of his garden            Goal  status : Ongoing             6.  Pt pain level of Rt hip to be no greater than a 1/10             Goal status: Ongoing   PLAN: PT FREQUENCY: 2x/week  PT DURATION: 6 weeks  PLANNED INTERVENTIONS: Therapeutic exercises, Therapeutic activity, Balance training, Gait training, Patient/Family education, and Manual therapy  PLAN FOR NEXT SESSION: Continue to progress  WB activity to include standing  Gait training with cane when ready.    Lurena Nida, PTA/CLT [facility] [place] Ph: [phone]

## 2021-10-19 ENCOUNTER — Ambulatory Visit (HOSPITAL_COMMUNITY): Payer: Medicare Other

## 2021-10-19 ENCOUNTER — Encounter (HOSPITAL_COMMUNITY): Payer: Self-pay

## 2021-10-19 DIAGNOSIS — R262 Difficulty in walking, not elsewhere classified: Secondary | ICD-10-CM | POA: Diagnosis not present

## 2021-10-19 DIAGNOSIS — M25651 Stiffness of right hip, not elsewhere classified: Secondary | ICD-10-CM

## 2021-10-19 DIAGNOSIS — M25551 Pain in right hip: Secondary | ICD-10-CM

## 2021-10-19 DIAGNOSIS — M6281 Muscle weakness (generalized): Secondary | ICD-10-CM

## 2021-10-19 NOTE — Therapy (Signed)
OUTPATIENT PHYSICAL THERAPY LOWER EXTREMITY TREATMENT   Patient Name: Mark Cross MRN: [id number] DOB:Jun 25, 1940, 81 y.o., male Today's Date: 10/19/2021   PT End of Session - 10/19/21 0918     Visit Number 6    Number of Visits 12    Date for PT Re-Evaluation 11/14/21    Authorization Type [employer] medicare    Progress Note Due on Visit 10    PT Start Time [phone]    PT Stop Time 0915    PT Time Calculation (min) 43 min    Equipment Utilized During Treatment Gait belt    Activity Tolerance Patient tolerated treatment well    Behavior During Therapy WFL for tasks assessed/performed                Past Medical History:  Diagnosis Date   DJD (degenerative joint disease), lumbosacral    Hyperlipidemia    On statin   Hypertension    Resistant; on 4 medications.;  Doppler evidence of RAS-but does not meet criteria for RA revascularization by CORAL Trial   Osteoarthritis of right hip    Bone-on-bone pain more severe pain (Dr. Margarita Rana)   PAD (peripheral artery disease) (HCC) 03/14/2021   ABIs to evaluate right foot pain: R ABI 0.67, TBI 0.34.  L ABI 1.12, TBI 0.52.  Indicates moderate RLE arterial disease.  No significant L LE disease.   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 Nolon Bussing from vascular surgery for bilateral RAS and SMA stenosis given poorly controlled hypertension.  Recommended to continue medical management as he did not meet criteria based on CORAL Trial.   Type 2 diabetes mellitus without complication (HCC)    On metformin only.   Past Surgical History:  Procedure Laterality Date   INGUINAL HERNIA REPAIR Bilateral 08/10/2012   Procedure: HERNIA REPAIR INGUINAL ADULT;  Surgeon: Marlane Hatcher, MD;  Location: [facility];  Service: General;  Laterality: Bilateral;   RIGHT HEART CATH N/A 08/01/2021   Procedure: RIGHT HEART CATH;  Surgeon: Marykay Lex, MD;  [facility] INV[facility] CV LAB;  Service: Cardiovascular;  Laterality: N/A;    TOTAL HIP ARTHROPLASTY Right 10/02/2021   Procedure: TOTAL HIP ARTHROPLASTY ANTERIOR APPROACH;  Surgeon: Sheral Apley, MD;  Location: [facility];  Service: Orthopedics;  Laterality: Right;   Patient Active Problem List   Diagnosis Date Noted   Pulmonary hypertension, unspecified (HCC) 08/01/2021   Hyperlipidemia associated with type 2 diabetes mellitus (HCC) 07/07/2021   Preop cardiovascular exam 07/06/2021   Bifascicular bundle branch block 07/06/2021   Resistant hypertension 07/06/2021   Systolic murmur 07/06/2021    PCP: Katharine Look   REFERRING PROVIDER: Sheral Apley, MD   REFERRING DIAG: PT eval/tx for post op THR (DOS 10/02/2021)   THERAPY DIAG:  Difficulty in walking, muscle weakness   Rationale for Evaluation and Treatment Rehabilitation  ONSET DATE: 10/02/2021  SUBJECTIVE:   SUBJECTIVE STATEMENT:    Pt stated his hip is sore today.  Saw MD this Wednesday and is happy with progress.  Returns in 4 weeks.    PERTINENT HISTORY: DJD, Back pain, HTN, PAD, DM   PAIN:  Are you having pain? Yes: NPRS scale: 4/10 Pain location: Rt hip Pain description: Sharp pain depending upon movement Aggravating factors: activity  Relieving factors: ice, meds   PRECAUTIONS: Anterior hip  WEIGHT BEARING RESTRICTIONS Yes /wbat  FALLS:  Has patient fallen in last 6 months? No  LIVING ENVIRONMENT: Lives with: lives  with their family Lives in: House/apartment Stairs: Yes: External: 3 steps; bilateral but cannot reach both Has following equipment at home: Single point cane and Walker - 2 wheeled  OCCUPATION: retired   PLOF: Independent, pt likes to go fishing, he would like to be able to have a garden next year as he normally has a garden but has not been able to to this for the past two years.    PATIENT GOALS To be able to walk without assistive device, To be able to move easier, to have no pain in his hip and to sleep better.     OBJECTIVE:   PATIENT SURVEYS:   FOTO 38  COGNITION:  Overall cognitive status: Within functional limits for tasks assessed      EDEMA:  Noted lateral swelling    LOWER EXTREMITY ROM:  Active ROM Right eval Left eval  Hip flexion 45   Hip extension    Hip abduction    Hip adduction    Hip internal rotation    Hip external rotation    Knee flexion    Knee extension -10   Ankle dorsiflexion    Ankle plantarflexion    Ankle inversion    Ankle eversion     (Blank rows = not tested)  LOWER EXTREMITY MMT:   MMT Right eval Left eval  Hip flexion 3-   Hip extension    Hip abduction    Hip adduction    Hip internal rotation    Hip external rotation    Knee flexion    Knee extension 3+   Ankle dorsiflexion 4   Ankle plantarflexion    Ankle inversion    Ankle eversion     (Blank rows = not tested) FUNCTIONAL TESTS:  30 seconds sit to stand: with UE assist  4x  2 minute walk test: PT ambulates with RW:  x 60 ft.      TODAY'S TREATMENT:                        10/19/21:   Standing: Gait training 239ft cueing for posture    Heel raises 15x incline slope with UE support    March alternating tapping opposite UE, required 1 HHA    Retro gait inside //bars 3RT with HHA    Sidestep 4RT with GTB around thigh 2RT with minimal to no HHA, pt cautious without     Bodycraft TKE BLE 3PL 15x 5" each with RW for UE support    Seated: STS 2 x 5reps eccentric control standard chair height      10/15/21 Standing: Heel raises x 15 Hamstring stretch 12" step 2x30"  Slant board stretch 30" x 3  Forward step up x 10  both right and left                     Standing march alternating 2X10 with 1 UE assist Standing side toe tap for abduction 1x 10 bilateral green thera- band  Standing back toe tap for extension x 10 B green thera-band Body craft pulley machine TKE for Rt LE 3PL 2X10 using walker for UE support Seated: LAQ 10 x 5 seconds bilateral  STS with black foam in standard chair no UE 10X   10/12/21 Heel  raises x 10 Rocker board x 2'    Slant board stretch 30" x 3  Forward step up x 10  both right and left  Standing march alternating x 10  Standing hip abduction 1x 10 bilateral green thera- band  Standing extension x 10 B green thera-band LAQ 10 x 5 seconds bilateral  STS x5 with intermittent UE support   10/08/21 HR 2x 10  Lateral weight shifting x 20 bilateral  Standing march alternating 2x 10  Standing hip abduction 1x 10 bilateral  Seated hip abduction isometric 10 x 10 second holds with belt LAQ 10 x 5 seconds bilateral  STS 1x 3 with intermittent UE support and assist, 1x 3 with black foam on seat hands on thighs      PATIENT EDUCATION:  Education details: HEP [id number], eval  [id number] Person educated: Patient Education method: Explanation, Verbal cues, and Handouts Education comprehension: verbalized understanding   HOME EXERCISE PROGRAM:                        3/8:[id number]                        Standing heel raises, squats, sit to stand and bridges  Eval:Supine:  Ankle pumps, quad set, glut set, heel slides x 10 reps each.     ASSESSMENT:  CLINICAL IMPRESSION: Session focus with LE strengthening, balance and gait training.  Pt with tendency to walk short stride length with knees flexed and forward bent posture.  Gait training to improve stride length to improve knee extension with more normalized mechanics.  Pt did require cueing through session to improve awareness of posture and knee extension.  Added retro gait for gluteal strengthening and balance training, UE support required.  Pt presents with good eccentric control with STS from standard chair height, no additional height required this session.  OBJECTIVE IMPAIRMENTS Abnormal gait, decreased activity tolerance, decreased balance, decreased mobility, difficulty walking, decreased ROM, decreased strength, increased edema, increased fascial restrictions, and pain.   ACTIVITY LIMITATIONS  carrying, lifting, bending, sitting, standing, squatting, sleeping, stairs, bed mobility, bathing, toileting, dressing, reach over head, locomotion level, and caring for others  PARTICIPATION LIMITATIONS: cleaning, driving, shopping, community activity, and yard work  PERSONAL FACTORS Age are also affecting patient's functional outcome.   REHAB POTENTIAL: Good  CLINICAL DECISION MAKING: Stable/uncomplicated  EVALUATION COMPLEXITY: Moderate   GOALS: Goals reviewed with patient? No  SHORT TERM GOALS: Target date: 10/24/2021   PT to be I in HEP Baseline: Goal status: Ongoing  2.  PT to be ambulating with a cane both in and outside  Baseline:  Goal status: Ongoing  3.  PT to be able to walk for 15 minutes without increased pain. Baseline:  Goal status: Ongoing           4.   Pt pain level in Rt hip to be no greater than a 4/10 Goal status: Ongoing  LONG TERM GOALS: Target date: 11/14/2021   PT to be I in advanced HEP to improve his strength of his Rt hip to at least 4+/5 to be able to walk for 30 minutes without assistive device or difficulty. Baseline:  Goal status: Ongoing  2.  PT to be able to ascend and descend 8 steps using a hand rail in a reciprocal mannner. Baseline:  Goal status: Ongoing  3.  PT to be able to come sit to stand without UE assist 5 x in less than 20 seconds  Baseline:  Goal status: Ongoing  4.  PT to be sleeping 6 hours at a time Baseline:  Goal status: Ongoing  5.  Pt hip ROM to be improved to 110 to allow pt to squat down to pick items out of his garden            Goal status : Ongoing             6.  Pt pain level of Rt hip to be no greater than a 1/10             Goal status: Ongoing   PLAN: PT FREQUENCY: 2x/week  PT DURATION: 6 weeks  PLANNED INTERVENTIONS: Therapeutic exercises, Therapeutic activity, Balance training, Gait training, Patient/Family education, and Manual therapy  PLAN FOR NEXT SESSION: Continue to progress  WB  activity to include standing  Gait training with cane when ready.  Balance activities as appropriate.   Becky Sax, LPTA/CLT; [place] [phone] 10/19/21 at 9:00

## 2021-10-22 ENCOUNTER — Ambulatory Visit (HOSPITAL_COMMUNITY): Payer: Medicare Other | Attending: Family Medicine | Admitting: Physical Therapy

## 2021-10-22 ENCOUNTER — Encounter (HOSPITAL_COMMUNITY): Payer: Self-pay | Admitting: Physical Therapy

## 2021-10-22 DIAGNOSIS — R269 Unspecified abnormalities of gait and mobility: Secondary | ICD-10-CM | POA: Insufficient documentation

## 2021-10-22 DIAGNOSIS — R262 Difficulty in walking, not elsewhere classified: Secondary | ICD-10-CM | POA: Diagnosis not present

## 2021-10-22 DIAGNOSIS — M6281 Muscle weakness (generalized): Secondary | ICD-10-CM

## 2021-10-22 DIAGNOSIS — Z96641 Presence of right artificial hip joint: Secondary | ICD-10-CM | POA: Insufficient documentation

## 2021-10-22 DIAGNOSIS — M25651 Stiffness of right hip, not elsewhere classified: Secondary | ICD-10-CM

## 2021-10-22 DIAGNOSIS — M25551 Pain in right hip: Secondary | ICD-10-CM

## 2021-10-22 NOTE — Therapy (Signed)
OUTPATIENT PHYSICAL THERAPY LOWER EXTREMITY TREATMENT   Patient Name: Mark Cross MRN: [id number] DOB:1941/04/24, 81 y.o., male Today's Date: 10/22/2021   PT End of Session - 10/22/21 1006     Visit Number 7    Number of Visits 12    Date for PT Re-Evaluation 11/14/21    Authorization Type [employer] medicare    Progress Note Due on Visit 10    PT Start Time 1007    PT Stop Time 1047    PT Time Calculation (min) 40 min    Activity Tolerance Patient tolerated treatment well    Behavior During Therapy WFL for tasks assessed/performed                Past Medical History:  Diagnosis Date   DJD (degenerative joint disease), lumbosacral    Hyperlipidemia    On statin   Hypertension    Resistant; on 4 medications.;  Doppler evidence of RAS-but does not meet criteria for RA revascularization by CORAL Trial   Osteoarthritis of right hip    Bone-on-bone pain more severe pain (Dr. Margarita Rana)   PAD (peripheral artery disease) (HCC) 03/14/2021   ABIs to evaluate right foot pain: R ABI 0.67, TBI 0.34.  L ABI 1.12, TBI 0.52.  Indicates moderate RLE arterial disease.  No significant L LE disease.   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 Nolon Bussing from vascular surgery for bilateral RAS and SMA stenosis given poorly controlled hypertension.  Recommended to continue medical management as he did not meet criteria based on CORAL Trial.   Type 2 diabetes mellitus without complication (HCC)    On metformin only.   Past Surgical History:  Procedure Laterality Date   INGUINAL HERNIA REPAIR Bilateral 08/10/2012   Procedure: HERNIA REPAIR INGUINAL ADULT;  Surgeon: Marlane Hatcher, MD;  Location: [facility];  Service: General;  Laterality: Bilateral;   RIGHT HEART CATH N/A 08/01/2021   Procedure: RIGHT HEART CATH;  Surgeon: Marykay Lex, MD;  Location: [facility];  Service: Cardiovascular;  Laterality: N/A;   TOTAL HIP ARTHROPLASTY Right 10/02/2021   Procedure:  TOTAL HIP ARTHROPLASTY ANTERIOR APPROACH;  Surgeon: Sheral Apley, MD;  Location: [facility];  Service: Orthopedics;  Laterality: Right;   Patient Active Problem List   Diagnosis Date Noted   Pulmonary hypertension, unspecified (HCC) 08/01/2021   Hyperlipidemia associated with type 2 diabetes mellitus (HCC) 07/07/2021   Preop cardiovascular exam 07/06/2021   Bifascicular bundle branch block 07/06/2021   Resistant hypertension 07/06/2021   Systolic murmur 07/06/2021    PCP: Katharine Look   REFERRING PROVIDER: Sheral Apley, MD   REFERRING DIAG: PT eval/tx for post op THR (DOS 10/02/2021)   THERAPY DIAG:  Difficulty in walking, muscle weakness   Rationale for Evaluation and Treatment Rehabilitation  ONSET DATE: 10/02/2021  SUBJECTIVE:   SUBJECTIVE STATEMENT:    Pt states that he is doing some of his exercises.  He has minimal pain at this time. PERTINENT HISTORY: DJD, Back pain, HTN, PAD, DM   PAIN:  Are you having pain? Yes: NPRS scale: 2/10 Pain location: Rt hip Pain description: Sharp pain depending upon movement Aggravating factors: activity  Relieving factors: ice, meds   PRECAUTIONS: Anterior hip  WEIGHT BEARING RESTRICTIONS Yes /wbat  FALLS:  Has patient fallen in last 6 months? No  LIVING ENVIRONMENT: Lives with: lives with their family Lives in: House/apartment Stairs: Yes: External: 3 steps; bilateral but cannot reach both  Has following equipment at home: Single point cane and Walker - 2 wheeled  OCCUPATION: retired   PLOF: Independent, pt likes to go fishing, he would like to be able to have a garden next year as he normally has a garden but has not been able to to this for the past two years.    PATIENT GOALS To be able to walk without assistive device, To be able to move easier, to have no pain in his hip and to sleep better.     OBJECTIVE:   PATIENT SURVEYS:  FOTO 38  COGNITION:  Overall cognitive status: Within functional limits for  tasks assessed      EDEMA:  Noted lateral swelling    LOWER EXTREMITY ROM:  Active ROM Right eval Left eval  Hip flexion 45   Hip extension    Hip abduction    Hip adduction    Hip internal rotation    Hip external rotation    Knee flexion    Knee extension -10   Ankle dorsiflexion    Ankle plantarflexion    Ankle inversion    Ankle eversion     (Blank rows = not tested)  LOWER EXTREMITY MMT:   MMT Right eval Left eval  Hip flexion 3-   Hip extension    Hip abduction    Hip adduction    Hip internal rotation    Hip external rotation    Knee flexion    Knee extension 3+   Ankle dorsiflexion 4   Ankle plantarflexion    Ankle inversion    Ankle eversion     (Blank rows = not tested) FUNCTIONAL TESTS:  30 seconds sit to stand: with UE assist  4x  2 minute walk test: PT ambulates with RW:  x 60 ft.      TODAY'S TREATMENT:               6/19/203:                Standing:    rocker board x 3/                                 Gait training 247ft cueing for posture with cane working on step thru.                                 Slant board stretch x on minute x 2                                  Single leg stance Lt with 1 finger tough, Rt with 2 finger touch x 5 reps each    Heel raises 15x                                  Toe raises on incline slope with UE support x10                                  Forward Step up on 4" step  Lateral step up x 10                                  Squat x 10         Seated: STS x 10 reps eccentric control standard chair height                         10/19/21:   Standing: Gait training 244ft cueing for posture    Heel raises 15x incline slope with UE support    March alternating tapping opposite UE, required 1 HHA    Retro gait inside //bars 3RT with HHA    Sidestep 4RT with GTB around thigh 2RT with minimal to no HHA, pt cautious without     Bodycraft TKE BLE 3PL 15x 5" each with RW for  UE support    Seated: STS 2 x 5reps eccentric control standard chair height      10/15/21 Standing: Heel raises x 15 Hamstring stretch 12" step 2x30"  Slant board stretch 30" x 3  Forward step up x 10  both right and left                     Standing march alternating 2X10 with 1 UE assist Standing side toe tap for abduction 1x 10 bilateral green thera- band  Standing back toe tap for extension x 10 B green thera-band Body craft pulley machine TKE for Rt LE 3PL 2X10 using walker for UE support Seated: LAQ 10 x 5 seconds bilateral  STS with black foam in standard chair no UE 10X   10/12/21 Heel raises x 10 Rocker board x 2'    Slant board stretch 30" x 3  Forward step up x 10  both right and left                     Standing march alternating x 10  Standing hip abduction 1x 10 bilateral green thera- band  Standing extension x 10 B green thera-band LAQ 10 x 5 seconds bilateral  STS x5 with intermittent UE support   10/08/21 HR 2x 10  Lateral weight shifting x 20 bilateral  Standing march alternating 2x 10  Standing hip abduction 1x 10 bilateral  Seated hip abduction isometric 10 x 10 second holds with belt LAQ 10 x 5 seconds bilateral  STS 1x 3 with intermittent UE support and assist, 1x 3 with black foam on seat hands on thighs      PATIENT EDUCATION:  Education details: HEP [id number], eval  [id number] Person educated: Patient Education method: Explanation, Verbal cues, and Handouts Education comprehension: verbalized understanding   HOME EXERCISE PROGRAM:                        3/7:[id number]                        Standing heel raises, squats, sit to stand and bridges  Eval:Supine:  Ankle pumps, quad set, glut set, heel slides x 10 reps each.     ASSESSMENT:  CLINICAL IMPRESSION: Gt trained pt with a standard cane.  PT fearful of walking and tends to walk flat footed.  Therapist added slant board stretch and toe raises to improve ankle ROM as pt complained of ankle  pain no hip pain when walking.  OBJECTIVE IMPAIRMENTS Abnormal gait, decreased activity tolerance, decreased balance, decreased mobility, difficulty walking, decreased ROM, decreased strength, increased edema, increased fascial restrictions, and pain.   ACTIVITY LIMITATIONS carrying, lifting, bending, sitting, standing, squatting, sleeping, stairs, bed mobility, bathing, toileting, dressing, reach over head, locomotion level, and caring for others  PARTICIPATION LIMITATIONS: cleaning, driving, shopping, community activity, and yard work  PERSONAL FACTORS Age are also affecting patient's functional outcome.   REHAB POTENTIAL: Good  CLINICAL DECISION MAKING: Stable/uncomplicated  EVALUATION COMPLEXITY: Moderate   GOALS: Goals reviewed with patient? No  SHORT TERM GOALS: Target date: 10/24/2021   PT to be I in HEP Baseline: Goal status: Ongoing  2.  PT to be ambulating with a cane both in and outside  Baseline:  Goal status: Ongoing  3.  PT to be able to walk for 15 minutes without increased pain. Baseline:  Goal status: Ongoing           4.   Pt pain level in Rt hip to be no greater than a 4/10 Goal status: Ongoing  LONG TERM GOALS: Target date: 11/14/2021   PT to be I in advanced HEP to improve his strength of his Rt hip to at least 4+/5 to be able to walk for 30 minutes without assistive device or difficulty. Baseline:  Goal status: Ongoing  2.  PT to be able to ascend and descend 8 steps using a hand rail in a reciprocal mannner. Baseline:  Goal status: Ongoing  3.  PT to be able to come sit to stand without UE assist 5 x in less than 20 seconds  Baseline:  Goal status: Ongoing  4.  PT to be sleeping 6 hours at a time Baseline:  Goal status: Ongoing 5.  Pt hip ROM to be improved to 110 to allow pt to squat down to pick items out of his garden            Goal status : Ongoing             6.  Pt pain level of Rt hip to be no greater than a 1/10             Goal  status: Ongoing   PLAN: PT FREQUENCY: 2x/week  PT DURATION: 6 weeks  PLANNED INTERVENTIONS: Therapeutic exercises, Therapeutic activity, Balance training, Gait training, Patient/Family education, and Manual therapy  PLAN FOR NEXT SESSION: Continue to progress  WB activity to include standing  Gait training with cane .  Balance activities as appropriate.  Virgina Organ, PT CLT [phone]

## 2021-10-26 ENCOUNTER — Ambulatory Visit (HOSPITAL_COMMUNITY): Payer: Medicare Other

## 2021-10-26 ENCOUNTER — Encounter (HOSPITAL_COMMUNITY): Payer: Self-pay

## 2021-10-26 DIAGNOSIS — R262 Difficulty in walking, not elsewhere classified: Secondary | ICD-10-CM

## 2021-10-26 DIAGNOSIS — M6281 Muscle weakness (generalized): Secondary | ICD-10-CM

## 2021-10-26 DIAGNOSIS — M25651 Stiffness of right hip, not elsewhere classified: Secondary | ICD-10-CM

## 2021-10-26 DIAGNOSIS — M25551 Pain in right hip: Secondary | ICD-10-CM

## 2021-10-29 ENCOUNTER — Encounter (HOSPITAL_COMMUNITY): Payer: Self-pay

## 2021-10-29 ENCOUNTER — Ambulatory Visit (HOSPITAL_COMMUNITY): Payer: Medicare Other

## 2021-10-29 DIAGNOSIS — M25651 Stiffness of right hip, not elsewhere classified: Secondary | ICD-10-CM

## 2021-10-29 DIAGNOSIS — R262 Difficulty in walking, not elsewhere classified: Secondary | ICD-10-CM

## 2021-10-29 DIAGNOSIS — M25551 Pain in right hip: Secondary | ICD-10-CM

## 2021-10-29 DIAGNOSIS — M6281 Muscle weakness (generalized): Secondary | ICD-10-CM

## 2021-11-02 ENCOUNTER — Encounter (HOSPITAL_COMMUNITY): Payer: Self-pay

## 2021-11-02 ENCOUNTER — Ambulatory Visit (HOSPITAL_COMMUNITY): Payer: Medicare Other

## 2021-11-02 DIAGNOSIS — R262 Difficulty in walking, not elsewhere classified: Secondary | ICD-10-CM | POA: Diagnosis not present

## 2021-11-02 DIAGNOSIS — M25651 Stiffness of right hip, not elsewhere classified: Secondary | ICD-10-CM

## 2021-11-02 DIAGNOSIS — M25551 Pain in right hip: Secondary | ICD-10-CM

## 2021-11-02 DIAGNOSIS — M6281 Muscle weakness (generalized): Secondary | ICD-10-CM

## 2021-11-02 NOTE — Therapy (Addendum)
OUTPATIENT PHYSICAL THERAPY LOWER EXTREMITY TREATMENT  Progress Note Reporting Period 10/03/21 to 11/02/21  See note below for Objective Data and Assessment of Progress/Goals.    Patient Name: Mark Cross MRN: [id number] DOB:05-Jul-1940, 81 y.o., male Today's Date: 11/02/2021   PT End of Session - 11/02/21 0836     Visit Number 10    Number of Visits 12    Date for PT Re-Evaluation 11/14/21    Authorization Type [employer] medicare    Progress Note Due on Visit 10    PT Start Time 0828    PT Stop Time 0915    PT Time Calculation (min) 47 min    Equipment Utilized During Treatment --   SBA   Activity Tolerance Patient tolerated treatment well    Behavior During Therapy WFL for tasks assessed/performed                 Past Medical History:  Diagnosis Date   DJD (degenerative joint disease), lumbosacral    Hyperlipidemia    On statin   Hypertension    Resistant; on 4 medications.;  Doppler evidence of RAS-but does not meet criteria for RA revascularization by CORAL Trial   Osteoarthritis of right hip    Bone-on-bone pain more severe pain (Dr. Edmonia Lynch)   PAD (peripheral artery disease) ([place]) 03/14/2021   ABIs to evaluate right foot pain: R ABI 0.67, TBI 0.34.  L ABI 1.12, TBI 0.52.  Indicates moderate RLE arterial disease.  No significant L LE disease.   RAS (renal artery stenosis) ([place]) 03/2021   Seen by Dr. Gwenlyn Saran from vascular surgery for bilateral RAS and SMA stenosis given poorly controlled hypertension.  Recommended to continue medical management as he did not meet criteria based on CORAL Trial.   Type 2 diabetes mellitus without complication ([place])    On metformin only.   Past Surgical History:  Procedure Laterality Date   INGUINAL HERNIA REPAIR Bilateral 08/10/2012   Procedure: HERNIA REPAIR INGUINAL ADULT;  Surgeon: Scherry Ran, MD;  Location: [facility];  Service: General;  Laterality: Bilateral;   RIGHT HEART CATH N/A 08/01/2021   Procedure: RIGHT  HEART CATH;  Surgeon: Leonie Man, MD;  Location: [place] [facility];  Service: Cardiovascular;  Laterality: N/A;   TOTAL HIP ARTHROPLASTY Right 10/02/2021   Procedure: TOTAL HIP ARTHROPLASTY ANTERIOR APPROACH;  Surgeon: Renette Butters, MD;  Location: [facility];  Service: Orthopedics;  Laterality: Right;   Patient Active Problem List   Diagnosis Date Noted   Pulmonary hypertension, unspecified ([place]) 08/01/2021   Hyperlipidemia associated with type 2 diabetes mellitus ([place]) 07/07/2021   Preop cardiovascular exam 07/06/2021   Bifascicular bundle branch block 07/06/2021   Resistant hypertension 42/39/5320   Systolic murmur 23/34/3568    PCP: Newt Minion   REFERRING PROVIDER: Renette Butters, MD   REFERRING DIAG: PT eval/tx for post op THR (DOS 10/02/2021)   THERAPY DIAG:  Difficulty in walking, muscle weakness   Rationale for Evaluation and Treatment Rehabilitation  ONSET DATE: 10/02/2021  SUBJECTIVE:   SUBJECTIVE STATEMENT:    Pt stated he has been walking with his QC inside and outside in yard for the past couple days.  Reports his hip is feeling good today.  Does c/o some LBP, stated it feels better when he stands upright, reports back gets stiff after sitting for 5-10 minutes.    Eval subjective: Pt states that he was having pain for two years with gradually worsening course since that  time. He had gotten to the point where he was walking with a walker. He opted to have a THR on 5/30.         PERTINENT HISTORY:  Right THR, anterior approach 10/02/21 DJD, Back pain, HTN, PAD, DM   PAIN:  Are you having pain? Yes: NPRS scale: 7/10 Pain location: LBP Pain description: Constant achey pain.   Aggravating factors: activity  Relieving factors: ice, meds   PRECAUTIONS: Anterior hip  WEIGHT BEARING RESTRICTIONS Yes /wbat  FALLS:  Has patient fallen in last 6 months? No  LIVING ENVIRONMENT: Lives with: lives with their family Lives in: House/apartment Stairs:  Yes: External: 3 steps; bilateral but cannot reach both Has following equipment at home: Single point cane and Walker - 2 wheeled  OCCUPATION: retired   PLOF: Independent, pt likes to go fishing, he would like to be able to have a garden next year as he normally has a garden but has not been able to to this for the past two years.    PATIENT GOALS To be able to walk without assistive device, To be able to move easier, to have no pain in his hip and to sleep better.     OBJECTIVE:   PATIENT SURVEYS:  FOTO 38  11/02/21: 13  COGNITION:  Overall cognitive status: Within functional limits for tasks assessed      EDEMA:  Noted lateral swelling    LOWER EXTREMITY ROM:  Active ROM Right eval Left eval Right 11/02/21  Hip flexion 45  95  Hip extension     Hip abduction     Hip adduction     Hip internal rotation     Hip external rotation     Knee flexion     Knee extension -10  Lacking 7  Ankle dorsiflexion     Ankle plantarflexion     Ankle inversion     Ankle eversion      (Blank rows = not tested)  LOWER EXTREMITY MMT:   MMT Right eval Left eval Right  11/02/21 Left  11/02/21  Hip flexion 3-  3+ 4/5  Hip extension   Prone 2+ Prone 3-  Hip abduction   3- 3/5  Hip adduction      Hip internal rotation      Hip external rotation      Knee flexion   3+ 3+  Knee extension 3+  4+ 4+  Ankle dorsiflexion 4  4+ 4/5  Ankle plantarflexion      Ankle inversion      Ankle eversion       (Blank rows = not tested) FUNCTIONAL TESTS:  30 seconds sit to stand: with UE assist  4x  2 minute walk test: PT ambulates with RW:  x 60 ft.   11/02/21: 2MWT with QC 130ft 5STS 18.73"  TODAY'S TREATMENT: 11/02/21: 2MWT Reciprocal pattern stairs ascend and descend 5RT 2 HR A Gait training to improve heel strike and equal stride length Reviewed goals MMT ROM measurement FOTO Standing: Squats with HHA front of chair cueing for mechanics 15x Sidestep with RTB around thigh 5RT inside  //bars Partial tandem stance 2x30"  10/29/21  6 inch step up anterior-> lateral x20 b/l  Slant board stretch x30 sec hold-> le pedal on board  Big steps with use of NBQC, cueing for sequence x 150' at CG/mina x2 with rest between [facility] with physio ball to 90 degrees hip flexion Physioball under knees LTR x20 Open books side lying with  pillow between knees Bridges with keeping end range slight hip flexion x20 Seated LAQ with 20# at ankle 2x10 b/l Stairs reciprocal with b/l UE support-> uni UE support x4 times over therpay stairs        10/26/21 Slant board stretch x30 sec hold-> le pedal on board with mina for hips forward 6 inch step up anterior-> lateral x 20 each, max cueing for sequence Big steps with use of NBQC, cueing for sequence x 150' at CG/mina Stairs reciprocal with b/l UE support-> uni UE support x4 times over therpay stairs Seated LAQ 20# x20 reps  Side steps in // bars with yellow band x6 laps                   6/19/203:                Standing:    rocker board x 3/                                 Gait training 267ft cueing for posture with cane working on step thru.                                 Slant board stretch x on minute x 2                                  Single leg stance Lt with 1 finger tough, Rt with 2 finger touch x 5 reps each    Heel raises 15x                                  Toe raises on incline slope with UE support x10                                  Forward Step up on 4" step                                   Lateral step up x 10                                  Squat x 10         Seated: STS x 10 reps eccentric control standard chair height                         10/19/21:   Standing: Gait training 28ft cueing for posture    Heel raises 15x incline slope with UE support    March alternating tapping opposite UE, required 1 HHA    Retro gait inside //bars 3RT with HHA    Sidestep 4RT with GTB around thigh 2RT with minimal to no HHA,  pt cautious without     Bodycraft TKE BLE 3PL 15x 5" each with RW for UE support    Seated: STS 2 x 5reps eccentric control standard chair height      10/15/21 Standing: Heel raises x 15 Hamstring stretch 12" step 2x30"  Slant board stretch 30" x 3  Forward step up x 10  both right and left                     Standing march alternating 2X10 with 1 UE assist Standing side toe tap for abduction 1x 10 bilateral green thera- band  Standing back toe tap for extension x 10 B green thera-band Body craft pulley machine TKE for Rt LE 3PL 2X10 using walker for UE support Seated: LAQ 10 x 5 seconds bilateral  STS with black foam in standard chair no UE 10X   10/12/21 Heel raises x 10 Rocker board x 2'    Slant board stretch 30" x 3  Forward step up x 10  both right and left                     Standing march alternating x 10  Standing hip abduction 1x 10 bilateral green thera- band  Standing extension x 10 B green thera-band LAQ 10 x 5 seconds bilateral  STS x5 with intermittent UE support   10/08/21 HR 2x 10  Lateral weight shifting x 20 bilateral  Standing march alternating 2x 10  Standing hip abduction 1x 10 bilateral  Seated hip abduction isometric 10 x 10 second holds with belt LAQ 10 x 5 seconds bilateral  STS 1x 3 with intermittent UE support and assist, 1x 3 with black foam on seat hands on thighs      PATIENT EDUCATION:  Education details: HEP [id number], eval  [id number] Person educated: Patient Education method: Explanation, Verbal cues, and Handouts Education comprehension: verbalized understanding   HOME EXERCISE PROGRAM:                        8/1:[id number]                        Standing heel raises, squats, sit to stand and bridges  Eval:Supine:  Ankle pumps, quad set, glut set, heel slides x 10 reps each.    11/02/21:  begin walking program   ASSESSMENT:  CLINICAL IMPRESSION: Reviewed goals with the following findings:  Pt has met 3/4 STG and 2/6 LTGs.  Pt  arrived ambulating with QC and reports ability to ambulate indoor and outdoors with no LOB or recent fall.  Reports ability to ambulate 5-10 minutes prior fatigue with LRAD.  Pt reports compliance with HEP daily.  Improved FOTO score indicating improved self perceived functional abilities.  Pt continues to demonstrate impaired hip strengthen and limited ROM with hip mobility and knee.  Pt will continue to benefit with PT to improve strength, mobility and balance to reduce risk of falls.  Encouraged pt to begin walking program to improve confidence with gait and activity tolerance.  OBJECTIVE IMPAIRMENTS Abnormal gait, decreased activity tolerance, decreased balance, decreased mobility, difficulty walking, decreased ROM, decreased strength, increased edema, increased fascial restrictions, and pain.   ACTIVITY LIMITATIONS carrying, lifting, bending, sitting, standing, squatting, sleeping, stairs, bed mobility, bathing, toileting, dressing, reach over head, locomotion level, and caring for others  PARTICIPATION LIMITATIONS: cleaning, driving, shopping, community activity, and yard work  PERSONAL FACTORS Age are also affecting patient's functional outcome.   REHAB POTENTIAL: Good  CLINICAL DECISION MAKING: Stable/uncomplicated  EVALUATION COMPLEXITY: Moderate   GOALS: Goals reviewed with patient? No  SHORT TERM GOALS: Target date: 10/24/2021   PT to be I in HEP Baseline: 11/02/21:  Reports compliance  with HEP Goal status: Achieved  2.  PT to be ambulating with a cane both in and outside  Baseline: 11/02/21:  Reports he has began walking with QC inside and outside with no reports of LOB or recent fall Goal status: Achieved  3.  PT to be able to walk for 15 minutes without increased pain. Baseline: 11/02/21:  Reports ability to walk for 4-5 minutes, Rt ankle has began to bother him while walking Goal status: Ongoing            4.   Pt pain level in Rt hip to be no greater than a  4/10   11/02/21:  Reports no pain in hip, just in back Goal status: Achieved  LONG TERM GOALS: Target date: 11/14/2021   PT to be I in advanced HEP to improve his strength of his Rt hip to at least 4+/5 to be able to walk for 30 minutes without assistive device or difficulty. Baseline:  Goal status: Ongoing  2.  PT to be able to ascend and descend 8 steps using a hand rail in a reciprocal mannner. Baseline: 11/02/21:  Able to demonstrate reciprocal manner ascending and descending 5RT 7in step height with 2 HR Goal status: Partly met  3.  PT to be able to come sit to stand without UE assist 5 x in less than 20 seconds  Baseline: 11/02/21: 5STS 18.73" no HHA Goal status: Achieved  4.  PT to be sleeping 6 hours at a time Baseline: 11/02/21:  Reports ability sleep good last night, stated he usually wakes up 3 times a night but not due to pain.  Average 4 hours prior waking.   Goal status: Ongoing  5.  Pt hip ROM to be improved to 110 to allow pt to squat down to pick items out of his garden            Goal status : Ongoing              6.  Pt pain level of Rt hip to be no greater than a 1/10    11/02/21:  No reports of hip pain.              Goal status: Achieved   PLAN: PT FREQUENCY: 2x/week  PT DURATION: 6 weeks  PLANNED INTERVENTIONS: Therapeutic exercises, Therapeutic activity, Balance training, Gait training, Patient/Family education, and Manual therapy  PLAN FOR NEXT SESSION: Continue to progress  WB activity to include standing  Gait training with cane .  Balance activities as appropriate.  Ihor Austin, LPTA/CLT; [place] Rayetta Humphrey, [place] CLT [phone]

## 2021-11-05 ENCOUNTER — Encounter (HOSPITAL_COMMUNITY): Payer: Self-pay | Admitting: Physical Therapy

## 2021-11-05 ENCOUNTER — Ambulatory Visit (HOSPITAL_COMMUNITY): Payer: Medicare Other | Attending: Orthopedic Surgery | Admitting: Physical Therapy

## 2021-11-05 DIAGNOSIS — M25551 Pain in right hip: Secondary | ICD-10-CM | POA: Insufficient documentation

## 2021-11-05 DIAGNOSIS — R262 Difficulty in walking, not elsewhere classified: Secondary | ICD-10-CM | POA: Insufficient documentation

## 2021-11-05 DIAGNOSIS — M6281 Muscle weakness (generalized): Secondary | ICD-10-CM | POA: Insufficient documentation

## 2021-11-05 DIAGNOSIS — M25651 Stiffness of right hip, not elsewhere classified: Secondary | ICD-10-CM | POA: Diagnosis present

## 2021-11-05 NOTE — Therapy (Signed)
OUTPATIENT PHYSICAL THERAPY LOWER EXTREMITY TREATMENT    Patient Name: Mark Cross MRN: [id number] DOB:[birth year], 81 y.o., male Today's Date: 11/05/2021   PT End of Session - 11/05/21 0857     Visit Number 11    Number of Visits 12    Date for PT Re-Evaluation 11/14/21    Authorization Type [employer] medicare    Progress Note Due on Visit 20    PT Start Time 0900    PT Stop Time 0938    PT Time Calculation (min) 38 min    Activity Tolerance Patient tolerated treatment well    Behavior During Therapy WFL for tasks assessed/performed                 Past Medical History:  Diagnosis Date   DJD (degenerative joint disease), lumbosacral    Hyperlipidemia    On statin   Hypertension    Resistant; on 4 medications.;  Doppler evidence of RAS-but does not meet criteria for RA revascularization by CORAL Trial   Osteoarthritis of right hip    Bone-on-bone pain more severe pain (Dr. Edmonia Lynch)   PAD (peripheral artery disease) ([place]) 03/14/2021   ABIs to evaluate right foot pain: R ABI 0.67, TBI 0.34.  L ABI 1.12, TBI 0.52.  Indicates moderate RLE arterial disease.  No significant L LE disease.   RAS (renal artery stenosis) ([place]) 03/2021   Seen by Dr. Gwenlyn Saran from vascular surgery for bilateral RAS and SMA stenosis given poorly controlled hypertension.  Recommended to continue medical management as he did not meet criteria based on CORAL Trial.   Type 2 diabetes mellitus without complication ([place])    On metformin only.   Past Surgical History:  Procedure Laterality Date   INGUINAL HERNIA REPAIR Bilateral 08/10/2012   Procedure: HERNIA REPAIR INGUINAL ADULT;  Surgeon: Scherry Ran, MD;  Location: [facility];  Service: General;  Laterality: Bilateral;   RIGHT HEART CATH N/A 08/01/2021   Procedure: RIGHT HEART CATH;  Surgeon: Leonie Man, MD;  Location: [facility];  Service: Cardiovascular;  Laterality: N/A;   TOTAL HIP ARTHROPLASTY Right 10/02/2021    Procedure: TOTAL HIP ARTHROPLASTY ANTERIOR APPROACH;  Surgeon: Renette Butters, MD;  Location: [facility];  Service: Orthopedics;  Laterality: Right;   Patient Active Problem List   Diagnosis Date Noted   Pulmonary hypertension, unspecified ([place]) 08/01/2021   Hyperlipidemia associated with type 2 diabetes mellitus ([place]) 07/07/2021   Preop cardiovascular exam 07/06/2021   Bifascicular bundle branch block 07/06/2021   Resistant hypertension 58/01/9832   Systolic murmur 82/50/5397    PCP: Newt Minion   REFERRING PROVIDER: Renette Butters, MD   REFERRING DIAG: PT eval/tx for post op THR (DOS 10/02/2021)   THERAPY DIAG:  Difficulty in walking, muscle weakness   Rationale for Evaluation and Treatment Rehabilitation  ONSET DATE: 10/02/2021  SUBJECTIVE:   SUBJECTIVE STATEMENT:    Hip has been doing good. He has a hernia that given him more trouble.    PERTINENT HISTORY:  Right THR, anterior approach 10/02/21 DJD, Back pain, HTN, PAD, DM   PAIN:  Are you having pain? Yes: NPRS scale: 5/10 Pain location: LBP Pain description: Constant achey pain.   Aggravating factors: activity  Relieving factors: ice, meds   PRECAUTIONS: Anterior hip  WEIGHT BEARING RESTRICTIONS Yes /wbat  FALLS:  Has patient fallen in last 6 months? No  LIVING ENVIRONMENT: Lives with: lives with their family Lives in: House/apartment Stairs: Yes: External: 3  steps; bilateral but cannot reach both Has following equipment at home: Single point cane and Walker - 2 wheeled  OCCUPATION: retired   PLOF: Independent, pt likes to go fishing, he would like to be able to have a garden next year as he normally has a garden but has not been able to to this for the past two years.    PATIENT GOALS To be able to walk without assistive device, To be able to move easier, to have no pain in his hip and to sleep better.     OBJECTIVE:   PATIENT SURVEYS:  FOTO 38  11/02/21: 74  COGNITION:  Overall cognitive  status: Within functional limits for tasks assessed      EDEMA:  Noted lateral swelling    LOWER EXTREMITY ROM:  Active ROM Right eval Left eval Right 11/02/21  Hip flexion 45  95  Hip extension     Hip abduction     Hip adduction     Hip internal rotation     Hip external rotation     Knee flexion     Knee extension -10  Lacking 7  Ankle dorsiflexion     Ankle plantarflexion     Ankle inversion     Ankle eversion      (Blank rows = not tested)  LOWER EXTREMITY MMT:   MMT Right eval Left eval Right  11/02/21 Left  11/02/21  Hip flexion 3-  3+ 4/5  Hip extension   Prone 2+ Prone 3-  Hip abduction   3- 3/5  Hip adduction      Hip internal rotation      Hip external rotation      Knee flexion   3+ 3+  Knee extension 3+  4+ 4+  Ankle dorsiflexion 4  4+ 4/5  Ankle plantarflexion      Ankle inversion      Ankle eversion       (Blank rows = not tested) FUNCTIONAL TESTS:  30 seconds sit to stand: with UE assist  4x  2 minute walk test: PT ambulates with RW:  x 60 ft.   11/02/21: 2MWT with QC 177f 5STS 18.73"  TODAY'S TREATMENT: 11/05/21 HR 1x10  Alternating march 2x 10 bilateral Step up 6 inch 2x 10 bilateral  Lateral step up 6 inch 2x 10 bilateral STS 2x 10  Partial tandem stance 3x 30 second holds bilateral   11/02/21: 2MWT Reciprocal pattern stairs ascend and descend 5RT 2 HR A Gait training to improve heel strike and equal stride length Reviewed goals MMT ROM measurement FOTO Standing: Squats with HHA front of chair cueing for mechanics 15x Sidestep with RTB around thigh 5RT inside //bars Partial tandem stance 2x30"  10/29/21  6 inch step up anterior-> lateral x20 b/l  Slant board stretch x30 sec hold-> le pedal on board  Big steps with use of NBQC, cueing for sequence x 150' at CG/mina x2 with rest [facility] to 90 degrees hip flexion Physioball under knees LTR x20 Open books side lying with pillow between knees Bridges with  keeping end range slight hip flexion x20 Seated LAQ with 20# at ankle 2x10 b/l Stairs reciprocal with b/l UE support-> uni UE support x4 times over therpay stairs   10/26/21 Slant board stretch x30 sec hold-> le pedal on board with mina for hips forward 6 inch step up anterior-> lateral x 20 each, max cueing for sequence Big steps with use of NBQC, cueing for  sequence x 150' at CG/mina Stairs reciprocal with b/l UE support-> uni UE support x4 times over therpay stairs Seated LAQ 20# x20 reps  Side steps in // bars with yellow band x6 laps      PATIENT EDUCATION:  Education details: HEP [id number], eval  [id number] Person educated: Patient Education method: Explanation, Verbal cues, and Handouts Education comprehension: verbalized understanding   HOME EXERCISE PROGRAM:                        3/0:[id number]                        Standing heel raises, squats, sit to stand and bridges  Eval:Supine:  Ankle pumps, quad set, glut set, heel slides x 10 reps each.    11/02/21:  begin walking program   ASSESSMENT:  CLINICAL IMPRESSION: Continued with standing strengthening and balance training which are tolerated well. He requires HHA with most exercises completed due to weakness. Patient given intermittent cueing for mechanics/sequencing with fair carry over. Patient will continue to benefit from physical therapy in order to improve function and reduce impairment.   OBJECTIVE IMPAIRMENTS Abnormal gait, decreased activity tolerance, decreased balance, decreased mobility, difficulty walking, decreased ROM, decreased strength, increased edema, increased fascial restrictions, and pain.   ACTIVITY LIMITATIONS carrying, lifting, bending, sitting, standing, squatting, sleeping, stairs, bed mobility, bathing, toileting, dressing, reach over head, locomotion level, and caring for others  PARTICIPATION LIMITATIONS: cleaning, driving, shopping, community activity, and yard work  PERSONAL FACTORS Age  are also affecting patient's functional outcome.   REHAB POTENTIAL: Good  CLINICAL DECISION MAKING: Stable/uncomplicated  EVALUATION COMPLEXITY: Moderate   GOALS: Goals reviewed with patient? No  SHORT TERM GOALS: Target date: 10/24/2021   PT to be I in HEP Baseline: 11/02/21:  Reports compliance with HEP Goal status: Achieved  2.  PT to be ambulating with a cane both in and outside  Baseline: 11/02/21:  Reports he has began walking with QC inside and outside with no reports of LOB or recent fall Goal status: Achieved  3.  PT to be able to walk for 15 minutes without increased pain. Baseline: 11/02/21:  Reports ability to walk for 4-5 minutes, Rt ankle has began to bother him while walking Goal status: Ongoing            4.   Pt pain level in Rt hip to be no greater than a 4/10   11/02/21:  Reports no pain in hip, just in back Goal status: Achieved  LONG TERM GOALS: Target date: 11/14/2021   PT to be I in advanced HEP to improve his strength of his Rt hip to at least 4+/5 to be able to walk for 30 minutes without assistive device or difficulty. Baseline:  Goal status: Ongoing  2.  PT to be able to ascend and descend 8 steps using a hand rail in a reciprocal mannner. Baseline: 11/02/21:  Able to demonstrate reciprocal manner ascending and descending 5RT 7in step height with 2 HR Goal status: Partly met  3.  PT to be able to come sit to stand without UE assist 5 x in less than 20 seconds  Baseline: 11/02/21: 5STS 18.73" no HHA Goal status: Achieved  4.  PT to be sleeping 6 hours at a time Baseline: 11/02/21:  Reports ability sleep good last night, stated he usually wakes up 3 times a night but not due to pain.  Average 4 hours  prior waking.   Goal status: Ongoing  5.  Pt hip ROM to be improved to 110 to allow pt to squat down to pick items out of his garden            Goal status : Ongoing              6.  Pt pain level of Rt hip to be no greater than a 1/10    11/02/21:  No  reports of hip pain.              Goal status: Achieved   PLAN: PT FREQUENCY: 2x/week  PT DURATION: 6 weeks  PLANNED INTERVENTIONS: Therapeutic exercises, Therapeutic activity, Balance training, Gait training, Patient/Family education, and Manual therapy  PLAN FOR NEXT SESSION: Continue to progress  WB activity to include standing  Gait training with cane .  Balance activities as appropriate.  8:58 AM, 11/05/21 Mearl Latin PT, DPT Physical Therapist at [facility]

## 2021-11-08 ENCOUNTER — Other Ambulatory Visit: Payer: Self-pay | Admitting: Nurse Practitioner

## 2021-11-08 ENCOUNTER — Ambulatory Visit (HOSPITAL_COMMUNITY): Payer: Medicare Other | Admitting: Physical Therapy

## 2021-11-09 ENCOUNTER — Telehealth: Payer: Self-pay | Admitting: Cardiology

## 2021-11-09 ENCOUNTER — Encounter: Payer: Self-pay | Admitting: Cardiology

## 2021-11-09 ENCOUNTER — Encounter (HOSPITAL_COMMUNITY): Payer: Medicare Other

## 2021-11-09 ENCOUNTER — Ambulatory Visit: Payer: Medicare Other | Admitting: Cardiology

## 2021-11-09 VITALS — BP 179/82 | HR 59 | Ht 68.0 in | Wt 128.0 lb

## 2021-11-09 DIAGNOSIS — I10 Essential (primary) hypertension: Secondary | ICD-10-CM | POA: Diagnosis not present

## 2021-11-09 DIAGNOSIS — I272 Pulmonary hypertension, unspecified: Secondary | ICD-10-CM | POA: Diagnosis not present

## 2021-11-09 DIAGNOSIS — I452 Bifascicular block: Secondary | ICD-10-CM

## 2021-11-09 DIAGNOSIS — R011 Cardiac murmur, unspecified: Secondary | ICD-10-CM

## 2021-11-09 DIAGNOSIS — E785 Hyperlipidemia, unspecified: Secondary | ICD-10-CM

## 2021-11-09 DIAGNOSIS — E1169 Type 2 diabetes mellitus with other specified complication: Secondary | ICD-10-CM | POA: Diagnosis not present

## 2021-11-09 DIAGNOSIS — I5032 Chronic diastolic (congestive) heart failure: Secondary | ICD-10-CM

## 2021-11-09 DIAGNOSIS — I110 Hypertensive heart disease with heart failure: Secondary | ICD-10-CM | POA: Insufficient documentation

## 2021-11-09 MED ORDER — AMLODIPINE BESYLATE 10 MG PO TABS: 10.0000 mg | ORAL_TABLET | Freq: Every day | ORAL | 3 refills | Status: DC

## 2021-11-09 NOTE — Progress Notes (Signed)
Primary Care Provider: Leslie Andrea, MD Cardiologist: Mark Hew, MD Electrophysiologist: None  Clinic Note: Chief Complaint  Patient presents with   Hospitalization Follow-up    Follow-up after hip surgery.   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  Blood pressure is better.   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  I do not think there is hypertrophic heart disease related to amyloid or other etiologies based on how significantly hypertensive he was upon initial evaluation.  Mainstay of treatment is antihypertensive titration with mild diuresis.  Adjust medications for resistant hypertension section.      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  With his advanced age it is quite possible that there is multifactor's.  The one notable prominent finding was how much his PA pressures went up when the SBP increased as noted.  Need aggressive essential hypertension management.  We will space out his blood pressure medications as indicated to allow for full 3 times a day coverage with each dose of hydralazine. Converting from verapamil to amlodipine hopefully also for antianginal benefit and to allow for more heart rate responsiveness.      Relevant Orders   ECHOCARDIOGRAM COMPLETE   Hyperlipidemia associated with type 2 diabetes mellitus ([place]) (Chronic)    On simvastatin.  Should be due for labs to be checked.  Hopefully these will be checked by PCP. Continue simvastatin for now => would try to target LDL less than 70 if  possible.  Although given his advanced age we do not need to be overly aggressive. He has diabetes listed as a diagnosis and is only taking metformin.  Would potentially consider SGLT2 inhibitor to help with mild diuresis & Hypertensive Heart Disease/. HFpEF      Bifascicular bundle branch block (Chronic)    This is not a new finding for him, it goes along with his pretty significant echocardiogram findings.  Everything seems to be relatively stable.  No signs of chronotropic incompetence but he is on to avoid all agents.  Plan: Wean off verapamil and convert to amlodipine. Next recommendation will be to wean off of atenolol and switch to carvedilol.      Relevant Orders   ECHOCARDIOGRAM COMPLETE   Resistant hypertension - Primary (Chronic)    Unfortunately, he and his wife did not bring his med list or medications.  So we would hypertension or what he was actually taking during the visit.  His blood pressures are very high today even the pressure he is recording at home are still too high.  When his blood pressures are high, his pulmonary pressures are high indicating WHO class II symptoms.  Not exactly sure what to make of his blood pressure going higher with switching to chlorthalidone 50 mg from HCTZ? Need to clarify if he is indeed taking 100 mg TID hydralazine, and is he taking furosemide as well as HCTZ.   Updated plan -> for now, will convert from verapamil to amlodipine (bifascicular block with bradycardia in 2 AV nodal agents -I would prefer to be on just a nonselective beta-blocker)  Wean off verapamil and convert to  amlodipine: Take every other day verapamil/amlodipine for 2 weeks and then stop verapamil continuing with amlodipine nightly (along with third/p.m. dose of 100 mg hydralazine) Otherwise continue other medications Take both lisinopril and HCTZ and 100 mg hydralazine in the morning Take atenolol 100 mg along with hydralazine 100 mg in the middle of the day  He will  follow-up with our [facility] clinical pharmacist for further titration of medications-will be to potentially convert from atenolol to carvedilol (better blood pressure control and less half-life). There is still room for medications such as spironolactone, nitrate.  Would do my best to avoid clonidine.        Relevant Orders   ECHOCARDIOGRAM COMPLETE     Other   Systolic murmur    Systolic murmur is actually combination of mitral regurgitation, tricuspid regurgitation and aortic sclerosis.  Most concerning of these is the MR.  With improved blood pressure, lack of symptoms, and medication adjustments, would like to reassess 2D echo in roughly 6 months and then see him back in follow-up.      ===================================  HPI:    Mark Cross is a 81 y.o. 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  I saw Mark Cross preop consultation on July 06, 2021 because of significant hypertension and abnormal EKG.  He was also noted to have a murmur.  I therefore ordered a 2D echo which was grossly abnormal as reviewed below-severe concentric LVH, hyperdynamic with severely elevated PAP.  => He was seen in follow-up by Mark Browner MD on March 23 after the echocardiogram and was scheduled for right heart catheterization (08/01/2021) +> based on these results, he was started on furosemide due to likely diastolic dysfunction related elevated PAP.  DDerrienwas then subsequently seen on 08/23/2021 by Mark Browner NP for final preop evaluation prior to his relatively urgent hip surgery.  He noted he was doing well for cardiac standpoint.  No complaints of  dyspnea, edema PND orthopnea.  No angina.  BP has been better well controlled at home with a range of 140 to 150 mmHg (which apparently is his historical baseline).  BP was elevated in the office shortly after walking in from the car, and was in significant pain.  Plan was to convert from 50 mg HCTZ to 25 mg chlorthalidone with plans for future change from verapamil to amlodipine and atenolol to carvedilol. With no active symptoms, was cleared to proceed with his hip surgery and follow-up after.  Recent Hospitalizations:  10/02/2021: Total right hip arthroplasty (Dr. MJolene Provost.  Initiated PT the following day.  Discharged same day  Reviewed  CV studies:    The following studies were reviewed today: (if available, images/films reviewed: From Epic Chart or Care Everywhere)  TTE3/01/2022: Hyperdynamic LV.  No RWMA.  EF 65-70%.  Severe concentric LVH with GRII DD.  No LVOT obstruction or SAM.  Severe Left Atrial and moderate Right Atrial dilation (mildly elevated RAP estimated 8 mm).  Mildly enlarged RV with mildly increased thickness.  Severely elevated PAP/RVSP ~ 70-71 mmHg.  Moderate MAC.  Moderate MR.  Mild to moderate TR.  AOV sclerosis with no stenosis.  Small circumferential pericardial effusion Conclusion(s)/Recommendation(s): Findings consistent with hypertrophic cardiomyopathy. No evidence of  LVOT obstruction. Findings may be due to untreated hypertension, but consider evaluation for amyloidosis or genetic HCM if HTN is not present.  [place] 08/01/2021: Severe systemic hypertension-SBP range 210-220 mmHg. Moderate to Severe Pulmonary Hypertension:  PAP 51/15 mmHg-mean 32 mmHg.  (Average PAP 60/18 mmHg-mean 38 mmHg)  (With increase in systolic pressures from 086 to 224 mmHg, PA pressures increased up to 65 to 66 mmHg systolic.   PCWP 24-28 mmHg.   Mean RAP 4 mmHg, RVP-EDP 60/0-9 mmHg.   Cardiac Output Index: 7-4.05 by thermal dilution, Fick 5.81-3.37.  Interval History:   CULLY LUCKOW  returns here today with his wife doing quite well.  He really has not had any complaints whatsoever.  Feels much better having his hip operated on.  He still walk with a cane because of balance issues.  He says his blood pressures are going up and down.  He says at home the most recent records have been somewhere between 144-149 /  60 to 66 mmHg.  Pretty much this has been his baseline, he does not necessarily member getting any lower than in the past. He tells me that he is not taking chlorthalidone, indicating that when he started taking that, his blood pressure went skyhigh.   He just went back to taking his HCTZ.  Unfortunately, he did not bring his boot this to me today so was very difficult to determine appropriate treatment.  We did have him call back in and talk with my nurse to go to the meds. Ms. Burbano actually seems to be doing quite well with no complaints of shortness of breath at rest or exertion.  No PND, orthopnea or edema.  He said that the Lasix really did help with some of his shortness of breath and blood pressure.  I insinuated that he [place] furosemide/Lasix because they called it "the fluid pill starting with L."  He is not overly active, but doing better with PT every day.  He is not having any problems with chest pain, pressure or dyspnea with rest or exertion.  He may have a little balance issues, but is not having any syncope or near syncope.  No headaches or blurred vision, lightheadedness or headache.  No TIA or amaurosis fugax symptoms.  No rapid or irregular heartbeat/palpitations   REVIEWED OF SYSTEMS   Review of Systems  Constitutional:  Negative for malaise/fatigue (Does not have a whole lot of energy, but feels okay.) and weight loss.  HENT:  Negative for congestion and sinus pain.   Respiratory:  Negative for cough and shortness of breath.   Cardiovascular: Negative.   Gastrointestinal:  Negative for abdominal pain, blood in stool, constipation, heartburn and melena.   Genitourinary:  Negative for hematuria.  Musculoskeletal:  Positive for joint pain.  Neurological:  Negative for dizziness and focal weakness.  Psychiatric/Behavioral:  Positive for memory loss. Negative for depression. The patient is not nervous/anxious and does not have insomnia.     I have reviewed and (if needed) personally updated the patient's problem list, medications, allergies, past medical and surgical history, social and family history.   PAST MEDICAL HISTORY   Past Medical History:  Diagnosis Date   DJD (degenerative joint disease), lumbosacral    Hyperlipidemia    On statin   Hypertension    Resistant; on 4 medications.;  Doppler evidence of RAS-but does not meet criteria for RA revascularization by CORAL Trial   Osteoarthritis of right hip    Bone-on-bone pain more severe pain (  Dr. Edmonia Lynch)   PAD (peripheral artery disease) ([place]) 03/14/2021   ABIs to evaluate right foot pain: R ABI 0.67, TBI 0.34.  L ABI 1.12, TBI 0.52.  Indicates moderate RLE arterial disease.  No significant L LE disease.   RAS (renal artery stenosis) ([place]) 03/2021   Seen by Dr. Gwenlyn Saran from vascular surgery for bilateral RAS and SMA stenosis given poorly controlled hypertension.  Recommended to continue medical management as he did not meet criteria based on CORAL Trial.   Type 2 diabetes mellitus without complication ([place])    On metformin only.    PAST SURGICAL HISTORY   Past Surgical History:  Procedure Laterality Date   INGUINAL HERNIA REPAIR Bilateral 08/10/2012   Procedure: HERNIA REPAIR INGUINAL ADULT;  Surgeon: Scherry Ran, MD;  Location: [facility];  Service: General;  Laterality: Bilateral;   RIGHT HEART CATH N/A 08/01/2021   Procedure: RIGHT HEART CATH;  Surgeon: Leonie Man, MD;  Location: [facility] INVASIVE CV LAB:: Severe systemic HTN-SBP range 210-220 mmHg. Mod-Severe Pulm HTN: PAP-mean 51/15 mmHg -32 mmHg. (Avg PAP 60/18 mmHg-mean 38 mmHg), (When SBP increased 200->  224, PAP increased to 66 mmHg), PCWP 24-28 mmHg; Mean RAP 4 mmHg, RVP-EDP 60/0-9 mmHg.  Cardiac Output Index: 7-4.05 by TD, Fick 5.81-3.37.   TOTAL HIP ARTHROPLASTY Right 10/02/2021   Procedure: TOTAL HIP ARTHROPLASTY ANTERIOR APPROACH;  Surgeon: Renette Butters, MD;  Location: [facility];  Service: Orthopedics;  Laterality: Right;     There is no immunization history on file for this patient.  MEDICATIONS/ALLERGIES   Current Meds  Medication Sig   acetaminophen (TYLENOL) 500 MG tablet Take 2 tablets (1,000 mg total) by mouth every 6 (six) hours as needed for mild pain or moderate pain.   atenolol (TENORMIN) 100 MG tablet Take 100 mg by mouth in the morning.   chlorthalidone (HYGROTON) 25 MG tablet TAKE 1 TABLET BY MOUTH DAILY => not taking-> went back to taking HCTZ 50 mg daily   ferrous sulfate 325 (65 FE) MG tablet Take 325 mg by mouth in the morning.   hydrALAZINE (APRESOLINE) 100 MG tablet Take 100 mg by mouth 3 (three) times daily.   lisinopril (ZESTRIL) 40 MG tablet Take 40 mg by mouth in the morning.   metFORMIN (GLUCOPHAGE) 1000 MG tablet Take 1,000 mg by mouth 2 (two) times daily with a meal.   simvastatin (ZOCOR) 40 MG tablet Take 40 mg by mouth in the morning.   verapamil (VERELAN PM) 360 MG 24 hr capsule Take 360 mg by mouth at bedtime.   Furosemide 20 mg Take 1 tablet by mouth in the morning daily.   vitamin B-12 (CYANOCOBALAMIN) 1000 MCG tablet Take 1,000 mcg by mouth in the morning.   Clarification from the patient following clinic visit:  Not taking chlorthalidone, taking HCTZ 50 mg daily (citing increase in blood pressure when the changes made) Taking furosemide 20 mg daily  No Known Allergies  SOCIAL HISTORY/FAMILY HISTORY   Reviewed in Epic:  Pertinent findings:  Social History   Tobacco Use   Smoking status: Former   Smokeless tobacco: Never  Scientific laboratory technician Use: Never used  Substance Use Topics   Alcohol use: Not Currently    Comment: seldom   Drug  use: No   Social History   Social History Narrative   Not on file    OBJCTIVE -PE, EKG, labs   Wt Readings from Last 3 Encounters:  11/09/21 128 lb (58.1 kg)  09/26/21 129 lb (58.5 kg)  08/31/21 130 lb 12.8 oz (59.3 kg)    Physical Exam: BP (!) 179/82   Pulse (!) 59   Ht _0  (1.727 m)   Wt 128 lb (58.1 kg)   SpO2 99%   BMI 19.46 kg/m  Physical Exam Constitutional:      General: He is not in acute distress.    Appearance: He is ill-appearing (Healthy-appearing albeit somewhat thin and frail.  Seems much less uncomfortable than last visit.  Able to sit). He is not toxic-appearing.  HENT:     Head: Normocephalic and atraumatic.  Neck:     Vascular: Carotid bruit (Potentially radiated AOV murmur.) present. No hepatojugular reflux or JVD (No notable IJ or EJ pulsation.).  Cardiovascular:     Pulses: Decreased pulses.          Carotid pulses are  on the right side with bruit and  on the left side with bruit.      Femoral pulses are 2+ on the right side with bruit and 2+ on the left side with bruit.      Popliteal pulses are 1+ on the right side and 2+ on the left side.       Dorsalis pedis pulses are 1+ on the right side and 2+ on the left side.       Posterior tibial pulses are 1+ on the right side and 2+ on the left side.     Heart sounds: Murmur heard.     High-pitched harsh crescendo-decrescendo midsystolic murmur is present with a grade of 2/6 at the upper right sternal border radiating to the neck.     High-pitched blowing holosystolic murmur of grade 3/6 is also present at the lower right sternal border and apex radiating to the apex. There is also some radiation to the right chest  Pulmonary:     Effort: Pulmonary effort is normal. No respiratory distress.     Breath sounds: Normal breath sounds. No wheezing, rhonchi or rales.  Abdominal:     General: Abdomen is flat. Bowel sounds are normal. There is no distension.     Palpations: There is no mass.     Tenderness:  There is no abdominal tenderness.     Hernia: No hernia is present.  Musculoskeletal:        General: No swelling.     Cervical back: Normal range of motion and neck supple.  Skin:    General: Skin is warm and dry.  Neurological:     General: No focal deficit present.     Mental Status: He is alert and oriented to person, place, and time.     Cranial Nerves: No cranial nerve deficit.     Gait: Gait abnormal (Still slow much more steady but still unsteady gait => uses 4-prong cane).  Psychiatric:        Mood and Affect: Mood normal.        Behavior: Behavior normal.        Thought Content: Thought content normal.        Judgment: Judgment normal.      Adult ECG Report N/a  Recent Labs: Reviewed. No results found for: "CHOL", "HDL", "[place]", "LDLDIRECT", "TRIG", "CHOLHDL" Lab Results  Component Value Date   CREATININE 1.18 09/26/2021   BUN 38 (H) 09/26/2021   NA 142 09/26/2021   K 4.0 09/26/2021   CL 105 09/26/2021   CO2 26 09/26/2021      Latest Ref Rng &  Units 09/26/2021   11:53 AM 08/01/2021    1:54 PM 07/26/2021    2:28 PM  CBC  WBC 4.0 - 10.5 K/uL 10.5   10.8   Hemoglobin 13.0 - 17.0 g/dL 12.4  12.2    12.2  12.1   Hematocrit 39.0 - 52.0 % 39.5  36.0    36.0  36.4   Platelets 150 - 400 K/uL 262   250     Lab Results  Component Value Date   HGBA1C 6.5 (H) 09/26/2021   No results found for: "TSH"  ================================================== I spent a total of 30 minutes with the patient spent in direct patient consultation.  Additional time spent with chart review  / charting (studies, outside notes, etc): 56 min -> Additional time was spent after the patient's wife called in with med list reviewing current meds and making a plan going forward. Total Time: 86 min  Current medicines are reviewed at length with the patient today.  (+/- concerns) N/A  Notice: This dictation was prepared with Dragon dictation along with smart phrase technology. Any  transcriptional errors that result from this process are unintentional and may not be corrected upon review.  Studies Ordered:   Orders Placed This Encounter  Procedures   ECHOCARDIOGRAM COMPLETE   No orders of the defined types were placed in this encounter.  ADDENDUM => Called in by patient's wife via nursing line: Spoke to patient's wife. She is calling back with  the name of  patient's medication list Furosemide 20 mg daily  Verapamil 360 mg at bedtime  Lisinopril 40 mg  daily  Hydrochlorothiazide 50 mg daily Hydralazine 100 mg   3 times a day Atenolol 100 mg daily Vitamin B-12 100 mcg daily Metformin 100 mg  twice a day Iron tablet 325 mg daily, Tylenol as needed      Wife reports that patient is not taking Chlorthalidone  25 mg at present time. Per wife,states his blood pressure continue to be elevated. Patient  stopped medication an restarted the hydrochlorothiazide 50 mg  daily.   Patient Instructions / Medication Changes & Studies & Tests Ordered   Patient Instructions  Medication Instructions:   Please call office to let us know what medication you are taking  *If you need a refill on your cardiac medications before your next appointment, please call your pharmacy*   Lab Work: Not needed    Testing/Procedures: Will be schedule at [facility] [address] Jan 2024 Your physician has requested that you have an echocardiogram. Echocardiography is a painless test that uses sound waves to create images of your heart. It provides your doctor with information about the size and shape of your heart and how well your heart's chambers and valves are working. This procedure takes approximately one hour. There are no restrictions for this procedure.    Follow-Up: At [facility], you and your health needs are our priority.  As part of our continuing mission to provide you with exceptional heart care, we have created designated Provider Care Teams.  These  Care Teams include your primary Cardiologist (physician) and Advanced Practice Providers (APPs -  Physician Assistants and Nurse Practitioners) who all work together to provide you with the care you need, when you need it.     Your next appointment:   6 week(s)  The format for your next appointment:   In Person  Provider:   Your physician recommends that you schedule a follow-up appointment in:  with [facility] -  help medication management - ( not sure what patient is taking)    Your physician recommends that you schedule a follow-up appointment in: Jan 2024 with Dr Ellyn Hack    Your physician has requested that you have an echocardiogram. Echocardiography is a painless test that uses sound waves to create images of your heart. It provides your doctor with information about the size and shape of your heart and how well your heart's chambers and valves are working. This procedure takes approximately one hour. There are no restrictions for this procedure.    ADDENDUM After medications called in, medication adjustments made Here are the instruction we discussed on the phone.   Morning medication please take Lisinopril  40 mg  Hydrochlorothiazide 50 mg  Hydralazine 100 mg      Lunch medication please take ( middle of the day) Atenolol 100 mg Hydralazine 100 mg      Bedtime medication please take  Verapamil 360 mg or Amlodipine 10 mg Hydralazine 100 mg      Continue taking all the other medication at their regular time.      Weaning  off the verapamil 360 mg take every other day at bedtime  for 2 weeks then stop after the 2 weeks is completed. On the other days at bedtime for 2 weeks take Amlodipine 10 mg . After 2 weeks , start taking Amlodipine 10 mg daily at bedtime.      The new medication was sent the [place]  Please bring  your medication to your appointment on 12/21/21 with pharmacist-[facility]     Any question can contact  office. Ivin Booty RN After medications called  in, medication adjustments made Here are the instruction we discussed on the phone.   Morning medication please take Lisinopril  40 mg  Hydrochlorothiazide 50 mg  Hydralazine 100 mg      Lunch medication please take ( middle of the day) Atenolol 100 mg Hydralazine 100 mg      Bedtime medication please take  Verapamil 360 mg or Amlodipine 10 mg Hydralazine 100 mg      Continue taking all the other medication at their regular time.      Weaning  off the verapamil 360 mg take every other day at bedtime  for 2 weeks then stop after the 2 weeks is completed. On the other days at bedtime for 2 weeks take Amlodipine 10 mg . After 2 weeks , start taking Amlodipine 10 mg daily at bedtime.      The new medication was sent the [place]  Please bring  your medication to your appointment on 12/21/21 with pharmacist-[facility]     Any question can contact  office. Ivin Booty RN    Mark Cross, M.D., M.S. Interventional Cardiologist   Pager # [phone] Phone # [phone] [address]. [place], [place] [address]   Thank you for choosing Heart[facility] at [facility]!!

## 2021-11-09 NOTE — Telephone Encounter (Signed)
Confirmed plan.  Updated clinic note.  Bryan Lemma, MD

## 2021-11-09 NOTE — Assessment & Plan Note (Signed)
Systolic murmur is actually combination of mitral regurgitation, tricuspid regurgitation and aortic sclerosis.  Most concerning of these is the MR.  With improved blood pressure, lack of symptoms, and medication adjustments, would like to reassess 2D echo in roughly 6 months and then see him back in follow-up.

## 2021-11-09 NOTE — Telephone Encounter (Signed)
RN spoke to Dr Herbie Baltimore-   Dr Herbie Baltimore reviewed medication list   Per verbal order    The changes are    Morning medication please take Lisinopril  40 mg  Hydrochlorothiazide 50 mg  Hydralazine 100 mg     Lunch medication please take ( middle of the day) Atenolol 100 mg Hydralazine 100 mg    Bedtime medication please take  Verapamil 360 mg or Amlodipine 10 mg Hydralazine 100 mg    Continue taking all the other medication at their regular time.     Weaning the verapamil 360 mg take every other day at bedtime  for 2 weeks then stop after the 2 weeks. On the other days at bedtime for 2 weeks take Amlodipine 10 mg . After 2 weeks , start taking Amlodipine 10 mg daily at bedtime.   Information was sent to patient by mychart as well as verbal discuss on the phone to patient's wife. She verbalized understanding.

## 2021-11-09 NOTE — Telephone Encounter (Signed)
  Pt is requesting to speak with RN Jasmine December. She said, she supposed to call her back

## 2021-11-09 NOTE — Assessment & Plan Note (Addendum)
On simvastatin.  Should be due for labs to be checked.  Hopefully these will be checked by PCP. Continue simvastatin for now => would try to target LDL less than 70 if possible.  Although given his advanced age we do not need to be overly aggressive. He has diabetes listed as a diagnosis and is only taking metformin.  Would potentially consider SGLT2 inhibitor to help with mild diuresis & Hypertensive Heart Disease/. HFpEF

## 2021-11-09 NOTE — Assessment & Plan Note (Signed)
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  With his advanced age it is quite possible that there is multifactor's.  The one notable prominent finding was how much his PA pressures went up when the SBP increased as noted.  Need aggressive essential hypertension management.  We will space out his blood pressure medications as indicated to allow for full 3 times a day coverage with each dose of hydralazine. Converting from verapamil to amlodipine hopefully also for antianginal benefit and to allow for more heart rate responsiveness.

## 2021-11-09 NOTE — Telephone Encounter (Signed)
Spoke to patient's wife. She is calling back with  the name of  patient's medication list   Furosemide 20 mg daily  Verapamil 360 mg at bedtime  Vitamin B-12 100 mcg daily  Metformin 100 mg  twice a day  Lisinopril 40 mg  daily  Iron tablet 325 mg daily   Atenolol 100 mg daily  Hydrochlorothiazide 50 mg daily   Hydralazine 100 mg   3 times a day  Tylenol as needed   Patient is not taking Chlorthalidone  25 mg at present time. Per wife,states his blood pressure continue to be elevated. Patient  stopped medication an restarted the hydrochlorothiazide 50 mg  daily.  Mrs Cudworth is aware that RN will discuss with Dr Herbie Baltimore. Concerning medication list and contact  her and patient for  upcoming changes     Mrs.Marini verbalized understanding.

## 2021-11-09 NOTE — Patient Instructions (Addendum)
Medication Instructions:   Please call office to let us know what medication you are taking  *If you need a refill on your cardiac medications before your next appointment, please call your pharmacy*   Lab Work: Not needed    Testing/Procedures: Will be schedule at [facility] [address] Jan 2024 Your physician has requested that you have an echocardiogram. Echocardiography is a painless test that uses sound waves to create images of your heart. It provides your doctor with information about the size and shape of your heart and how well your heart's chambers and valves are working. This procedure takes approximately one hour. There are no restrictions for this procedure.    Follow-Up: At [facility], you and your health needs are our priority.  As part of our continuing mission to provide you with exceptional heart care, we have created designated Provider Care Teams.  These Care Teams include your primary Cardiologist (physician) and Advanced Practice Providers (APPs -  Physician Assistants and Nurse Practitioners) who all work together to provide you with the care you need, when you need it.     Your next appointment:   6 week(s)  The format for your next appointment:   In Person  Provider:   Your physician recommends that you schedule a follow-up appointment in:  with [facility] - help medication management - ( not sure what patient is taking)    Your physician recommends that you schedule a follow-up appointment in: Jan 2024 with Dr Herbie Baltimore    Your physician has requested that you have an echocardiogram. Echocardiography is a painless test that uses sound waves to create images of your heart. It provides your doctor with information about the size and shape of your heart and how well your heart's chambers and valves are working. This procedure takes approximately one hour. There are no restrictions for this procedure.    ADDENDUM After medications called in,  medication adjustments made Here are the instruction we discussed on the phone.   Morning medication please take Lisinopril  40 mg  Hydrochlorothiazide 50 mg  Hydralazine 100 mg      Lunch medication please take ( middle of the day) Atenolol 100 mg Hydralazine 100 mg      Bedtime medication please take  Verapamil 360 mg or Amlodipine 10 mg Hydralazine 100 mg      Continue taking all the other medication at their regular time.      Weaning  off the verapamil 360 mg take every other day at bedtime  for 2 weeks then stop after the 2 weeks is completed. On the other days at bedtime for 2 weeks take Amlodipine 10 mg . After 2 weeks , start taking Amlodipine 10 mg daily at bedtime.      The new medication was sent the pharmacy- [facility]  Please bring  your medication to your appointment on 12/21/21 with pharmacist-[facility]     Any question can contact  office. Jasmine December RN

## 2021-11-09 NOTE — Assessment & Plan Note (Signed)
Severe concentric LVH with severely dilated left atrium is consistent with severe hypertensive heart disease.  I do not think there is hypertrophic heart disease related to amyloid or other etiologies based on how significantly hypertensive he was upon initial evaluation.  Mainstay of treatment is antihypertensive titration with mild diuresis.  Adjust medications for resistant hypertension section.

## 2021-11-09 NOTE — Assessment & Plan Note (Signed)
This is not a new finding for him, it goes along with his pretty significant echocardiogram findings.  Everything seems to be relatively stable.  No signs of chronotropic incompetence but he is on to avoid all agents.  Plan: Wean off verapamil and convert to amlodipine. Next recommendation will be to wean off of atenolol and switch to carvedilol.

## 2021-11-09 NOTE — Assessment & Plan Note (Signed)
Unfortunately, he and his wife did not bring his med list or medications.  So we would hypertension or what he was actually taking during the visit.  His blood pressures are very high today even the pressure he is recording at home are still too high.  When his blood pressures are high, his pulmonary pressures are high indicating WHO class II symptoms.  Not exactly sure what to make of his blood pressure going higher with switching to chlorthalidone 50 mg from HCTZ? Need to clarify if he is indeed taking 100 mg TID hydralazine, and is he taking furosemide as well as HCTZ.   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 dose of 100 mg hydralazine)  Otherwise continue other medications  Take both lisinopril and HCTZ and 100 mg hydralazine in the morning  Take atenolol 100 mg along with hydralazine 100 mg in the middle of the day  He will follow-up with our [facility] clinical pharmacist for further titration of medications-will be to potentially convert from atenolol to carvedilol (better blood pressure control and less half-life). There is still room for medications such as spironolactone, nitrate.  Would do my best to avoid clonidine.

## 2021-11-12 ENCOUNTER — Encounter (HOSPITAL_COMMUNITY): Payer: Medicare Other | Admitting: Physical Therapy

## 2021-11-14 ENCOUNTER — Other Ambulatory Visit: Payer: Self-pay

## 2021-11-14 ENCOUNTER — Ambulatory Visit: Payer: Medicare Other | Attending: Physician Assistant | Admitting: Physician Assistant

## 2021-11-14 VITALS — BP 138/84 | HR 70 | Temp 97.6°F | Resp 16

## 2021-11-14 DIAGNOSIS — W01198A Fall on same level from slipping, tripping and stumbling with subsequent striking against other object, initial encounter: Secondary | ICD-10-CM | POA: Insufficient documentation

## 2021-11-14 DIAGNOSIS — W19XXXA Unspecified fall, initial encounter: Secondary | ICD-10-CM

## 2021-11-14 DIAGNOSIS — S0990XA Unspecified injury of head, initial encounter: Secondary | ICD-10-CM | POA: Insufficient documentation

## 2021-11-14 DIAGNOSIS — Y92090 Kitchen in other non-institutional residence as the place of occurrence of the external cause: Secondary | ICD-10-CM | POA: Insufficient documentation

## 2021-11-14 DIAGNOSIS — Y9389 Activity, other specified: Secondary | ICD-10-CM | POA: Insufficient documentation

## 2021-11-14 DIAGNOSIS — Y998 Other external cause status: Secondary | ICD-10-CM | POA: Insufficient documentation

## 2021-11-14 NOTE — Patient Instructions (Signed)
Apart from more chronic symptoms, or symptoms that could be attributed to new medication there is concern for significant facial and head injury from fall 4 days ago. Recommend further immediate evaluation in [facility] Emergency Room

## 2021-11-14 NOTE — UC Provider Note (Signed)
History     Chief Complaint   Patient presents with   . Fall     The PT is here today with his daughter. Daughter states she is here to make sre the Pt is stable to leave for the [place] clinic tomorrow. PT last fell four days ago d/t what the PT states is multiple neurological deficets. No complaints of dizziness.     Patient is an 81 yo male with pmh including CAD, HTN, HLD, CLL presenting to UC with a cc of fall. He presents with his daughter who helps supply history. Essentially, they were encouraged by oncology office to seek medical attention in order to determine if patient is safe to travel to [place] for appointment tomorrow morning. Patient started a new chemotherapy on 6/26. Since then patient they report he has started to decline. Daughter reports weight loss, voice change, gait instability and falls. Patient reports a fall 4 days ago. He tripped in the kitchen and hit his head/face on a stool. He denies LOC. There is obvious facial swelling/bruising. He has baseline L sided facial weakness. He denies new weakness/numbness. He denies chest pain, SOB, dizziness, palpitation, headache, vision changes.         Medical/Surgical/Family History     Past Medical History:   Diagnosis Date   . Coronary artery disease    . Hyperlipemia    . Hypertension    . Leukemia     cll   . MI (myocardial infarction)         Patient Active Problem List   Diagnosis Code   . Acute hypoxemic respiratory failure due to COVID-19 U07.1, J96.01   . Leukemia C95.90   . Coronary artery disease I25.10   . Essential hypertension I10   . Acute kidney injury N17.9   . Hyponatremia E87.1   . 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 Pneumonia due to COVID-19 virus U07.1, J12.82   . COVID-19 U07.1   . Lymphadenopathy R59.1   . BPH (benign prostatic hyperplasia) N40.0            Past Surgical History:   Procedure Laterality Date   . CORONARY ANGIOPLASTY WITH STENT PLACEMENT       History reviewed. No pertinent  family history.       Social History     Tobacco Use   . Smoking status: Never   . Smokeless tobacco: Never   Substance Use Topics   . Alcohol use: Yes     Comment: occasionally   . Drug use: Never     Living Situation     Questions Responses    Patient lives with Spouse    Homeless No    Caregiver for other family member     External Services     Employment Retired    Domestic Violence Risk No                Review of Systems   Review of Systems   Constitutional: Negative for chills, fatigue and fever.   Cardiovascular: Negative for chest pain and palpitations.   Gastrointestinal: Negative for abdominal pain, diarrhea, nausea and vomiting.   Skin: Positive for color change and wound.   Neurological: Negative for dizziness, speech difficulty, weakness, numbness and headaches.        +gait issues       Physical Exam   Vitals     First Recorded BP: (!) 187/84, Resp: 16, Temp: 36.4 C (97.6  F), Temp src: TEMPORAL Oxygen Therapy SpO2: 99 %, Heart Rate: 70, (11/14/21 1329)  .    BP 138/84    Physical Exam  Vitals and nursing note reviewed.   Constitutional:       General: He is not in acute distress.     Appearance: He is well-developed. He is not ill-appearing, toxic-appearing or diaphoretic.   HENT:      Head: Normocephalic. Abrasion and contusion present. No raccoon eyes or Battle's sign.      Comments: Multiple bruises and abrasions      Right Ear: No hemotympanum.      Left Ear: No hemotympanum.      Nose: Signs of injury present.   Eyes:      Conjunctiva/sclera:      Left eye: Left conjunctiva is injected.   Cardiovascular:      Rate and Rhythm: Normal rate and regular rhythm.   Pulmonary:      Effort: Pulmonary effort is normal. No respiratory distress.      Breath sounds: Normal breath sounds. No stridor. No wheezing, rhonchi or rales.   Musculoskeletal:      Cervical back: Neck supple. No pain with movement, spinous process tenderness or muscular tenderness.   Neurological:      Mental Status: He is alert and  oriented to person, place, and time.      Cranial Nerves: Dysarthria and facial asymmetry present.      Sensory: Sensation is intact.      Coordination: Heel to Shin Test normal.      Comments: Patient denies change in vision. Pupils are equal, round and reactive to light. EOM intact. Face is not symmetric - left sided facial weakness. Hearing intact to voice. Palate elevates symmetrically. Head turning and shoulder shrug intact. Tongue is midline and with normal movement.     Sensation of bilateral UE and LE intact and symmetric. Intact strength of extremities.           Medical Decision Making   Medical Decision Making  Assessment:    Patient is an 81 yo male with pmh including CAD, HTN, HLD, CLL presenting to UC with a cc of fall. He presents with his daughter who helps supply history. Essentially, they were encouraged by oncology office to seek medical attention in order to determine if patient is safe to travel to [place] for appointment tomorrow morning. Patient started a new chemotherapy on 6/26. Since then patient they report he has started to decline. Daughter reports weight loss, voice change, gait instability and falls. Patient reports a fall 4 days ago - on exam VSS. Lungs CTA. HRR. Obvious facial injury - bruising and abrasion. Facial weakness - patient states unchanged from baseline. No extremity weakness or numbness. Normal heel to shin     Differential diagnosis:    Contusion, fracture, ICH, concussion, chemotherapy SE, electrolyte abnormality,     Plan and Results:    Had discussed with patient, daughter and son- in Social worker. Discussed limited scope of urgent care. patient had a fall with head injury 4 days ago. Recommend further immediate evaluation in the ED for CT. Also discuss that patient would benefit from lab work given increased faituge, gait imbalance the past few weeks. He cannot be adequately assessed here at urgent care.They prefer to call and speak with onocologist prior to going to the  ED. Risks of delayed care discussed. Questions were addressed          Final Diagnosis  ICD-10-CM ICD-9-CM   1. Fall, initial encounter  W19.XXXA E888.9   2. Injury of head, initial encounter  S09.90XA 959.01         Kizzie Ide, [place]

## 2021-11-16 ENCOUNTER — Encounter (HOSPITAL_COMMUNITY): Payer: Medicare Other | Admitting: Physical Therapy

## 2021-11-16 NOTE — Telephone Encounter (Signed)
First no show:   Called pt re no show.  No answer.  Left message to call department as today was his last scheduled appointment.  Virgina Organ, PT CLT [phone]

## 2021-12-05 NOTE — Telephone Encounter (Addendum)
Spoke with patients daughter  They are calling in as he experienced irregular heartbeat  Please advise    bcb [phone]

## 2021-12-06 LAB — UNMAPPED LAB RESULTS
ABO RH Blood Type (HT): O POS
Antibody Screen (HT): NEGATIVE
Basophil # (HT): 0 10 3/uL (ref 0.0–0.2)
Basophil % (HT): 0 % (ref 0–3)
Eosinophil # (HT): 0.8 10 3/uL — ABNORMAL HIGH (ref 0.0–0.6)
Eosinophil % (HT): 1 % (ref 0–5)
Hematocrit (HT): 36 % — ABNORMAL LOW (ref 40–52)
Hemoglobin (HGB) (HT): 10.8 g/dL — ABNORMAL LOW (ref 13.0–18.0)
Lymphocyte # (HT): 72.4 10 3/uL — ABNORMAL HIGH (ref 1.0–4.8)
Lymphocyte % (HT): 89 % — ABNORMAL HIGH (ref 15–45)
MCHC (HT): 30.4 g/dL — ABNORMAL LOW (ref 32.0–37.5)
MCV (HT): 104 fL — ABNORMAL HIGH (ref 80–100)
Mean Corpuscular Hemoglobin (MCH) (HT): 31.6 pg (ref 26.0–34.0)
Monocyte # (HT): 0 10 3/uL — ABNORMAL LOW (ref 0.1–1.0)
Monocyte % (HT): 0 % (ref 0–15)
Neutrophil # (HT): 8.1 10 3/uL — ABNORMAL HIGH (ref 1.8–8.0)
Platelets (HT): 166 10 3/uL (ref 150–450)
RBC (HT): 3.42 10 6/uL — ABNORMAL LOW (ref 4.40–6.20)
RDW (HT): 14.9 % (ref 0.0–15.2)
Seg Neut % (HT): 10 % — ABNORMAL LOW (ref 45–75)
WBC (HT): 81.3 10 3/uL — ABNORMAL HIGH (ref 4.0–11.0)

## 2021-12-06 NOTE — ED Notes (Signed)
Pt reporting he wants to go home, stating that he has been feeling fine since his arrival. SecureChat message sent to ED and admitting providers, awaiting response.

## 2021-12-06 NOTE — Unmapped External Note (Signed)
Patient is being treated for cancer and has developed A fib  Today complains of palpitations in his chest   Dizziness, lightheaded  Shortness of breath

## 2021-12-06 NOTE — Telephone Encounter (Signed)
Pt woke up 2 nights ago with an irregular heart beat and thought he was in atrial fibrillation.  Pt is not having any shortness of breath - no changes in daily activities.  Pt's daughter is requesting we speak to Larry Choi- pt's wife instead of her.  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  Pt wants to be seen immediately- advised Pt's wife that if pt feels he should be seen immediately he needs to go to ED or urgent care as our office cannot take walk in appointments.  Assured Larry Choi that Dr [name] would be sent this message immediately and we will call back with further information. Note:  pt's LOV was 05/10/20- pt was no show 01/29/21-  msg routed to Dr [name]

## 2021-12-06 NOTE — Telephone Encounter (Signed)
Pt daughter calling back regarding irregular heartbeat. Per pt daughter if no one can call her then, someone can call pt's wife.       FYI pt daughter Lanora Manis is on the communication forms that have been scanned in.

## 2021-12-06 NOTE — Telephone Encounter (Signed)
Patient is in the [facility] ED  now .Marland KitchenMarland Kitchen

## 2021-12-06 NOTE — ED Notes (Signed)
Pt had already removed his IV. Wife back at bedside to bring him home. Admitting provider paged, no response before pt walked out of the ED.

## 2021-12-06 NOTE — ED Provider Notes (Signed)
[facility] ADULT ED  History   81 year old male past medical history of CAD, hypertension, BPH, CLL, depression, GERD, hyperlipidemia, hypertension, Ramsay Hunt received ibrutinib and IVIG.  He has been having trouble speaking and swallowing and as a result has had 40 pound weight loss that required a PEG tube.  He presents to the emergency department today for generalized weakness, palpitations, chest pain and shortness of breath.      History provided by:  Patient and spouse  Palpitations      Patient Active Problem List   Diagnosis   . 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 CAD (coronary artery disease)   . Hypertension       Past Medical History:   Diagnosis Date   . BPH (benign prostatic hyperplasia)    . CLL (chronic lymphocytic leukemia) (CMS HCC Code)    . Depression    . GERD (gastroesophageal reflux disease)    . H/O echocardiogram 12/2020     Normal LV function, LVH, Ao Sclerosis, Ao Root 46, AsAo 42   . Hyperlipidemia    . Hypertension    . Incomplete RBBB    . S/P angioplasty with stent 11/2012    3.0x16 Promus DES D RCA   . S/P angioplasty with stent 08/2017    (ND) 3.0x38 Synergy DES LAD   . 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 CARDIAC CATHETERIZATION  01/09/2018    Severe 2 vessel coronary artery disease    . CORONARY ANGIOPLASTY WITH STENT PLACEMENT  11/09/12    3.0 x 16 Promus to dRCA       Family History   Problem Relation Age of Onset   . Cancer Mother    . Stroke Mother    . Arrhythmia Father    . Alcohol abuse Father    . Heart disease Brother        Social History     Tobacco Use   . Smoking status: Never   . Smokeless tobacco: Never   Substance Use Topics   . Alcohol use: Yes     Comment: 3-4 ounces scotch nightly   . Drug use: No     Sexual Activity     Substance and Sexual Activity   Sexual Activity Not on file       Review of Systems   Cardiovascular: Positive  for palpitations.       Physical Exam     Vitals:    12/06/21 1114 12/06/21 1358   BP: (!) 143/83 (!) 170/77   Pulse: 78 66   Resp: 18 18   Temp: 36.5 C (97.7 F) 36.5 C (97.7 F)   TempSrc: Oral Oral   SpO2: 99% 97%   Weight: 59 kg (130 lb)    Height: 1.753 m (5\' 9" )        Physical Exam  Vitals and nursing note reviewed.   Constitutional:       General: He is not in acute distress.     Appearance: Normal appearance. He is well-developed. He is ill-appearing (Chronically). He is not toxic-appearing or diaphoretic.   HENT:      Head: Normocephalic and atraumatic.      Comments: Left facial droop  Eyes:      Extraocular Movements: Extraocular movements intact.      Conjunctiva/sclera: Conjunctivae normal.  Cardiovascular:      Rate and Rhythm: Normal rate and regular rhythm.      Pulses: Normal pulses.      Heart sounds: Normal heart sounds. No murmur heard.  Pulmonary:      Effort: Pulmonary effort is normal. No respiratory distress.      Breath sounds: Normal breath sounds. No wheezing, rhonchi or rales.   Abdominal:      General: There is no distension.      Palpations: Abdomen is soft.      Tenderness: There is no abdominal tenderness. There is no guarding or rebound.      Comments: PEG tube in place   Musculoskeletal:         General: No swelling or tenderness. Normal range of motion.      Cervical back: Full passive range of motion without pain, normal range of motion and neck supple. Normal range of motion.   Skin:     General: Skin is warm and dry.      Findings: Bruising present.      Comments: Bruising right upper arm   Neurological:      General: No focal deficit present.      Mental Status: He is alert and oriented to person, place, and time.      Cranial Nerves: Cranial nerve deficit and facial asymmetry present.      Sensory: Sensation is intact. No sensory deficit.      Motor: Motor function is intact. No weakness.      Comments: His speech is slow and slurred    Left facial droop   Psychiatric:          Mood and Affect: Mood normal.         Behavior: Behavior normal.         ED Procedures   --------------------  EKG    Date/Time: 12/06/2021 11:18 AM  Performed by: Wendi Snipes, MD  Authorized by: Wendi Snipes, MD   EKG reviewed: EKG reviewed and personally interpreted by me  Cardiac rate (bpm): 75 bpm  EKG rhythm: sinus rhythm  Axis: normal  PR interval: normal PR interval  QRS interval: right bundle branch block  QT interval: normal QT interval  ST segment depression in lead: V3  T wave inversion on lead: V3  Clinical impression: NSR, RBBB  --------------------      ED Course   ED MDM:      Differential Diagnosis:     Differential diagnosis includes:  Abnormalities, dehydration, sepsis, Sirs, anemia, pneumonia, deconditioning, failure to thrive    ED Course:     ED course details:  81 year old male with history of CLL who presents to the emergency department with generalized weakness, chest pain, shortness of breath, palpitations for a few days.  Plan for labs, EKG, chest x-ray and anticipate hospitalization.    Labs notable for white blood cell count of 81.3 which is decreased somewhat from most recent labs, anemia otherwise reassuring    Chest x-ray    Impression:    1. Underinflated lungs, grossly clear.   2. Suspected subacute/chronic left lateral sixth and seventh rib fractures.     Spoke with the hospitalist who accepts patient for hospitalization.    Discussion with independent historian(s):     Independent historian(s) utilized:  Patient and spouse  Admission consideration:     Admission/observation considered?:  Yes  Discussion with other healthcare provider(s):     Patient management discussed with:  Admitting physician  External records reviewed:     External record(s) reviewed in the care of patient:  Office record, Outpatient record and Inpatient record    Record comment:  [facility]     Chronic conditions:   Other    Conditions comment:  CAD, hypertension, BPH, CLL, depression, GERD, hyperlipidemia, hypertension, Ramsay Hunt    Disposition:     Patient Reassessment: Yes    Admission: The admitting physician was notified, case discussed, and further patient care was handed off.  I have notified the patient and/or guardian regarding admission.                               ED Attestation   Attestation           Wendi Snipes, MD  12/06/21 [phone]

## 2021-12-06 NOTE — ED Notes (Signed)
Spoke with pt's wife on the phone to discuss pt wanting to [facility]. Wife wants pt to stay, RN advised that sine pt was A&Ox3, we could not force the patient to stay. Wife was going to try to get another family member to speak with pt to convince him to stay.

## 2021-12-06 NOTE — Telephone Encounter (Signed)
7:30 tomorrow please

## 2021-12-07 NOTE — Telephone Encounter (Signed)
Call attempt made by [facility] Nurse, patient was reached.

## 2021-12-21 ENCOUNTER — Encounter: Payer: Self-pay | Admitting: Pharmacist Clinician (PhC)/ Clinical Pharmacy Specialist

## 2021-12-21 ENCOUNTER — Ambulatory Visit: Payer: Medicare Other | Admitting: Pharmacist Clinician (PhC)/ Clinical Pharmacy Specialist

## 2021-12-21 DIAGNOSIS — I10 Essential (primary) hypertension: Secondary | ICD-10-CM

## 2021-12-21 MED ORDER — FUROSEMIDE 20 MG PO TABS: 20.0000 mg | ORAL_TABLET | Freq: Every day | ORAL | 3 refills | Status: DC

## 2021-12-21 MED ORDER — CARVEDILOL 25 MG PO TABS: 25.0000 mg | ORAL_TABLET | Freq: Two times a day (BID) | ORAL | 3 refills | Status: DC

## 2021-12-21 NOTE — Progress Notes (Signed)
12/21/2021 VIVIANO BIR December 27, 1940 [id number]   HPI:  Mark Cross is a 81 y.o. male patient of Dr Herbie Baltimore, with a PMH below who presents today for hypertension clinic evaluation.  Patient reports has had hypertension since he was in his 9's and has rarely been below 140 systolic in all these years.  States most home readings 145-170 systolic, diastolic has always been WNL.  He saw Dr. Herbie Baltimore last month and there was some confusion about what medications he was on.  Verapamil was switched to amlodipine for better control and he was asked to follow up with PharmD and bring all medications for review.    Today he is in the office with his wife.  He did bring a bag full of all his current medications.  There are no concerns with medication costs and he gets most filled through mail order service.  The only potential side effect that concerns him is ongoing diarrhea.  His only other complaint is that he rarely sleeps more than 2 hour spells and is awake much of the night.  Will often lay down after breakfast for a short time, but doesn't often sleep, some afternoon naps, again usually < 2 hours.   Past Medical History: hyperlipidemia Followed by pcp on simvastatin  DM2 5/23 A1c 6.5 on metformin 1 gm bid     Blood Pressure Goal:  130/80  Current Medications:   AM: lisinopril 40, hctz 50, hydralazine 100 Noon: atenolol 100, hydralazine 100   PM: amlodipine 10, hydralazine 100  Social Hx: quit smoking about 30 years ago; occasional beer; has cut back on that over time; coffee each morning 1 cup   Diet: Gatorade 20 oz/day; vegetables - fresh at this time of year, canned/frozen in the winter; not much meat, likes seafood, for protein beans few times each week  Exercise: walks most days has some "swimmyheadedness" so uses cane  Home BP readings:  by recall systolic range 144/175; diastolic rarely as high as 80   Intolerances: nkda  Labs:  5/23: Na 142, K 4.,Gllu 101, BUN 38, SCr 1.18,  GFR > 60   Wt Readings from Last 3 Encounters:  11/09/21 128 lb (58.1 kg)  09/26/21 129 lb (58.5 kg)  08/31/21 130 lb 12.8 oz (59.3 kg)   BP Readings from Last 3 Encounters:  12/21/21 (!) 172/62  11/09/21 (!) 179/82  10/02/21 (!) 143/55   Pulse Readings from Last 3 Encounters:  12/21/21 (!) 57  11/09/21 (!) 59  10/02/21 66    Current Outpatient Medications  Medication Sig Dispense Refill   amLODipine (NORVASC) 10 MG tablet Take 1 tablet (10 mg total) by mouth daily. 90 tablet 3   carvedilol (COREG) 25 MG tablet Take 1 tablet (25 mg total) by mouth 2 (two) times daily. 60 tablet 3   ferrous sulfate 325 (65 FE) MG tablet Take 325 mg by mouth in the morning.     furosemide (LASIX) 20 MG tablet Take 1 tablet (20 mg total) by mouth daily. 90 tablet 3   hydrALAZINE (APRESOLINE) 100 MG tablet Take 100 mg by mouth 3 (three) times daily.     hydrochlorothiazide (HYDRODIURIL) 50 MG tablet Take 50 mg by mouth daily.     lisinopril (ZESTRIL) 40 MG tablet Take 40 mg by mouth in the morning.     metFORMIN (GLUCOPHAGE) 1000 MG tablet Take 1,000 mg by mouth 2 (two) times daily with a meal.     simvastatin (ZOCOR) 40 MG  tablet Take 40 mg by mouth in the morning.     vitamin B-12 (CYANOCOBALAMIN) 1000 MCG tablet Take 1,000 mcg by mouth in the morning.     acetaminophen (TYLENOL) 500 MG tablet Take 2 tablets (1,000 mg total) by mouth every 6 (six) hours as needed for mild pain or moderate pain. 60 tablet 0   Current Facility-Administered Medications  Medication Dose Route Frequency Provider Last Rate Last Admin   sodium chloride flush (NS) 0.9 % injection 3 mL  3 mL Intravenous Q12H Monge, Emily C, NP        No Known Allergies  Past Medical History:  Diagnosis Date   DJD (degenerative joint disease), lumbosacral    Hyperlipidemia    On statin   Hypertension    Resistant; on 4 medications.;  Doppler evidence of RAS-but does not meet criteria for RA revascularization by CORAL Trial    Osteoarthritis of right hip    Bone-on-bone pain more severe pain (Dr. Margarita Rana)   PAD (peripheral artery disease) (HCC) 03/14/2021   ABIs to evaluate right foot pain: R ABI 0.67, TBI 0.34.  L ABI 1.12, TBI 0.52.  Indicates moderate RLE arterial disease.  No significant L LE disease.   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 Nolon Bussing from vascular surgery for bilateral RAS and SMA stenosis given poorly controlled hypertension.  Recommended to continue medical management as he did not meet criteria based on CORAL Trial.   Type 2 diabetes mellitus without complication (HCC)    On metformin only.    Blood pressure (!) 172/62, pulse (!) 57.  Resistant hypertension Patient with resistant hypertension, currently on 5 medications.  Will stop atenolol 100 mg and instead have him take carvedilol 25 mg bid.  Also decrease hydrochlorothiazide to 25 mg, as there is no evidence of benefit at the 50 mg dose.  He will continue with lisinopril, amlodipine and hydralazine.  I have asked that he check BP daily in the mornings before breakfast and bring a list of those readings, in addition to home cuff, to next appointment.  His arms are quite thin and it's possible his home cuff is too large.  If pressure still elevated at next appointment, will consider adding spironolactone.  He will return in 4 weeks for follow up.    Phillips Hay PharmD CPP [facility] [address] [place], [place] [address] [phone]

## 2021-12-21 NOTE — Assessment & Plan Note (Signed)
Patient with resistant hypertension, currently on 5 medications.  Will stop atenolol 100 mg and instead have him take carvedilol 25 mg bid.  Also decrease hydrochlorothiazide to 25 mg, as there is no evidence of benefit at the 50 mg dose.  He will continue with lisinopril, amlodipine and hydralazine.  I have asked that he check BP daily in the mornings before breakfast and bring a list of those readings, in addition to home cuff, to next appointment.  His arms are quite thin and it's possible his home cuff is too large.  If pressure still elevated at next appointment, will consider adding spironolactone.  He will return in 4 weeks for follow up.

## 2021-12-21 NOTE — Patient Instructions (Signed)
Return for a a follow up appointment Monday Sept 18 at 1:30 pm  Your blood pressure today is 178/62   Check your blood pressure at home daily and keep record of the readings.  Take your BP meds as follows:  Stop atenolol.  Start carvedilol 25 mg twice daily (morning and evening)  Cut hydrochlorothiazide tablets in half and take 25 mg once daily at mid-day  NEW SCHEDULE:  AM: lisinopril 40 mg, hydralazine 100 mg, carvedilol 25 mg, metformin 1,000 mg, furosemide 20 mg  MID:  hydrochlorothiazide 25 mg (1/2 50 mg tab), hydralazine 100 mg, iron tab, B12  PM: amlodipine 10 mg, hydralazine 100 mg, carvedilol 25 mg, simvastatin 40 mg, metformin 1,000 mg   Ask your PCP about switching metformin to metformin ER to help decrease GI symptoms.  Bring all of your meds, your BP cuff and your record of home blood pressures to your next appointment.  Exercise as you're able, try to walk approximately 30 minutes per day.  Keep salt intake to a minimum, especially watch canned and prepared boxed foods.  Eat more fresh fruits and vegetables and fewer canned items.  Avoid eating in fast food restaurants.    HOW TO TAKE YOUR BLOOD PRESSURE: Rest 5 minutes before taking your blood pressure.  Don't smoke or drink caffeinated beverages for at least 30 minutes before. Take your blood pressure before (not after) you eat. Sit comfortably with your back supported and both feet on the floor (don't cross your legs). Elevate your arm to heart level on a table or a desk. Use the proper sized cuff. It should fit smoothly and snugly around your bare upper arm. There should be enough room to slip a fingertip under the cuff. The bottom edge of the cuff should be 1 inch above the crease of the elbow. Ideally, take 3 measurements at one sitting and record the average.

## 2022-01-14 ENCOUNTER — Telehealth: Payer: Self-pay

## 2022-01-14 NOTE — Telephone Encounter (Signed)
Left voicemail for Larry Choi to call back regarding Chanan's referral.

## 2022-01-14 NOTE — Telephone Encounter (Signed)
Lanora Manis daughter called asking about the status of the referral she would like a call back from [place] her number is [phone]

## 2022-01-15 ENCOUNTER — Other Ambulatory Visit: Payer: Medicare Other

## 2022-01-15 ENCOUNTER — Encounter: Payer: Self-pay | Admitting: Family Medicine

## 2022-01-15 DIAGNOSIS — C9110 Chronic lymphocytic leukemia of B-cell type not having achieved remission: Secondary | ICD-10-CM

## 2022-01-16 ENCOUNTER — Other Ambulatory Visit: Payer: Medicare Other

## 2022-01-16 NOTE — Hospice (Signed)
Discipline SW  Visit Summary:    SW covisit with RN Rayfield Citizen for evaluation visit at patient's home. Wife Larry Choi present. Patient is alert and oriented, just got out of the shower, and is now lying in his bed. He welcomes SW and RN into the room. Wife Larry Choi reports that her step-daughter Larry Choi, out of state, coordinated hospice referral. She is unsure of what hospice entails or the details of what prompted hospice referral. Larry Choi shared that patient has had CLL x 20 years, with a big decline in the last year, with falls and ED visits (patient signed out AMA of ED and removed his own IV). Patient is a physician.     Patient declined hospice admission and indicates that he does not desire EOL help at home. RN described Medicare hospice benefit and services, and discussed comfort care approach at home. Wife Larry Choi seems receptive to help, but understands that Larry Choi is able to make his own decisions and declines help at present. After discussion and conversation, RN asked patient again if he would like to have hospice at home. [name] politely declines.    SW left caregiver guide with Larry Choi and encouraged family to reach out to MD for another referral, if Larry Choi is agreeable to care. Patient and wife voiced understanding.

## 2022-01-16 NOTE — Nursing Note (Signed)
Trelyn was evaluated by RN and was not taken under care today as he declines hospice services.    Co-visit with Vernell Leep, SW- Greeted at door by Corrie Dandy pts wife who reports that Damyon was finishing in the shower.   Howard lying in bed, A/O x3 - able to speak in a whisper and write on a pad. Purpose of visit discussed that referral was received requesting initial hospice visit. Srikar declines services.   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 He has had significant weight loss, PEG tube inserted in July 2023, has recently stopped curative treatment. Discussed hospice philosophy, plan of care, hospice benefit with Burman who again declines services. Caregiver guide and contact information left with Corrie Dandy, instructed to contact MD office for another referral if they would like services.      TC to Dr Jamey Reas office, spoke with Fredric Mare, message left that Onalee Hua declined hospice services.

## 2022-01-19 NOTE — Progress Notes (Unsigned)
01/28/2022 Mark Cross Jul 12, 1940 [id number]   HPI:  Mark Cross is a 81 y.o. male patient of Dr Ellyn Hack, with a PMH below who presents today for hypertension clinic evaluation.  Patient reports has had hypertension since he was in his 71's and has rarely been below [id number] systolic in all these years.  States most home readings [id number] systolic, diastolic has always been WNL.  He saw Dr. Ellyn Hack last month and there was some confusion about what medications he was on.  Verapamil was switched to amlodipine for better control and he was asked to follow up with PharmD and bring all medications for review.    Today he is in the office with his wife.  He did bring a bag full of all his current medications.  There are no concerns with medication costs and he gets most filled through mail order service.  The only potential side effect that concerns him is ongoing diarrhea.  His only other complaint is that he rarely sleeps more than 2 hour spells and is awake much of the night.  Will often lay down after breakfast for a short time, but doesn't often sleep, some afternoon naps, again usually < 2 hours.   At last visit switched atenolol to carvedilol 25 bid.  Decresed hctz to 25 mg (no evidence of benefit at 50 mg dose).  Today he is in the office with both his wrist cuff ([employer]) and arm cuff (Omron).  While the wrist cuff read about 12 points lower than in office reading, his Omron cuff was 1/3 points different.      Past Medical History: hyperlipidemia Followed by pcp on simvastatin  DM2 5/23 A1c 6.5 on metformin 1 gm bid     Blood Pressure Goal:  130/80  Current Medications:   AM: lisinopril 40, hctz 25,  hydralazine 100, carvedilol 25 Noon: hydralazine 100   PM: amlodipine 10, hydralazine 100, carvedilol 25   Social Hx: quit smoking about 30 years ago; occasional beer; has cut back on that over time; coffee each morning 1 cup   Diet: has cut back on Gatorade - was drinking 20  oz/day; vegetables - fresh at this time of year, canned/frozen in the winter; not much meat, likes seafood, for protein beans few times each week  Exercise: walks most days has some "swimmyheadedness" so uses cane  Home BP readings:  home readings from mix of wrist and arm monitors  AM 19 readings average 152/57  HR 65  PM 15 readings average 150/54  HR 73  Intolerances: nkda  Labs:  5/23: Na 142, K 4.,Gllu 101, BUN 38, SCr 1.18, GFR > 60   Wt Readings from Last 3 Encounters:  11/09/21 128 lb (58.1 kg)  09/26/21 129 lb (58.5 kg)  08/31/21 130 lb 12.8 oz (59.3 kg)   BP Readings from Last 3 Encounters:  12/21/21 (!) 172/62  11/09/21 (!) 179/82  10/02/21 (!) 143/55   Pulse Readings from Last 3 Encounters:  12/21/21 (!) 57  11/09/21 (!) 59  10/02/21 66    Current Outpatient Medications  Medication Sig Dispense Refill   acetaminophen (TYLENOL) 500 MG tablet Take 2 tablets (1,000 mg total) by mouth every 6 (six) hours as needed for mild pain or moderate pain. 60 tablet 0   amLODipine (NORVASC) 10 MG tablet Take 1 tablet (10 mg total) by mouth daily. 90 tablet 3   carvedilol (COREG) 25 MG tablet Take 1 tablet (25 mg total) by mouth  2 (two) times daily. 60 tablet 3   ferrous sulfate 325 (65 FE) MG tablet Take 325 mg by mouth in the morning.     furosemide (LASIX) 20 MG tablet Take 1 tablet (20 mg total) by mouth daily. 90 tablet 3   hydrALAZINE (APRESOLINE) 100 MG tablet Take 100 mg by mouth 3 (three) times daily.     hydrochlorothiazide (HYDRODIURIL) 50 MG tablet Take 25 mg by mouth daily.     lisinopril (ZESTRIL) 40 MG tablet Take 40 mg by mouth in the morning.     metFORMIN (GLUCOPHAGE-XR) 500 MG 24 hr tablet Take 500 mg by mouth 2 (two) times daily with a meal.     simvastatin (ZOCOR) 40 MG tablet Take 40 mg by mouth in the morning.     spironolactone (ALDACTONE) 25 MG tablet Take 1 tablet (25 mg total) by mouth daily. 30 tablet 6   vitamin B-12 (CYANOCOBALAMIN) 1000 MCG tablet  Take 1,000 mcg by mouth in the morning.     Current Facility-Administered Medications  Medication Dose Route Frequency Provider Last Rate Last Admin   sodium chloride flush (NS) 0.9 % injection 3 mL  3 mL Intravenous Q12H Monge, Emily C, NP        No Known Allergies  Past Medical History:  Diagnosis Date   DJD (degenerative joint disease), lumbosacral    Hyperlipidemia    On statin   Hypertension    Resistant; on 4 medications.;  Doppler evidence of RAS-but does not meet criteria for RA revascularization by CORAL Trial   Osteoarthritis of right hip    Bone-on-bone pain more severe pain (Dr. Edmonia Lynch)   PAD (peripheral artery disease) ([place]) 03/14/2021   ABIs to evaluate right foot pain: R ABI 0.67, TBI 0.34.  L ABI 1.12, TBI 0.52.  Indicates moderate RLE arterial disease.  No significant L LE disease.   RAS (renal artery stenosis) ([facility]) 03/2021   Seen by Dr. Gwenlyn Saran from vascular surgery for bilateral RAS and SMA stenosis given poorly controlled hypertension.  Recommended to continue medical management as he did not meet criteria based on CORAL Trial.   Type 2 diabetes mellitus without complication ([facility])    On metformin only.    There were no vitals taken for this visit.  Resistant hypertension Patient with resistant hypertension, currently on 5 medications without control.  Will add spironolactone 25 mg once daily.  He will need to repeat metabolic panel in 2 weeks and we will see him in 4 weeks for follow up.  He was encouraged to use the Omron cuff for more accurate readings.     [name] PharmD CPP [facility] [address] [place],  [address] [phone]

## 2022-01-21 ENCOUNTER — Ambulatory Visit: Payer: Medicare Other | Attending: Cardiology | Admitting: Pharmacist Clinician (PhC)/ Clinical Pharmacy Specialist

## 2022-01-21 DIAGNOSIS — I10 Essential (primary) hypertension: Secondary | ICD-10-CM | POA: Diagnosis not present

## 2022-01-21 MED ORDER — SPIRONOLACTONE 25 MG PO TABS: 25.0000 mg | ORAL_TABLET | Freq: Every day | ORAL | 6 refills | Status: DC

## 2022-01-21 NOTE — Patient Instructions (Signed)
Return for a a follow up appointment Monday October 23 at 1:30 pm  Go to the lab in 2 weeks (week of October 2)  Take your BP meds as follows:  AM: lisinopril 40 mg, hydrochlorothiazide 25 mg,  hydralazine 100 mg, carvedilol 25 mg Noon: hydralazine 100, spironolactone 25 mg PM: amlodipine 10 mg, hydralazine 100 mg, carvedilol 25 mg  Bring all of your meds, your BP cuff and your record of home blood pressures to your next appointment.  Exercise as you're able, try to walk approximately 30 minutes per day.  Keep salt intake to a minimum, especially watch canned and prepared boxed foods.  Eat more fresh fruits and vegetables and fewer canned items.  Avoid eating in fast food restaurants.    HOW TO TAKE YOUR BLOOD PRESSURE: Rest 5 minutes before taking your blood pressure.  Don't smoke or drink caffeinated beverages for at least 30 minutes before. Take your blood pressure before (not after) you eat. Sit comfortably with your back supported and both feet on the floor (don't cross your legs). Elevate your arm to heart level on a table or a desk. Use the proper sized cuff. It should fit smoothly and snugly around your bare upper arm. There should be enough room to slip a fingertip under the cuff. The bottom edge of the cuff should be 1 inch above the crease of the elbow. Ideally, take 3 measurements at one sitting and record the averag

## 2022-01-28 NOTE — Assessment & Plan Note (Signed)
Patient with resistant hypertension, currently on 5 medications without control.  Will add spironolactone 25 mg once daily.  He will need to repeat metabolic panel in 2 weeks and we will see him in 4 weeks for follow up.  He was encouraged to use the Omron cuff for more accurate readings.

## 2022-01-30 ENCOUNTER — Emergency Department: Payer: Medicare Other

## 2022-01-30 ENCOUNTER — Encounter: Payer: Self-pay | Admitting: Student in an Organized Health Care Education/Training Program

## 2022-01-30 ENCOUNTER — Inpatient Hospital Stay
Admission: EM | Admit: 2022-01-30 | Discharge: 2022-03-06 | DRG: 640 | Disposition: E | Payer: Medicare Other | Source: Ambulatory Visit | Attending: Internal Medicine | Admitting: Internal Medicine

## 2022-01-30 ENCOUNTER — Other Ambulatory Visit: Payer: Self-pay

## 2022-01-30 DIAGNOSIS — C9110 Chronic lymphocytic leukemia of B-cell type not having achieved remission: Secondary | ICD-10-CM | POA: Diagnosis present

## 2022-01-30 DIAGNOSIS — R4182 Altered mental status, unspecified: Secondary | ICD-10-CM

## 2022-01-30 DIAGNOSIS — R634 Abnormal weight loss: Secondary | ICD-10-CM | POA: Diagnosis present

## 2022-01-30 DIAGNOSIS — R627 Adult failure to thrive: Secondary | ICD-10-CM | POA: Diagnosis present

## 2022-01-30 DIAGNOSIS — T85528A Displacement of other gastrointestinal prosthetic devices, implants and grafts, initial encounter: Secondary | ICD-10-CM | POA: Diagnosis present

## 2022-01-30 DIAGNOSIS — G9341 Metabolic encephalopathy: Secondary | ICD-10-CM

## 2022-01-30 DIAGNOSIS — R0600 Dyspnea, unspecified: Secondary | ICD-10-CM

## 2022-01-30 DIAGNOSIS — Z955 Presence of coronary angioplasty implant and graft: Secondary | ICD-10-CM

## 2022-01-30 DIAGNOSIS — R1310 Dysphagia, unspecified: Secondary | ICD-10-CM | POA: Diagnosis present

## 2022-01-30 DIAGNOSIS — Z931 Gastrostomy status: Secondary | ICD-10-CM

## 2022-01-30 DIAGNOSIS — Y9289 Other specified places as the place of occurrence of the external cause: Secondary | ICD-10-CM

## 2022-01-30 DIAGNOSIS — E870 Hyperosmolality and hypernatremia: Principal | ICD-10-CM

## 2022-01-30 DIAGNOSIS — Z7902 Long term (current) use of antithrombotics/antiplatelets: Secondary | ICD-10-CM

## 2022-01-30 DIAGNOSIS — Y9389 Activity, other specified: Secondary | ICD-10-CM

## 2022-01-30 DIAGNOSIS — I10 Essential (primary) hypertension: Secondary | ICD-10-CM | POA: Diagnosis present

## 2022-01-30 DIAGNOSIS — E785 Hyperlipidemia, unspecified: Secondary | ICD-10-CM | POA: Diagnosis present

## 2022-01-30 DIAGNOSIS — K219 Gastro-esophageal reflux disease without esophagitis: Secondary | ICD-10-CM | POA: Diagnosis present

## 2022-01-30 DIAGNOSIS — Y998 Other external cause status: Secondary | ICD-10-CM

## 2022-01-30 DIAGNOSIS — Z66 Do not resuscitate: Secondary | ICD-10-CM | POA: Diagnosis present

## 2022-01-30 DIAGNOSIS — N179 Acute kidney failure, unspecified: Secondary | ICD-10-CM | POA: Diagnosis present

## 2022-01-30 DIAGNOSIS — R490 Dysphonia: Secondary | ICD-10-CM | POA: Diagnosis present

## 2022-01-30 DIAGNOSIS — Z681 Body mass index (BMI) 19 or less, adult: Secondary | ICD-10-CM

## 2022-01-30 DIAGNOSIS — N400 Enlarged prostate without lower urinary tract symptoms: Secondary | ICD-10-CM | POA: Diagnosis present

## 2022-01-30 DIAGNOSIS — E88A Wasting disease (syndrome) due to underlying condition: Secondary | ICD-10-CM | POA: Diagnosis present

## 2022-01-30 DIAGNOSIS — C9590 Leukemia, unspecified not having achieved remission: Secondary | ICD-10-CM

## 2022-01-30 DIAGNOSIS — F0390 Unspecified dementia without behavioral disturbance: Secondary | ICD-10-CM | POA: Diagnosis present

## 2022-01-30 DIAGNOSIS — I2510 Atherosclerotic heart disease of native coronary artery without angina pectoris: Secondary | ICD-10-CM | POA: Diagnosis present

## 2022-01-30 DIAGNOSIS — Z7982 Long term (current) use of aspirin: Secondary | ICD-10-CM

## 2022-01-30 DIAGNOSIS — Z515 Encounter for palliative care: Secondary | ICD-10-CM

## 2022-01-30 DIAGNOSIS — E43 Unspecified severe protein-calorie malnutrition: Secondary | ICD-10-CM | POA: Diagnosis present

## 2022-01-30 DIAGNOSIS — Z1152 Encounter for screening for COVID-19: Secondary | ICD-10-CM

## 2022-01-30 DIAGNOSIS — X58XXXA Exposure to other specified factors, initial encounter: Secondary | ICD-10-CM | POA: Diagnosis present

## 2022-01-30 DIAGNOSIS — Z20822 Contact with and (suspected) exposure to covid-19: Secondary | ICD-10-CM

## 2022-01-30 LAB — CBC AND DIFFERENTIAL
Baso # K/uL: 0 10*3/uL (ref 0.0–0.2)
Basophil %: 0 %
Eos # K/uL: 0 10*3/uL (ref 0.0–0.5)
Eosinophil %: 0 %
Hematocrit: 47 % (ref 37–52)
Hemoglobin: 13.5 g/dL (ref 12.0–17.0)
Lymph # K/uL: 67.2 10*3/uL — ABNORMAL HIGH (ref 1.0–5.0)
Lymphocyte %: 92 %
MCH: 29 pg (ref 26–32)
MCHC: 29 g/dL — ABNORMAL LOW (ref 32–37)
MCV: 101 fL — ABNORMAL HIGH (ref 75–100)
Mono # K/uL: 0 10*3/uL — ABNORMAL LOW (ref 0.1–1.0)
Monocyte %: 0 %
Neut # K/uL: 5.8 10*3/uL (ref 1.5–6.5)
Nucl RBC # K/uL: 0 10*3/uL (ref 0.0–0.0)
Nucl RBC %: 0 /100 WBC (ref 0.0–0.2)
Platelets: 169 10*3/uL (ref 150–450)
RBC: 4.6 MIL/uL (ref 4.0–6.0)
RDW: 14.3 % (ref 0.0–15.0)
Seg Neut %: 8 %
WBC: 73 10*3/uL — ABNORMAL HIGH (ref 3.5–11.0)

## 2022-01-30 LAB — BASIC METABOLIC PANEL
Anion Gap: 12 (ref 7–16)
CO2: 19 mmol/L — ABNORMAL LOW (ref 20–28)
Calcium: 7.7 mg/dL — ABNORMAL LOW (ref 8.6–10.2)
Chloride: 118 mmol/L — ABNORMAL HIGH (ref 96–108)
Creatinine: 1.56 mg/dL — ABNORMAL HIGH (ref 0.67–1.17)
Glucose: 859 mg/dL (ref 60–99)
Lab: 74 mg/dL — ABNORMAL HIGH (ref 6–20)
Potassium: 3.5 mmol/L (ref 3.3–4.6)
Sodium: 149 mmol/L — ABNORMAL HIGH (ref 133–145)
eGFR BY CREAT: 45 * — AB

## 2022-01-30 LAB — URINALYSIS REFLEX TO CULTURE
Blood,UA: NEGATIVE
Glucose,UA: NEGATIVE
Ketones, UA: NEGATIVE
Nitrite,UA: NEGATIVE
Specific Gravity,UA: 1.023 (ref 1.002–1.030)
pH,UA: 6 (ref 5.0–8.0)

## 2022-01-30 LAB — COMPREHENSIVE METABOLIC PANEL
ALT: 42 U/L (ref 0–50)
AST: 30 U/L (ref 0–50)
Albumin: 3.7 g/dL (ref 3.5–5.2)
Alk Phos: 98 U/L (ref 40–130)
Anion Gap: 13 (ref 7–16)
Bilirubin,Total: 0.5 mg/dL (ref 0.0–1.2)
CO2: 24 mmol/L (ref 20–28)
Calcium: 10 mg/dL (ref 8.6–10.2)
Chloride: 123 mmol/L (ref 96–108)
Creatinine: 2 mg/dL — ABNORMAL HIGH (ref 0.67–1.17)
Glucose: 135 mg/dL — ABNORMAL HIGH (ref 60–99)
Lab: 91 mg/dL — ABNORMAL HIGH (ref 6–20)
Potassium: 4.3 mmol/L (ref 3.3–4.6)
Sodium: 160 mmol/L (ref 133–145)
Total Protein: 6.5 g/dL (ref 6.3–7.7)
eGFR BY CREAT: 33 * — AB

## 2022-01-30 LAB — URINE MICROSCOPIC (IQ200)

## 2022-01-30 LAB — VENOUS BLOOD GAS
Base Excess,VENOUS: -3 (ref <=2)
Bicarbonate,VENOUS: 23 mmol/L (ref 21–28)
CO2 (Calc),VENOUS: 25 mmol/L (ref 22–31)
CO: 2.2 %
Methemoglobin: 0.3 % (ref 0.0–1.0)
PCO2,VENOUS: 45 mm Hg (ref 40–50)
PH,VENOUS: 7.33 (ref 7.32–7.42)

## 2022-01-30 LAB — PROTIME-INR
INR: 1.2 — ABNORMAL HIGH (ref 0.9–1.1)
Protime: 13.8 s — ABNORMAL HIGH (ref 10.0–12.9)

## 2022-01-30 LAB — RBC MORPHOLOGY

## 2022-01-30 LAB — COVID/INFLUENZA A & B/RSV NAAT (PCR)
COVID-19 NAAT (PCR): NEGATIVE
Influenza A NAAT (PCR): NEGATIVE
Influenza B NAAT (PCR): NEGATIVE
RSV NAAT (PCR): NEGATIVE

## 2022-01-30 LAB — DIFF MANUAL: Diff Based On: 200 CELLS

## 2022-01-30 LAB — PERFORMING LAB

## 2022-01-30 LAB — APTT: aPTT: 36.9 s (ref 25.8–37.9)

## 2022-01-30 LAB — POCT GLUCOSE: Glucose POCT: 134 mg/dL — ABNORMAL HIGH (ref 60–99)

## 2022-01-30 LAB — CK: CK: 47 U/L (ref 39–308)

## 2022-01-30 MED ORDER — DEXTROSE 50 % IV SOLN *I*
25.0000 g | INTRAVENOUS | Status: DC | PRN
Start: 2022-01-30 — End: 2022-02-05

## 2022-01-30 MED ORDER — SODIUM CHLORIDE 0.9 % IV BOLUS *I*
500.0000 mL | Freq: Once | Status: AC
Start: 2022-01-30 — End: 2022-01-30
  Administered 2022-01-30: 500 mL via INTRAVENOUS

## 2022-01-30 MED ORDER — D5W & 0.45% NACL IV SOLN *I*
100.0000 mL/h | INTRAVENOUS | Status: DC
Start: 2022-01-30 — End: 2022-01-30
  Administered 2022-01-30: 125 mL/h via INTRAVENOUS

## 2022-01-30 MED ORDER — GLUCAGON HCL (RDNA) 1 MG IJ SOLR *WRAPPED*
1.0000 mg | INTRAMUSCULAR | Status: DC | PRN
Start: 2022-01-30 — End: 2022-02-05

## 2022-01-30 MED ORDER — GLUCOSE 40 % PO GEL *I*
15.0000 g | ORAL | Status: DC | PRN
Start: 2022-01-30 — End: 2022-02-05

## 2022-01-30 MED ORDER — JUICE (FOR HYPOGLYCEMIA) *I*
120.0000 mL | ORAL | Status: DC | PRN
Start: 2022-01-30 — End: 2022-02-05

## 2022-01-30 MED ORDER — ENOXAPARIN SODIUM 30 MG/0.3ML IJ SOSY *I*
30.0000 mg | PREFILLED_SYRINGE | Freq: Every day | INTRAMUSCULAR | Status: DC
Start: 2022-01-30 — End: 2022-01-31
  Administered 2022-01-30: 30 mg via SUBCUTANEOUS
  Filled 2022-01-30: qty 0.3

## 2022-01-30 MED ORDER — SODIUM CHLORIDE 0.45 % IV SOLN *I*
250.0000 mL/h | INTRAVENOUS | Status: DC
Start: 2022-01-31 — End: 2022-01-31
  Administered 2022-01-30: 125 mL/h via INTRAVENOUS
  Administered 2022-01-31 (×2): 250 mL/h via INTRAVENOUS
  Administered 2022-01-31: 125 mL/h via INTRAVENOUS
  Administered 2022-01-31: 250 mL/h via INTRAVENOUS
  Administered 2022-01-31: 125 mL/h via INTRAVENOUS

## 2022-01-30 MED ORDER — INSULIN LISPRO (HUMAN) 100 UNIT/ML IJ/SC SOLN *WRAPPED*
10.0000 [IU] | Freq: Once | SUBCUTANEOUS | Status: DC
Start: 2022-01-31 — End: 2022-01-30

## 2022-01-30 MED ORDER — INSULIN LISPRO (HUMAN) 100 UNIT/ML IJ/SC SOLN *WRAPPED*
0.0000 [IU] | Freq: Four times a day (QID) | SUBCUTANEOUS | Status: DC
Start: 2022-01-31 — End: 2022-01-31
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
## 2022-01-30 NOTE — H&P (Addendum)
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 I obtained history through chart review and conversation with patient's daughter Larry Choi at bedside.     To review, it appears the patient has a long-standing history of CLL for >20 years, recently ibrutinib. He has been following with [facility] hematology. It appears he's been suspected to have CNS/cranial nerve involvement resulting in dysphagia/hoarsness as well as facial droop/inability to close eye. He was admitted at [facility] in July 2023 for failure to thrive with increasing weakness and falls and weight loss. He had a PEG tube placed 7/15 and was started on tube feeds. He was readmitted in August with generalized weakness. However, it appears he left AMA soon after admission and there were no major changes in the hospitalization.     I do see that on 9/13 the patient had RN home care visit and Hospice evaluation at the home. Per documentation the patient apparently declined hospice care at that time and reported did not want this service at this time.     On conversation with the patient's daughter she describes months of progressive decline for the patient. She states that since discharge with th G-tube that he's had very poor use of the tube and not tolerated tube feeds. He stopped the continuous feeds and has been giving intermittent Ensure feeds but only 2-3 per day and he has  continued to loose weight. He also administers beer through the G-tube, 1-2 drinks per day. He has stopped taking all medications aside from his Aracept. His daughter had requested hospice referral and she confirms the story described above about his declining services. She states that there have been recently increasing concerns about his ability to be cared for at home and that his wife has not been response to phone calls. Today his wife Larry Choi) had called his PCP to request re-involvement of Hospice and also to let them know that his G-tube was removed (it is unclear but the daughter suspects he may have pulled it himself) and this led to them recommending they call EMS, and he was brought to the ED.     On arrival to the ED, vital signs are T 36.3, HR 90, BP 92/55, SpO2 98% on RA. CBC shows WBC 73 (ALD 67, normal neutrophil count), Hgb and Plt normal. BMP is notable for Na 160, K 4.3, Cr 2.0. (was 1.1 on 12/06/21 in CareEverywhere).     On my conversation with the patient and his daughter he was awake and did nod his head and would communicate some with words and writing. His voice is challenging to understand which his daughter says is baseline. We discussed his goals of care and limits on his care at some length. Ultimately, I was able to gather that he is not yet sure about  hospice care, does not want to be on any of his regular medications, but is interested in treatments like IV fluids or antibiotics (if indicated). I was not able to determine if he would want to have the G-tube replaced at this point.         Past Medical History   Medical History  Past Medical History:   Diagnosis Date    Coronary artery disease     Hyperlipemia     Hypertension     Leukemia     cll    MI (myocardial infarction)        Surgical History  Past Surgical History:   Procedure Laterality Date    CORONARY ANGIOPLASTY WITH STENT PLACEMENT         Family History  No family history on file.     Social History  Social History      Socioeconomic History    Marital status: Married   Tobacco Use    Smoking status: Never    Smokeless tobacco: Never   Substance and Sexual Activity    Alcohol use: Yes     Comment: occasionally    Drug use: Never    Sexual activity: Yes     Partners: Female          Allergies  No Known Allergies (drug, envir, food or latex)    Current Medications  Prior to Admission medications    Medication Sig Start Date End Date Taking? Authorizing Provider   Ibrutinib (IMBRUVICA PO) Take 250 mg by mouth daily  PT's daughter not positive on dose.    [provider]   buPROPion [facility] SR) 150 mg 12 hr tablet Take 150 mg by mouth 3 times daily  Swallow whole. Do not crush, break, or chew.    [provider]   aspirin 325 mg tablet Take 325 mg by mouth daily    [provider]   atorvastatin (LIPITOR) 80 mg tablet Take 80 mg by mouth daily    [provider]   clopidogrel (PLAVIX) 75 mg tablet Take 75 mg by mouth daily    [provider]   donepezil (ARICEPT) 10 MG tablet Take 10 mg by mouth nightly    [provider]   finasteride (PROSCAR) 5 mg tablet Take 5 mg by mouth daily  For use by men only.    [provider]   gabapentin (NEURONTIN) 600 mg tablet Take 600-1,200 mg by mouth nightly      [provider]   lisinopril (PRINIVIL,ZESTRIL) 40 mg tablet Take 40 mg by mouth 2 times daily      [provider]   Metoprolol Succinate 50 MG CS24 Take 50 mg by mouth daily      [provider]         Review of Systems     Review of Systems   Constitutional:  Negative for chills, fever, malaise/fatigue and weight loss.   Respiratory:  Negative for cough, hemoptysis and sputum production.    Cardiovascular:  Negative for chest pain, palpitations and orthopnea.   Gastrointestinal:  Negative for blood in stool, constipation and melena.   Genitourinary:  Negative for dysuria, frequency, hematuria and urgency.   Neurological:  Negative for  dizziness, tingling and headaches.       Physical Exam     BP 126/67   Pulse 91   Temp 36.3 C (97.4 F)   Resp 14   Ht 1.753 m (5\' 9" )  SpO2 94%   BMI 24.22 kg/m     General: chronically ill appearing man, in no distress, cachectic   HEENT: dry  mucous membranes, normal dentition, EOMI, PERRL  Heart: Normal rate, regular rhythm, no murmurs  Lungs: Lungs are clear to auscultation bilaterally, no wheezing or rhonchi, normal work of breathing  Abdomen: Non-distended, non-tender to palpation throughout, G-tube excision without erythema or drainage  Lower extremities: No lower extremity edema  Skin: no rashes on extremities, abdomen, or chest  Neuro: Alert and oriented to person, place. Answering questions appropriately, will write responses with pen and paper.     Labs/Imaging     Recent Labs   Lab February 02, 2022  1843   WBC 73.0*   Hemoglobin 13.5  see below   Hematocrit 47   Platelets 169       Recent Labs   Lab 02-Feb-2022  1843   Sodium 160*   Potassium 4.3   Chloride 123*   CO2 24   UN 91*   Creatinine 2.00*   Calcium 10.0   Albumin 3.7   Total Protein 6.5   Bilirubin,Total 0.5   Alk Phos 98   ALT 42   AST 30   Glucose 135*       *Chest STANDARD single view    Result Date: 02-02-2022  No radiographic evidence of acute cardiopulmonary process. No radiographic evidence of acute osseous or superficial soft tissue thoracic injury. END OF IMPRESSION       [facility] submits this DICOM format image data and final report to the [facility], an independent secure electronic health information exchange, on a reciprocally searchable basis (with patient authorization) for a minimum of 12 months after exam date.    CT head without contrast    Result Date: 2022/02/02  No CT evidence of acute intracranial abnormality; no findings to suggest acute/subacute transcortical infarction, intracranial mass/lesion, or hemorrhage. No CT evidence of acute osseous or superficial soft tissue head injury. Mild paranasal sinus mucosal  disease. Moderate generalized parenchymal volume loss and cerebral white matter attenuation changes, as above, consistent with 04/12/2021 MRI findings. This appearance is nonspecific and is commonly associated with sequela of nonacute small vessel ischemic gliosis  in a patient this age. Additional info END OF IMPRESSION       [facility] submits this DICOM format image data and final report to the [facility], an independent secure electronic health information exchange, on a reciprocally searchable basis (with patient authorization) for a minimum of 12 months after exam date.           Assessment and Plan     ACTIVE    Diagnosis Date Noted    Hypernatremia [E87.0] 02/02/22      RESOLVED   No resolved problems to display.        Assessment:     This is an 81 year old with history of CLL with suspected CNS/cranial nerve involvement, c/b dysphagia with poor PO intake and progressive weight loss, CAD, HTN, dyslipidemia, BPH, and GERD who presents in the setting of mis-placed PEG tube.    Plan:     # Hypernatremia  # AKI  Presents for loss of PEG tube and found to be severely hyponatremic and with AKI, both likely in setting of poor free water intake even preceding loss of G-tube and now with no intake at all given lack of access.   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 Despite PEG tube for enteral access he's continued to lose weight and likely has inadequate enteral nutrition. Has had recent referral for hospice but patient declined services, and does express to me tonight wanting to continue treatments like fluids or antibiotics if needed in the hospital. However, he also does not want to continue other medications and unclear wishes regarding replacement of enteral access which will be necessary if he is not transitioning to a comfort measures only approach.   - Suspect involving palliative care would be helpful to have more in-depth discussions with patient and daughter and wife, especially as mental status hopefully improves with treatment of hypernatremia  - There additionally seem to be concerns regarding patient's adequacy of care at home with wife raised by daughter and patient (see SW note from 9/27); have been unable to reach wife tonight    # Dementia: aranesp held as no enteral access     Remainder of chronic problems likely stable and patient has previously expressed no longer wishing to take associated medications:  # CAD  # HTN  # BPH  # GERD    Medication Reconciliation reviewed Yes  Diet:NPO  IVF: 1/2 NS at 100cc/hr  DVT prophylaxis:Lovenox  Code Status/GOC: full code  Family update/concerns: Larry Choi at bedside  EDD:    Mobility Plan / Ability: N/A and Full weight bearing  Type and Frequency of Labs: daily + as stated above      Reason for hospitalization: Risk of decompensation    ELOS: Greater than 2 midnights    Signed:  Feliberto Harts, MD  01/30/2022  8:45 PM

## 2022-01-30 NOTE — ED Provider Notes (Signed)
History     Chief Complaint   Patient presents with    Gastrostomy Tube Problems     81 year old male presenting to the ED for evaluation of dislodged G-tube.  Per EMS report, patient also needs more services at home.  There is concern about wife's ability to take care of him.  History from the patient is severely limited, he is able to answer some yes or no questions but not with any significant consistency, history is quite limited.  Wife is not here at bedside.    Per chart review patient has a history of CLL, has history of failure to thrive and weight loss, G-tube was placed at outside hospital in mid July.          Medical/Surgical/Family History     Past Medical History:   Diagnosis Date    Coronary artery disease     Hyperlipemia     Hypertension     Leukemia     cll    MI (myocardial infarction)         Patient Active Problem List   Diagnosis Code    Acute hypoxemic respiratory failure due to COVID-19 U07.1, J96.01    Leukemia C95.90    Coronary artery disease I25.10    Essential hypertension I10    Acute kidney injury N17.9    Hyponatremia E87.1    Personal history of CLL (chronic lymphocytic leukemia) Z85.6    Pneumonia due to COVID-19 virus U07.1, J12.82    COVID-19 U07.1    Lymphadenopathy R59.1    BPH (benign prostatic hyperplasia) N40.0            Past Surgical History:   Procedure Laterality Date    CORONARY ANGIOPLASTY WITH STENT PLACEMENT            Social History     Tobacco Use    Smoking status: Never    Smokeless tobacco: Never   Substance Use Topics    Alcohol use: Yes     Comment: occasionally    Drug use: Never             Review of Systems    Physical Exam     Triage Vitals     First Recorded BP: 92/55, Resp: 14, Temp: 36.3 C (97.4 F) Oxygen Therapy SpO2: 98 %, O2 Device: None (Room air), Heart Rate: 90, (01/21/2022 1605) Heart Rate (via Pulse Ox): 90, (02/01/2022 1605).      Physical Exam  Vitals and nursing note reviewed.   Constitutional:       Comments: Arouses to voice,  tracks  Cachectic appearing   HENT:      Mouth/Throat:      Mouth: Mucous membranes are dry.   Eyes:      Conjunctiva/sclera: Conjunctivae normal.   Cardiovascular:      Pulses: Normal pulses.   Pulmonary:      Effort: Pulmonary effort is normal.      Breath sounds: Normal breath sounds.   Abdominal:      General: There is no distension.      Palpations: Abdomen is soft.      Tenderness: There is no abdominal tenderness.      Comments: Left sided G tube site, granulation tissue present   Skin:     General: Skin is warm and dry.      Capillary Refill: Capillary refill takes less than 2 seconds.   Neurological:      Comments: Left facial droop, moves all  ext, speech is slurred         Medical Decision Making   Patient seen by me on:  01/13/2022    Assessment:  81 year old male presenting to the ED for evaluation of generalized weakness, G-tube dislodgment.  Social work has been in contact with the patient's family and states there are also a number of social issues at home.  I attempted to call family and did not reach anyone, wife and daughter did not answer for me.    Differential diagnosis:  Dehydration, failure to thrive, less likely infection, pneumonia, encephalopathy, UTI, metabolic encephalopathy, renal failure  Patient does have a left-sided facial droop but he is accompanied by his driver's license which appears that the facial droop is not new.    Plan:  We will attempt to replace G-tube  Basic labs only CBC, CMP, IV fluid hydration, urinalysis    ED Course and Disposition:  CBC with leukocytosis, patient has CLL and while this may be infectious, he is afebrile and has chronic leukocytosis, will monitor.  CMP is significantly abnormal with a sodium of 160, chloride of 123.  He also has an acute kidney injury with an elevated BUN and creatinine of 2.0.  IV fluid hydration started.  I attempted to replace patient's G-tube but was unable to do so, the track is only 2 months old and I do not know how long the  G-tube was out for, will require IR consultation if we are to replace this G-tube.  Discussed with hospital medicine, will continue IV fluid hydration and supportive care, will continue social work consultation.         ED Course as of 01/17/2022 1730   Wed Jan 30, 2022   1621 Per Care everywhere--PEG tube placed 11/17/2021   1725 Attempted to place G tube, unable. Attempted to pass a 21F foley, unable to pass, too much resistance.       Reubin Milan, MD            Reubin Milan, MD  01/20/2022 2236

## 2022-01-30 NOTE — Progress Notes (Signed)
PATIENT MEDICATION HISTORY      I reviewed patient's prior to admission (PTA) medications. Their eRec PTA med list has been updated.         Updated Prior to Admission medication list is below:    Prior to Admission Medications       Prescriptions Last Dose Informant Patient Reported? Taking?    donepezil (ARICEPT) 10 MG tablet 01/29/2022  Yes Yes    Take 1 tablet (10 mg total) by mouth 2 times daily                Kathrene Bongo, PharmD

## 2022-01-30 NOTE — ED Triage Notes (Signed)
Pt comes to ED via ambulance with g tube dislodged. Pt reports to EMS that he needs more services.      Prehospital medications given: No

## 2022-01-31 DIAGNOSIS — Z856 Personal history of leukemia: Secondary | ICD-10-CM

## 2022-01-31 LAB — CBC AND DIFFERENTIAL
Baso # K/uL: 0 10*3/uL (ref 0.0–0.2)
Basophil %: 0 %
Eos # K/uL: 0.3 10*3/uL (ref 0.0–0.5)
Eosinophil %: 0.5 %
Hematocrit: 38 % (ref 37–52)
Hemoglobin: 10.7 g/dL — ABNORMAL LOW (ref 12.0–17.0)
Lymph # K/uL: 54 10*3/uL — ABNORMAL HIGH (ref 1.0–5.0)
Lymphocyte %: 90 %
MCH: 29 pg (ref 26–32)
MCHC: 29 g/dL — ABNORMAL LOW (ref 32–37)
MCV: 103 fL — ABNORMAL HIGH (ref 75–100)
Mono # K/uL: 0.3 10*3/uL (ref 0.1–1.0)
Monocyte %: 0.5 %
Neut # K/uL: 5.4 10*3/uL (ref 1.5–6.5)
Nucl RBC # K/uL: 0 10*3/uL (ref 0.0–0.0)
Nucl RBC %: 0 /100 WBC (ref 0.0–0.2)
Platelets: 126 10*3/uL — ABNORMAL LOW (ref 150–450)
RBC: 3.7 MIL/uL — ABNORMAL LOW (ref 4.0–6.0)
RDW: 14.4 % (ref 0.0–15.0)
Seg Neut %: 9 %
WBC: 60 10*3/uL — ABNORMAL HIGH (ref 3.5–11.0)

## 2022-01-31 LAB — BASIC METABOLIC PANEL
Anion Gap: 12 (ref 7–16)
Anion Gap: 11 (ref 7–16)
CO2: 23 mmol/L (ref 20–28)
CO2: 22 mmol/L (ref 20–28)
Calcium: 9.7 mg/dL (ref 8.6–10.2)
Calcium: 9 mg/dL (ref 8.6–10.2)
Chloride: 126 mmol/L (ref 96–108)
Chloride: 128 mmol/L (ref 96–108)
Creatinine: 1.89 mg/dL — ABNORMAL HIGH (ref 0.67–1.17)
Creatinine: 1.71 mg/dL — ABNORMAL HIGH (ref 0.67–1.17)
Glucose: 142 mg/dL — ABNORMAL HIGH (ref 60–99)
Glucose: 134 mg/dL — ABNORMAL HIGH (ref 60–99)
Lab: 85 mg/dL — ABNORMAL HIGH (ref 6–20)
Lab: 81 mg/dL — ABNORMAL HIGH (ref 6–20)
Potassium: 4.2 mmol/L (ref 3.3–4.6)
Potassium: 4.2 mmol/L (ref 3.3–4.6)
Sodium: 161 mmol/L (ref 133–145)
Sodium: 161 mmol/L (ref 133–145)
eGFR BY CREAT: 35 * — AB
eGFR BY CREAT: 40 * — AB

## 2022-01-31 LAB — DIFF MANUAL: Diff Based On: 200 CELLS

## 2022-01-31 LAB — HEMATOPATHOLOGY REVIEW

## 2022-01-31 LAB — POCT GLUCOSE: Glucose POCT: 129 mg/dL — ABNORMAL HIGH (ref 60–99)

## 2022-01-31 MED ORDER — LORAZEPAM 1 MG PO TABS *I*
1.0000 mg | ORAL_TABLET | ORAL | Status: AC | PRN
Start: 2022-01-31 — End: 2022-02-03

## 2022-01-31 MED ORDER — ALBUTEROL SULFATE (2.5 MG/3ML) 0.083% IN NEBU *I*
2.5000 mg | INHALATION_SOLUTION | RESPIRATORY_TRACT | Status: DC | PRN
Start: 2022-01-31 — End: 2022-02-05

## 2022-01-31 MED ORDER — HYOSCYAMINE SULFATE 0.125 MG SL SUBL *I*
125.0000 ug | SUBLINGUAL_TABLET | Freq: Four times a day (QID) | SUBLINGUAL | Status: DC | PRN
Start: 2022-01-31 — End: 2022-02-05

## 2022-01-31 MED ORDER — LORAZEPAM 2 MG/ML IJ SOLN *I*
1.0000 mg | INTRAMUSCULAR | Status: AC | PRN
Start: 2022-01-31 — End: 2022-02-03

## 2022-01-31 MED ORDER — ONDANSETRON 4 MG PO TBDP *I*
4.0000 mg | ORAL_TABLET | ORAL | Status: DC | PRN
Start: 2022-01-31 — End: 2022-02-05

## 2022-01-31 MED ORDER — DEXTROSE 5 % IV SOLN WRAPPED *I*
3.0000 mL/kg/h | INTRAVENOUS | Status: DC
Start: 2022-01-31 — End: 2022-01-31
  Administered 2022-01-31: 3 mL/kg/h via INTRAVENOUS

## 2022-01-31 MED ORDER — ARTIFICIAL TEARS (POLYVINYL ALCOHOL) 1.4 % OP SOLN *I*
1.0000 [drp] | OPHTHALMIC | Status: DC | PRN
Start: 2022-01-31 — End: 2022-02-05

## 2022-01-31 MED ORDER — MORPHINE SULFATE 20 MG/ML PO SOLN *I*
5.0000 mg | ORAL | Status: AC | PRN
Start: 2022-01-31 — End: 2022-02-03
  Administered 2022-02-02 – 2022-02-03 (×2): 5 mg via ORAL
  Filled 2022-01-31 (×2): qty 0.25

## 2022-01-31 NOTE — Progress Notes (Signed)
01/31/22 1000   [facility] Patient Class Review   Patient Class Review Inpatient     Patient Class effective 01/13/2022

## 2022-01-31 NOTE — Progress Notes (Signed)
Family asked to meet with writer, SW met with Larry Choi, daughter Larry Choi, and grand daughter Larry Choi to discuss options for discharge.  Larry Choi has elected, per daughter, for comfort care.  Daughter asking about hospice home, discussed need to meet prognosis for comfort care homes and provided them with a list of comfort care homes to consider.  Also discussed SNF placement.  Provided them with application and facility choice list.  Currently Larry Choi had a g tube in that is currently not in place.  He is NPO, thin.  Continue to follow and assist with discharge planning needs.      Dorita Fray, SW  [phone]
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
## 2022-01-31 NOTE — ED Notes (Signed)
Pt foley draining well, inc small amount of soft BM, pt was playing with the stool and throwing it on the floor, CARES performed, pt repositioned in bed and VS taken.

## 2022-01-31 NOTE — ED Notes (Signed)
Assumed care, awaiting transfer to inpt bed assignment.  Pt in no acute distress, appears comfortable.  Will monitor until transfer.

## 2022-01-31 NOTE — Progress Notes (Signed)
Patient's care status has changed to comfort care.    Patient's current diet order is none; will provide diet as selected, as desired.  Please consult if patient's care status changes or further recommendations are needed.    P: No further clinical nutrition services. Preferences will be addressed via Nutrition Services- [employer].            Polo Riley MS, RD, CD-N, CNSC  [phone]

## 2022-02-01 NOTE — Progress Notes (Signed)
Daily Progress Note  Hospital Medicine      Patient Name: Larry Choi  Date of Birth: [birth year]  MRN#: [id number]  Admission Date: Feb 08, 2022  Date of Service: 02/01/2022      Interval History  Patient seen and examined, reviewed event and data. Patient wishes to use alcohol to help his phlegm, states it usually helps him at home. States that at home he would usually tolerate crushed grapes, pudding, some ice cream. Patient wishes to be able to shower and get cleaned up.      Physical Exam:  Vital Signs:  BP (!) 142/91 (BP Location: Left arm)   Pulse 70   Temp 36.8 C (98.2 F) (Temporal)   Resp 17   Ht 1.753 m (5\' 9" )   Wt 48.1 kg (106 lb 1.6 oz)   SpO2 96%   BMI 15.67 kg/m     General: Severely malnourished patient, cachetic appearing, frail   HEENT: NCAT; left sided facial paralysis noted (baseline)  Cardiovascular: Regular rate and rhythm, no murmurs  Pulmonary/Chest: Normal respiratory effort  Abdominal: soft, NT, ND; G-tube site with no erythema or drainage noted  Extremity: no clubbing, edema, or cyanosis.  Neurologic: awake, alert, oriented x 3; answering questions appropriately, provides history; muffled voice (baseline), communicates with writing     Data (Lab, Microbiology, Imaging Results):    Personally reviewed and are significant for:  - No new labs    Hospital Problem List  Active Hospital Problems    Diagnosis    Hypernatremia      Resolved Hospital Problems   No resolved problems to display.        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 In the ED, the patient was found to by severely hypernatremic with an acute kidney injury. Patient was admitted to [facility] for further evaluation and management. Overall, the  patient has had progressive decline driven primarily by malnutrition related to dysphagia. Patient had recent referral to Hospice as outpatient, but supposedly declined services. Additionally, there are concerns for patient's care at home with wife. Patient eventually opted to stop aggressive medical treatments and transitioned to comfort care measures only.     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 Despite PEG tube for enteral access he's continued to lose weight and likely has inadequate enteral nutrition. Previously declined Hospice referral.   - Discussed overall decline and poor prognosis with patient. Despite a diagnosis of dementia, patient is awake, alert, oriented x 3 and has capacity to make his own medical decisions. Able to comprehend risks/benefits. As such, patient opted for comfort care measures only on 9/28  - MOLST form completed at bedside with patient to reflect: DNR/DNI, do not send to the hospital, no feeding tube, no IVF, no dialysis, no antibiotics  - Given patient's dysphagia and poor PO intake and now with no feeding tube, patient's prognosis is likely less than 3 months.   - SW consulted and following for placement. Home with Hospice is not a viable option.     Hypernatremia, acute kidney injury POA  - Likely in setting of  poor free water intake even preceding loss of G-tube and now with no intake at all given lack of access.   - CCM only as of 9/28     Dementia:   - Aranesp held as no enteral access   - Although patient with diagnosis with dementia, patient is AAOx 3 and has capacity to make his own medical decisions as discussed above.      Remainder of chronic problems as follows and patient has previously expressed no longer wishing to take associated medications:  CAD  HTN  BPH  GERD  Hx of Ramsey Hunt  syndrome    Medication Reconciliation: Yes  Fluid/Electrolytes/Nutrition or Diet: Regular, as tolerated  Mobility: as tolerated  Indication for Lines/Tubes/Foley catheter/Telemetry: Foley for comfort   DVT prophylaxis:Not appropriate / not consistent with goals of care  Smoking Cessation: NA  Code Status/GOC:  DNR/DNI, comfort care measures only  Family update/concern: Daughter Gentry Fitz and son Selena Batten updated at bedside   Disposition Barriers: Will need placement to SNF or [facility], home with Hospice is not a viable options      (EDD): Medically Ready for Discharge Date: 02/01/22      Plan and orders for day reviewed with nursing? Yes.    Author:   Efrain Sella, MD   02/01/2022  5:41 PM

## 2022-02-01 NOTE — Continuity of Care (Signed)
NEW [facility] DEPARTMENT OF HEALTH  [facility]-Division of Quality and Surveillance for Nursing Homes and ICFs/MR  Patient Name  Larry Choi MR Number  [id number] Account Number   Birthdate  [id number]    [facility] and Community                Patient Review Instrument  (HC-PRI)               RUG II:  CC7     I. ADMINISTRATIVE DATA     1. Operating Certificate Number  [id number] 2. Social Security Number  [id number]   3. Official Name of Hospital Completing this Review  [facility]    4A. Patient Name  Larry Choi 10. Sex  male   4B. [place] of Residence  [place] 11A. Date of Hospital Admission or Initial Agency Visit  01/30/2022   5. Date of [facility] Completion  February 01, 2022 11B. Date of Alternate level of Care Status in Hospital (if applicable)    6. Account Number   12. Medicaid Number     7. Hospital Room Number  T3E-320/T3E-320-01 13. Medicare Number  [id number]   8. Name of Unit/Division/Building  T3E-320/T3E-320-01 14. Primary Payor  MEDICARE   9. Date of Birth  [phone] 29. Reason for [facility] Completion  1 - RHCF application from hospital     II. MEDICAL EVENTS     16. Decubitus Level:        Location:  No reddened skin or breakdown     17. MEDICAL CONDITIONS During the past week:  1=Yes  2=No 18. MEDICAL TREATMENTS 1=Yes  2=No   A. Comatose No A. Tracheostomy Care/Suct No         B. Dehydration No B. Suctioning/General No         C. Internal Bleeding No C. Oxygen (Daily) No              D. Stasis Ulcer No D Respiratory Care (Daily) No         E. Terminally Ill Yes E. Nasal Gastric Feeding No         F. Contractures No F. Parenteral Feeding No         G. Diabetes Mellitus No G. Wound Care No         H. Urinary Tract Infection No H. Chemotherapy No         I. HIV Infection Symptomatic No I. Transfusion No         J. Accident No J. Dialysis No         K. Ventilator Dependent No K. Bowel and Bladder Rehab No           L. Catheter (Indwelling or External) Yes (indwelling d/c'd 02/01/2022)              M. Physical  Restraints  (Daytime) No             Adapted from [id number]    Version: 002F  Review Type: Update review  02/01/2022  2 of 3    NEW [facility] DEPARTMENT OF HEALTH  [facility]-Division of Quality and Surveillance for Nursing Homes and ICFs/MR  Patient Name  Larry Choi MR Number  [id number] Account Number   Birthdate  [id number]    [facility] and Community                Patient Review Instrument  (HC-PRI)  III. Activities of Daily Living     19. Eating 3-Requires continual help (encouragement/teaching/physical assistance) with eating or meal will not be completed.    20. Mobility 5-Is wheeled, chairfast or bedfast. Relies on someone else to move about, if at all.    21. Transfer 4-Requires two people to provide constant supervision and/or physically lift.  May need lifting equipment.    22. Toileting 4-Incontinent of bowel and/or bladder and is not taken to a bathroom.      IV. BEHAVIORS     23. Verbal Disruption No known history   24. Physical Agression No known history   25. Disruptive, Infantile or Socially Inappropriate Behavior No known history   26. Hallucinations No     V. SPECIALIZED SERVICES     27A. Physical Therapy  27B. Occupational Therapy    Level: Does not receive. Level: Does not receive.   Actual days/week:    Actual days/week::      Actual hours/week:    Actual hours/week:        28. Number of Physician Visits - 0     VI. DIAGNOSIS     29. Primary Problem: <principal problem not specified>     VII. PLAN OF CARE SUMMARY     30. Primary Diagnosis: Encounter Diagnoses   Name Primary?    Hypernatremia Yes    Dislodged gastrostomy tube     Metabolic encephalopathy             PMH: See below         PSH:  has a past surgical history that includes Coronary angioplasty with stent.   31A. Rehabilitation Potential:  .     Marland Kitchen                 Comment:    31B. Current therapy care plan:              Comment:    32. Medications: See below   33A. Allergies: Allergies: No Known Allergies (drug, envir, food or latex)    33B: Treatments:    33C: Abnormal Labs: See below   33D: Precautions:             Comment:    33E: Pacemaker     74F: Diet: Diet Easy to Chew (Level 7 EC)          34. Race/Ethnic Group White or Caucasian [1]     35. Certification    I have personally observed/interviewed this patient and completed this H/C PRI. No - Staff/Chart           I certify that the information contained herein is a true abstract of this patient's condition and medical record. Yes   Certified Assessor: Catha Gosselin, RN   Identification Number: [id number]             Adapted from [id number]    Version: 002F  Review Type: Update review  02/01/2022  3 of 3    PMH:    Past Medical History:   Diagnosis Date    Coronary artery disease     Hyperlipemia     Hypertension     Leukemia     cll    MI (myocardial infarction)        Medications:    Current Facility-Administered Medications   Medication Dose Route Frequency    albuterol (PROVENTIL) nebulization 2.5 mg  2.5 mg Nebulization Q2H PRN    morphine 20 mg/mL concentrated solution 5 mg  5 mg Oral Q4H PRN    hyoscyamine (LEVSIN/SL) SL tablet 125 mcg  125 mcg Sublingual Q6H PRN    artificial tears (polyvinyl alcohol) ophthalmic solution 1.4%  1 drop Both Eyes PRN    ondansetron (ZOFRAN-ODT) disintegrating tablet 4 mg  4 mg Oral Q4H PRN    LORazepam (ATIVAN) tablet 1 mg  1 mg Oral Q4H PRN    Or    LORazepam (ATIVAN) 2 mg/mL injection 1 mg  1 mg Intravenous Q4H PRN    Or    LORazepam (ATIVAN) 2 mg/mL injection 1 mg  1 mg Intramuscular Q4H PRN    Juice (for hypoglycemia) 120 mL  120 mL Oral PRN    dextrose (GLUTOSE) 40 % oral gel 15 g of Glucose  15 g of Glucose Oral PRN    dextrose 50% (0.5 g/mL) injection 25 g  25 g Intravenous PRN    glucagon (GLUCAGEN) injection 1 mg  1 mg Intramuscular PRN       Abnormal Labs:    Recent Results (from the past 72 hour(s))   Protime-INR    Collection Time: 02-21-2022  6:43 PM   Result Value Ref Range    Protime 13.8 (H) 10.0 - 12.9 sec    INR 1.2 (H) 0.9 - 1.1   CBC and  differential    Collection Time: 21-Feb-2022  6:43 PM   Result Value Ref Range    WBC 73.0 (H) 3.5 - 11.0 THOU/uL    RBC 4.6 4.0 - 6.0 MIL/uL    Hemoglobin 13.5 12.0 - 17.0 g/dL    Hematocrit 47 37 - 52 %    MCV 101 (H) 75 - 100 fL    MCH 29 26 - 32 pg    MCHC 29 (L) 32 - 37 g/dL    RDW 45.4 0.0 - 09.8 %    Platelets 169 150 - 450 THOU/uL    Seg Neut % 8.0 %    Lymphocyte % 92.0 %    Monocyte % 0.0 %    Eosinophil % 0.0 %    Basophil % 0.0 %    Neut # K/uL 5.8 1.5 - 6.5 THOU/uL    Lymph # K/uL 67.2 (H) 1.0 - 5.0 THOU/uL    Mono # K/uL 0.0 (L) 0.1 - 1.0 THOU/uL    Eos # K/uL 0.0 0.0 - 0.5 THOU/uL    Baso # K/uL 0.0 0.0 - 0.2 THOU/uL    Nucl RBC % 0.0 0.0 - 0.2 /100 WBC    Nucl RBC # K/uL 0.0 0.0 - 0.0 THOU/uL   APTT    Collection Time: 02/21/2022  6:43 PM   Result Value Ref Range    aPTT 36.9 25.8 - 37.9 sec   Comprehensive metabolic panel    Collection Time: February 21, 2022  6:43 PM   Result Value Ref Range    Sodium 160 (HH) 133 - 145 mmol/L    Potassium 4.3 3.3 - 4.6 mmol/L    Chloride 123 (HH) 96 - 108 mmol/L    CO2 24 20 - 28 mmol/L    Anion Gap 13 7 - 16    UN 91 (H) 6 - 20 mg/dL    Creatinine 1.19 (H) 0.67 - 1.17 mg/dL    eGFR BY CREAT 33 (!) *    Glucose 135 (H) 60 - 99 mg/dL    Calcium 14.7 8.6 - 82.9 mg/dL    Total Protein 6.5 6.3 - 7.7 g/dL    Albumin 3.7  3.5 - 5.2 g/dL    Bilirubin,Total 0.5 0.0 - 1.2 mg/dL    AST 30 0 - 50 U/L    ALT 42 0 - 50 U/L    Alk Phos 98 40 - 130 U/L   Venous blood gas    Collection Time: 01/30/22  6:43 PM   Result Value Ref Range    Hemoglobin see below 12.0 - 17.0 g/dL    PH,VENOUS 4.54 0.98 - 7.42    PCO2,VENOUS 45 40 - 50 mm Hg    PO2,VENOUS CANCELED 25 - 43 mm Hg    Bicarbonate,VENOUS 23 21 - 28 mmol/L    Base Excess,VENOUS -3 -3 - 2    CO2 (Calc),VENOUS 25 22 - 31 mmol/L    FO2 HB,VENOUS CANCELED 63 - 83 %    O2 SAT (Calc) CANCELED 63 - 83 %    CO 2.2 %    Methemoglobin 0.3 0.0 - 1.0 %   COVID/Influenza A & B/RSV NAAT (PCR)    Collection Time: 01/30/22  6:43 PM   Result Value Ref Range     COVID-19 Source  Nasopharyngeal     COVID-19 NAAT (PCR) NEGATIVE NEG    Influenza A NAAT (PCR) NEGATIVE Negative    Influenza B NAAT (PCR) NEGATIVE Negative    RSV NAAT (PCR) NEGATIVE Negative   Diff manual    Collection Time: 01/30/22  6:43 PM   Result Value Ref Range    Manual DIFF RESULTS     Diff Based On 200 CELLS   RBC morphology    Collection Time: 01/30/22  6:43 PM   Result Value Ref Range    RBC Morphology Present     Anisocytosis Present (!)     Macrocytes Present (!)    Hematopathology Review    Collection Time: 01/30/22  6:43 PM   Result Value Ref Range    Hematopathology Review see below    Performing Lab    Collection Time: 01/30/22  6:43 PM   Result Value Ref Range    Performing Lab see below    CK    Collection Time: 01/30/22  6:43 PM   Result Value Ref Range    CK 47 39 - 308 U/L   Urinalysis reflex to culture    Collection Time: 01/30/22  9:17 PM   Result Value Ref Range    Color, UA Yellow Yellow-Dk Yellow    Appearance,UR Clear Clear    Specific Gravity,UA 1.023 1.002 - 1.030    Leuk Esterase,UA Trace (!) NEGATIVE    Nitrite,UA NEG NEGATIVE    pH,UA 6.0 5.0 - 8.0    Protein,UA 1+ (!) NEGATIVE    Glucose,UA NEG NEGATIVE    Ketones, UA NEG NEGATIVE    Blood,UA NEG NEGATIVE   Urine microscopic (iq200)    Collection Time: 01/30/22  9:17 PM   Result Value Ref Range    RBC,UA 0-2 0 - 2 /hpf    WBC,UA 0-5 0 - 5 /hpf    Bacteria,UA 4+ (!) None Seen - 1+    Hyaline Casts,UA 0-5 0 - 5 /lpf    Squam Epithel,UA 1+ 0-1+ /lpf    Yeast,UA Present Not Present   Basic metabolic panel    Collection Time: 01/30/22 10:24 PM   Result Value Ref Range    Glucose 859 (HH) 60 - 99 mg/dL    Sodium 119 (H) 147 - 145 mmol/L    Potassium 3.5 3.3 - 4.6 mmol/L  Chloride 118 (H) 96 - 108 mmol/L    CO2 19 (L) 20 - 28 mmol/L    Anion Gap 12 7 - 16    UN 74 (H) 6 - 20 mg/dL    Creatinine 0.45 (H) 0.67 - 1.17 mg/dL    eGFR BY CREAT 45 (!) *    Calcium 7.7 (L) 8.6 - 10.2 mg/dL   POCT glucose    Collection Time: 2022-02-08 11:26  PM   Result Value Ref Range    Glucose POCT 134 (H) 60 - 99 mg/dL   Basic metabolic panel    Collection Time: 01/31/22  3:52 AM   Result Value Ref Range    Glucose 142 (H) 60 - 99 mg/dL    Sodium 409 (HH) 811 - 145 mmol/L    Potassium 4.2 3.3 - 4.6 mmol/L    Chloride 126 (HH) 96 - 108 mmol/L    CO2 23 20 - 28 mmol/L    Anion Gap 12 7 - 16    UN 85 (H) 6 - 20 mg/dL    Creatinine 9.14 (H) 0.67 - 1.17 mg/dL    eGFR BY CREAT 35 (!) *    Calcium 9.7 8.6 - 10.2 mg/dL     *Note: Due to a large number of results and/or encounters for the requested time period, some results have not been displayed. A complete set of results can be found in Results Review.           LDAs:    Lines/Drains/Airways/Wounds       Line  Duration             Peripheral IV 02-08-2022 1730 Left Forearm (distal third) 2 days

## 2022-02-01 NOTE — Progress Notes (Signed)
Writer entered patient room to find a half empty bottle of beer, full bottle of [place] and full bottle of Apple Pear Brandy at bedside (all out of patient reach).  Patient also had a small spray bottle at bedside table, writer noted the smell of alcohol when opened.  Patient states he uses the alcohol spray to dry out his mucus.    Writer disposed of the remaining beer down the sink , Notified security Jillyn Hidden) to come take liquor bottles.    Erenest Rasher, RN

## 2022-02-01 NOTE — Progress Notes (Signed)
SPIRITUAL CARE NOTE      Patient was in bed as wound care RN was finishing care. Patient welcomed chaplain into the room, he offered a life review that indicated a heart that desired to help individuals, love well and to do good. Chaplain offered reflective listening as affirmation of his life was given. Silent presence honored his journey and patient continued to extend a Financial controller" with chaplain as he shared his sorrow, the discomfort of the phlegm in his through that his spray alcohol helps with. Meaning making and reconciliation was explored. Prayer was offered thanking the Divine for life spent per patient, asking for comfort and peace. Patient expressed appreciation for "seeing" him.     Reason for Encounter/Background  SW requested consult. Patient reports a [place] background with a belief in a compassionate Divine.     Spiritual Assessment  Patient expressed acceptance, powerlessness and sorrow. Chaplain offered supports as listed above.     Plan of Care  Visit details shared with RN Romie Minus and writer visited briefly with patients family who was outside of the room at the time of exit.     Rev. Elvis Coil Meliyah Simon M.Div., RN, ORDM  Manager of [facility] [place]  C: [phone]  O: [phone]       02/01/22 1100   Clinical Encounter Type   Visited With Patient;Family   Visit Type Referral;Intro   Encounter Type Spiritual support   Referral From Social Work   Referral To Chaplain   Patient Spiritual Encounters   Identified Religion Lutheran   Patient's Concept of God/Higher Power Compassionate   Spiritual Concern Powerlessness   Grief Coping 3   Patient Coping Accepting;Sadness;Open discussion   Care Provided   Chaplain Interventions Reflective listening;Normalized feelings;Life review;Prayer   Time Calculation   Start Time [phone]   Stop Time 0935   Time Calculation 25   Chaplain Consult Complete   Chaplain consult complete Yes

## 2022-02-01 NOTE — Progress Notes (Signed)
Met with pt and family today.  They are working on paperwork for SNF, and will Archivist of hospice choices.  SW to follow, support provided.  Pt remains NPO, no intake at all.     Dorita Fray, SW  [phone]

## 2022-02-02 LAB — HEMATOPATHOLOGY REVIEW

## 2022-02-02 MED ORDER — WITCH HAZEL-GLYCERIN EX PADS *I* WRAPPED
MEDICATED_PAD | CUTANEOUS | Status: DC | PRN
Start: 2022-02-02 — End: 2022-02-05
  Filled 2022-02-02: qty 40

## 2022-02-02 NOTE — Progress Notes (Signed)
Daily Progress Note  Hospital Medicine      Patient Name: Larry Choi  Date of Birth: 21-Nov-1940  MRN#: [id number]  Admission Date: 01/19/2022  Date of Service: 02/02/2022      Interval History  Patient seen and examined, reviewed event and data   Ill-appearing, lying flat in bed and coughing trying to clear her secretions, when I lifted the bed a big piece of scrambled egg came out of his mouth.  Was able to breathe much better after that.      Physical Exam:  Vital Signs:  BP (!) 142/91 (BP Location: Left arm)   Pulse 70   Temp 36.8 C (98.2 F) (Temporal)   Resp 17   Ht 1.753 m (5\' 9" )   Wt 48.1 kg (106 lb 1.6 oz)   SpO2 96%   BMI 15.67 kg/m     Physical Exam  Constitutional:       Appearance: He is ill-appearing.   Cardiovascular:      Rate and Rhythm: Normal rate and regular rhythm.      Pulses: Normal pulses.      Heart sounds: Normal heart sounds.   Pulmonary:      Breath sounds: Rhonchi present.   Abdominal:      Palpations: Abdomen is soft.   Musculoskeletal:      Right lower leg: No edema.      Left lower leg: No edema.   Skin:     General: Skin is dry.      Coloration: Skin is pale.      Findings: Bruising present.   Neurological:      Mental Status: He is disoriented.      Motor: Weakness present.          Current MEDS    Scheduled                                                                                                                                                                                                                                    Infusions        Data (Lab, Microbiology, Imaging Results):    Personally reviewed and are significant for : n/a          Hospital Problem List  Active Hospital Problems    Diagnosis    Hypernatremia      Resolved Hospital Problems   No resolved problems to display.  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 In the ED, the patient was found to by severely hypernatremic with an acute kidney injury.  He has been on a descending trend and due to dysphagia has severe malnutrition, referral to hospice already initiated but he declined services as outpatient and did not want to rely on his wife at home and opted for transition to comfort care measures here in the hospital.    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 Despite PEG tube for enteral access he's continued to lose weight and likely has inadequate enteral nutrition. Previously declined Hospice referral.   - Discussed overall decline and poor prognosis with patient. Despite a diagnosis of dementia, patient is awake, alert, oriented x 3 and has capacity to make his own medical decisions. Able to comprehend risks/benefits.  Opted for comfort care measures on 9/28 and form completed today to reflect wishes  : DNR/DNI, do not send to the hospital, no feeding tube, no IVF, no dialysis, no antibiotics  - Given patient's dysphagia and poor PO intake and now with no feeding tube, patient's prognosis is likely less than 3 months.   - SW consulted and following for placement. Home with Hospice is not a viable option.      Hypernatremia, acute kidney injury POA  - Likely in setting of poor free water intake even preceding loss of G-tube and now with no intake at all given lack of access.   - CCM only as of 9/28      Dementia:   - Aranesp held as no enteral access   - Although patient with diagnosis with dementia, patient is AAOx 3 and has capacity to make his own medical decisions as discussed above.     Has multiple lower comorbidities for which medical therapy was stopped  Coronary artery  disease  Hypertension  GERD  BPH  History of Ramsay Hunt syndrome    Medication Reconciliation: Yes  Fluid/Electrolytes/Nutrition or Diet: Liberal diet  Mobility: As tolerated  Indication for Lines/Tubes/Foley catheter/Telemetry: Foley for comfort  DVT prophylaxis:Not appropriate / not consistent with goals of care  Smoking Cessation: NA  Code Status/GOC:  DNR/DNI, comfort care measures only   Family update/concern: Family updated yesterday  Disposition Barriers: Will need placement to SNF or [facility], home with Hospice is not a viable options       (EDD): Medically Ready for Discharge Date: 02/01/22      Plan and orders for day reviewed with nursing? Yes.    Author:   Aaron Mose, MD   02/02/2022  3:44 PM

## 2022-02-03 MED ORDER — GLYCERIN (ADULT) 2 GM RE SUPP *I*
1.0000 | Freq: Once | RECTAL | Status: AC
Start: 2022-02-03 — End: 2022-02-03
  Administered 2022-02-03: 1 via RECTAL
  Filled 2022-02-03: qty 12

## 2022-02-03 MED ORDER — MORPHINE SULFATE 20 MG/ML PO SOLN *I*
5.0000 mg | ORAL | Status: DC | PRN
Start: 2022-02-03 — End: 2022-02-04
  Administered 2022-02-03 – 2022-02-04 (×3): 5 mg via ORAL
  Filled 2022-02-03 (×3): qty 0.25

## 2022-02-03 MED ORDER — LORAZEPAM 2 MG/ML IJ SOLN *I*
1.0000 mg | INTRAMUSCULAR | Status: DC | PRN
Start: 2022-02-03 — End: 2022-02-05

## 2022-02-03 MED ORDER — LORAZEPAM 1 MG PO TABS *I*
1.0000 mg | ORAL_TABLET | ORAL | Status: DC | PRN
Start: 2022-02-03 — End: 2022-02-05
  Administered 2022-02-04 – 2022-02-05 (×2): 1 mg via ORAL
  Filled 2022-02-03 (×2): qty 1

## 2022-02-03 NOTE — Progress Notes (Signed)
Pt on comfort care. Pt rested comfortably through the night. Pt did state was in pain this morning and given morphine per [facility] at 0536. Pt given sips of water periodically through the night. Pt peri care and re-postioning done as noted in flowsheets. Pt able to express needs.

## 2022-02-03 NOTE — Progress Notes (Signed)
Daily Progress Note  Hospital Medicine      Patient Name: Larry Choi  Date of Birth: 1941/02/06  MRN#: [id number]  Admission Date: 02/22/22  Date of Service: 02/03/2022      Interval History  Patient seen and examined, reviewed event and data   Lethargic , opened his eyes when I called his name, denies any discomfort, as per nursing has loose stool in rectum but to weak to have a BM, suppository added.Marland Kitchen      Physical Exam:  Vital Signs:  BP (!) 142/91 (BP Location: Left arm)   Pulse 70   Temp 36.8 C (98.2 F) (Temporal)   Resp 19   Ht 1.753 m (5\' 9" )   Wt 48.1 kg (106 lb 1.6 oz)   SpO2 96%   BMI 15.67 kg/m     Physical Exam  Constitutional:       Appearance: He is ill-appearing.   Cardiovascular:      Rate and Rhythm: Normal rate and regular rhythm.      Pulses: Normal pulses.      Heart sounds: Normal heart sounds.   Pulmonary:      Breath sounds: Rhonchi present.   Abdominal:      Palpations: Abdomen is soft.   Musculoskeletal:      Right lower leg: No edema.      Left lower leg: No edema.   Skin:     General: Skin is dry.      Coloration: Skin is pale.      Findings: Bruising present.   Neurological:      Mental Status: He is disoriented.      Motor: Weakness present.     Current MEDS    Scheduled                                                                                                                                                                                                                                    Infusions        Data (Lab, Microbiology, Imaging Results):    Personally reviewed and are significant for : n/a          Hospital Problem List  Active Hospital Problems    Diagnosis    Hypernatremia      Resolved Hospital Problems   No resolved problems to display.        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 In the ED, the patient was found to by severely hypernatremic with an acute kidney injury.  He has been on a declining trend and due to dysphagia has severe malnutrition, referral to hospice already initiated but he declined services as outpatient and did not want to rely on his wife at home and opted for transition to comfort care measures here in the hospital.     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 Despite PEG tube for enteral access he's continued to lose weight and likely has inadequate enteral nutrition. Previously declined Hospice referral.   - Discussed overall decline and poor prognosis with patient. Despite a diagnosis of dementia, patient is awake, alert, oriented x 3 and has capacity to make his own medical decisions. Able to comprehend risks/benefits.  Opted for comfort care measures on 9/28 and form completed today to reflect wishes  : DNR/DNI, do not send to the hospital, no feeding tube, no IVF, no dialysis, no antibiotics  - Given patient's dysphagia and poor PO intake and now with no feeding tube, patient's prognosis is likely less than 3 months.   - SW consulted and following for placement. Home with Hospice is not a viable option.      Hypernatremia, acute kidney injury POA  - Likely in setting of poor free water intake even preceding loss of G-tube and now with no intake at all given lack of access.   - CCM only as of 9/28      Dementia:   - Aranesp held as no enteral access   - Although patient with diagnosis with dementia, patient is AAOx 3 and has capacity to make his own medical decisions as discussed above.      Has multiple other comorbidities for which medical therapy was stopped  Coronary artery  disease  Hypertension  GERD  BPH  History of Ramsay Hunt syndrome                 (EDD): Medically Ready for Discharge Date: 02/01/22      Plan and orders for day reviewed with nursing? Yes.    Author:   Aaron Mose, MD   02/03/2022  1:40 PM

## 2022-02-03 DEATH — deceased

## 2022-02-04 MED ORDER — MORPHINE SULFATE 20 MG/ML PO SOLN *I*
5.0000 mg | ORAL | Status: DC | PRN
Start: 2022-02-04 — End: 2022-02-05
  Administered 2022-02-04 – 2022-02-05 (×3): 5 mg via ORAL
  Filled 2022-02-04 (×3): qty 0.25

## 2022-02-04 NOTE — Progress Notes (Signed)
Daily Progress Note  Hospital Medicine      Patient Name: Larry Choi  Date of Birth: [birth year]  MRN#: [id number]  Admission Date: Feb 12, 2022  Date of Service: 02/04/2022      Interval History  Patient seen and examined, reviewed event and data   More sleepy and with labored breathing this morning, as per nursing requesting pain medication earlier, increase frequency of morphine to every 3 hours      Physical Exam:  Vital Signs:  BP (!) 142/91 (BP Location: Left arm)   Pulse 70   Temp 36.8 C (98.2 F) (Temporal)   Resp 21   Ht 1.753 m (5\' 9" )   Wt 48.1 kg (106 lb 1.6 oz)   SpO2 96%   BMI 15.67 kg/m     Physical Exam  Constitutional:       Appearance: He is ill-appearing.   Cardiovascular:      Rate and Rhythm: Normal rate and regular rhythm.      Pulses: Normal pulses.      Heart sounds: Normal heart sounds.   Pulmonary:      Breath sounds: Rhonchi present.   Abdominal:      Palpations: Abdomen is soft.   Musculoskeletal:      Right lower leg: No edema.      Left lower leg: No edema.   Skin:     General: Skin is dry.      Coloration: Skin is pale.      Findings: Bruising present.   Neurological:      Mental Status: He is disoriented.      Motor: Weakness present.     Current MEDS    Scheduled                                                                                                                                                                                                                                    Infusions        Data (Lab, Microbiology, Imaging Results):    Personally reviewed and are significant for : N/A          Hospital Problem List  Active Hospital Problems    Diagnosis    Hypernatremia      Resolved Hospital Problems   No resolved problems to display.        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 In the ED, the patient was found to by severely hypernatremic with an acute kidney injury.  He has been on a declining trend and due to dysphagia has severe malnutrition, referral to hospice already initiated but he declined services as outpatient and did not want to rely on his wife at home and opted for transition to comfort care measures here in the hospital.     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 Despite PEG tube for enteral access he's continued to lose weight and likely has inadequate enteral nutrition. Previously declined Hospice referral.   - Discussed overall decline and poor prognosis with patient. Despite a diagnosis of dementia, patient is awake, alert, oriented x 3 and has capacity to make his own medical decisions. Able to comprehend risks/benefits.  Opted for comfort care measures on 9/28 and form completed today to reflect wishes  : DNR/DNI, do not send to the hospital, no feeding tube, no IVF, no dialysis, no antibiotics  - Given patient's dysphagia and poor PO intake and now with no feeding tube, patient's prognosis is likely less than 3 months.   - SW consulted and following for placement.   Home with Hospice is not a viable option.      Hypernatremia, acute kidney injury POA  Decreased p.o. intake, inability to use G-tube  - CCM only as of 9/28      Dementia:   - Aranesp held as no enteral access   - Although patient with diagnosis with dementia, patient is AAOx 3 and has capacity to make his own medical decisions as discussed above.      Has multiple other comorbidities for which medical therapy was stopped  Coronary artery disease  Hypertension  GERD  BPH  History of Ramsay Hunt syndrome            Medication Reconciliation:  Yes  Fluid/Electrolytes/Nutrition or Diet: Liberal diet  Mobility: As tolerated  Indication for Lines/Tubes/Foley catheter/Telemetry: Foley for comfort  DVT prophylaxis:Not appropriate / not consistent with goals of care  Smoking Cessation: NA  Code Status/GOC:  DNR/DNI, comfort care measures only   Family update/concern: N/A  Disposition Barriers: Will need placement to SNF or [facility], home with Hospice is not an option          (EDD): Medically Ready for Discharge Date: 02/01/22      Plan and orders for day reviewed with nursing? Yes.    Author:   Aaron Mose, MD   02/04/2022  11:34 AM

## 2022-02-04 NOTE — Progress Notes (Signed)
Met with daughter today in follow up.  Pt continues to show decline, per daughter has had no liquid intake in many days.  Visited pt with family present.  His wife is here and he had been asking daughter to see her to reconcile issues they had.  Daughter provided Clinical research associate with SNF application and chose several comfort care homes.  Will send referral accordingly.  Facility preferences are [facility], [facility], [facility], [facility] and [facility].  Would also like referral sent to [facility] and [facility]. Informed daughter that these homes are currently full.  Pt did not arouse to SW visit today.  Very thin on appearance.  Not eating.  Pt pain being controlled by SL meds.  Home is not an option.  Continue to follow, offer support, and assist with discharge plans.     Dorita Fray, SW  [phone]

## 2022-02-04 NOTE — Progress Notes (Signed)
Pt on comfort care. Pt rested comfortably through the night. Pt turned and repositioned, peri care, oral care, lotion applied as noted in flowsheets. Pt denied pain through the night as noted in flowsheets. Pt able to express needs through the evening.

## 2022-02-05 NOTE — Discharge Summary (Signed)
Name: BERTRAM GIPE MRN: [id number] DOB: 15-Jan-1941     Admit Date: February 26, 2022       Patient was accepted for discharge to   Expired in Medical Facility [[id number]]                  Hospitalization Summary           Discharge Summary      Patient Name: Larry Choi  Date of Birth: [birth year]  MRN#: [id number]  Admission Date: Feb 26, 2022  Date of Service: 02/24/2022    Primary Discharge Diagnoses: Hypernatremia     Secondary Diagnoses:  Active Hospital Problems    Diagnosis    Hypernatremia      Resolved Hospital Problems   No resolved problems to display.         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 In the ED, the patient was found to by severely hypernatremic with an acute kidney injury.  He has been on a declining trend and due to dysphagia has severe malnutrition, referral to hospice already initiated but he declined services as outpatient and did not want to rely on his wife at home and opted for transition to comfort care measures here in the hospital.      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 Despite PEG tube for enteral access he's continued to lose weight and likely has inadequate enteral nutrition. Previously declined Hospice referral.   Decision was made to proceed with comfort care measures     Hypernatremia, acute kidney injury POA  Decreased p.o. intake, inability to use G-tube  - CCM only as of 9/28      Dementia:   - Aranesp stopped as no enteral access        Has multiple other comorbidities for which medical therapy was stopped  Coronary artery disease  Hypertension  GERD  BPH  History of  Ramsay Hunt syndrome     Time of death was 12.38 PM    Discharge Exam:  RR -0    General: unresponsive  HEENT: dry oral mucosa, cracked lips,pupils fix and non reactive to light, no corneal reflex  Cardiovascular: no heart sounds, auscultated for 3 min  Pulmonary/Chest: no chest expansion, no breath sounds present   Abdominal: cachectic , no Bowel sounds  Extremity: mottled skin of legs and feet  Neurologic: unresponsive to sternal rub    Radiology:  *Chest STANDARD single view    Result Date: Feb 26, 2022  No radiographic evidence of acute cardiopulmonary process. No radiographic evidence of acute osseous or superficial soft tissue thoracic injury. END OF IMPRESSION       [facility] submits this DICOM format image data and final report to the [facility], an independent secure electronic health information exchange, on a reciprocally searchable basis (with patient authorization) for a minimum of 12 months after exam date.    CT head without contrast    Result Date: 02-26-2022  No CT evidence of acute intracranial abnormality; no findings to suggest acute/subacute transcortical infarction, intracranial mass/lesion, or hemorrhage. No CT evidence of acute osseous or superficial soft tissue head injury. Mild paranasal  sinus mucosal disease. Moderate generalized parenchymal volume loss and cerebral white matter attenuation changes, as above, consistent with 04/12/2021 MRI findings. This appearance is nonspecific and is commonly associated with sequela of nonacute small vessel ischemic gliosis  in a patient this age. Additional info END OF IMPRESSION       [facility] submits this DICOM format image data and final report to the [facility], an independent secure electronic health information exchange, on a reciprocally searchable basis (with patient authorization) for a minimum of 12 months after exam date.          Labs:  Recent Labs   Lab 01/31/22  0706 02-18-2022  1843   WBC 60.0* 73.0*   Hemoglobin 10.7* 13.5  see  below   Hematocrit 38 47   Platelets 126* 169     Recent Labs   Lab 01/31/22  0825 01/31/22  0352 02-18-22  2224 Feb 18, 2022  1843   Sodium 161* 161* 149* 160*   Potassium 4.2 4.2 3.5 4.3   Chloride 128* 126* 118* 123*   CO2 22 23 19* 24   UN 81* 85* 74* 91*   Creatinine 1.71* 1.89* 1.56* 2.00*   Calcium 9.0 9.7 7.7* 10.0   Albumin  --   --   --  3.7   Total Protein  --   --   --  6.5   Bilirubin,Total  --   --   --  0.5   Alk Phos  --   --   --  98   ALT  --   --   --  42   AST  --   --   --  30   Glucose 134* 142* 859* 135*       Pending tests on discharge:     Laboratory Tests Pending Results       No pending results found            Disposition: decease    Patient Instructions:   Current Discharge Medication List        CONTINUE these medications which have NOT CHANGED    Details AM Noon PM Bedtime   donepezil (ARICEPT) 10 MG tablet Take 1 tablet (10 mg total) by mouth 2 times daily                   STOP taking these medications       Ibrutinib (IMBRUVICA PO) Comments:   Reason for Stopping:         buPROPion (WELLBUTRIN SR) 150 mg 12 hr tablet Comments:   Reason for Stopping:         aspirin 325 mg tablet Comments:   Reason for Stopping:         atorvastatin (LIPITOR) 80 mg tablet Comments:   Reason for Stopping:         clopidogrel (PLAVIX) 75 mg tablet Comments:   Reason for Stopping:         finasteride (PROSCAR) 5 mg tablet Comments:   Reason for Stopping:         gabapentin (NEURONTIN) 600 mg tablet Comments:   Reason for Stopping:         lisinopril (PRINIVIL,ZESTRIL) 40 mg tablet Comments:   Reason for Stopping:         Metoprolol Succinate 50 MG CS24 Comments:   Reason for Stopping:               Discharge Instruction:  Follow-up with PCP in  3-5 days   Follow up Labs/specialties: none    Activity: none , deceased  Diet: none, deceased  Wound Care: none needed    Death certificate filled on line DAVE  NR [id number], for Funeral home look for full name VED MACKELLAR    Time spent in discharg summary 35   minutes.    SignedAaron Mose, MD  02/14/2022  4:43 PM                         Signed: Aaron Mose, MD  On: 02/06/2022  at: 4:43 PM

## 2022-02-05 NOTE — Progress Notes (Signed)
Writer called in by family to check on patient and no respirations were seen. No heartbeat or respirations verified by 2 RN's. Provider notified. Time of death 1238pm.     Deland Pretty, RN

## 2022-02-05 NOTE — Progress Notes (Signed)
Placement Coordinator involved to assist with SNF placement. Writer reached out to patient/family's primary choices and he is under review. Will continue to follow up and advocate with SNF placement.    Chiquetta Langner, BSW  Placement Coordinator  [phone]

## 2022-02-12 LAB — BASIC METABOLIC PANEL
BUN/Creatinine Ratio: 20 (ref 10–24)
BUN: 36 mg/dL — ABNORMAL HIGH (ref 8–27)
CO2: 18 mmol/L — ABNORMAL LOW (ref 20–29)
Calcium: 9.5 mg/dL (ref 8.6–10.2)
Chloride: 103 mmol/L (ref 96–106)
Creatinine, Ser: 1.8 mg/dL — ABNORMAL HIGH (ref 0.76–1.27)
Glucose: 299 mg/dL — ABNORMAL HIGH (ref 70–99)
Potassium: 4.6 mmol/L (ref 3.5–5.2)
Sodium: 138 mmol/L (ref 134–144)
eGFR: 38 mL/min/{1.73_m2} — ABNORMAL LOW (ref >59)

## 2022-02-20 ENCOUNTER — Encounter: Payer: Self-pay | Admitting: Cardiology

## 2022-02-20 ENCOUNTER — Telehealth: Payer: Self-pay | Admitting: *Deleted

## 2022-02-20 DIAGNOSIS — I1A0 Resistant hypertension: Secondary | ICD-10-CM

## 2022-02-20 DIAGNOSIS — R899 Unspecified abnormal finding in specimens from other organs, systems and tissues: Secondary | ICD-10-CM

## 2022-02-20 DIAGNOSIS — I110 Hypertensive heart disease with heart failure: Secondary | ICD-10-CM

## 2022-02-20 NOTE — Telephone Encounter (Signed)
-----   Message from Leonie Man, MD sent at 02/19/2022 12:14 AM EDT ----- Chemistry panel looks pretty poorly controlled.  Glucose levels are very high at almost 300.  Kidney function is definitely worse with a creatinine of 1.8.   Does not appear to be any more dehydrated, but would hold metformin and lisinopril for 2 days.  Also hold HCTZ for 5 days.  Increased oral fluid intake.  Recheck labs in 1 week.-BMP  Glenetta Hew, MD

## 2022-02-20 NOTE — Telephone Encounter (Signed)
The patient's wife has been notified of the result and verbalized understanding.  All questions (if any) were answered. Wife states she reviewed patient's result. Patient has took it upon his self to change metformin regular strength 1000 mg twice a day  from metformin XR 500 mg twice a day once he saw results.because wife states patient was very fatigue on a daily bases.  RN went over instructions to hold metformin and lisinopril for 2 days and HCTZ for 5 days. Increase oral intake.  Labs in 7 days (10/25 or 10/26),reschedule CVRR appointment to Nov 6,at 3:30 pm. Patient can only come on Mondays or Fridays.  Wife verbalized understanding. Order placed. Raiford Simmonds, RN 02/20/2022 1:58 PM

## 2022-02-25 ENCOUNTER — Ambulatory Visit: Payer: Medicare Other

## 2022-03-01 LAB — BASIC METABOLIC PANEL
BUN/Creatinine Ratio: 26 — ABNORMAL HIGH (ref 10–24)
BUN: 44 mg/dL — ABNORMAL HIGH (ref 8–27)
CO2: 21 mmol/L (ref 20–29)
Calcium: 9.3 mg/dL (ref 8.6–10.2)
Chloride: 102 mmol/L (ref 96–106)
Creatinine, Ser: 1.67 mg/dL — ABNORMAL HIGH (ref 0.76–1.27)
Glucose: 132 mg/dL — ABNORMAL HIGH (ref 70–99)
Potassium: 4.4 mmol/L (ref 3.5–5.2)
Sodium: 140 mmol/L (ref 134–144)
eGFR: 41 mL/min/{1.73_m2} — ABNORMAL LOW (ref >59)

## 2022-03-06 DEATH — deceased

## 2022-03-11 ENCOUNTER — Encounter: Payer: Self-pay | Admitting: Pharmacist Clinician (PhC)/ Clinical Pharmacy Specialist

## 2022-03-11 ENCOUNTER — Ambulatory Visit: Payer: Medicare Other | Attending: Cardiology | Admitting: Pharmacist Clinician (PhC)/ Clinical Pharmacy Specialist

## 2022-03-11 VITALS — BP 150/60 | HR 78

## 2022-03-11 DIAGNOSIS — I1A0 Resistant hypertension: Secondary | ICD-10-CM | POA: Diagnosis not present

## 2022-03-11 DIAGNOSIS — I5032 Chronic diastolic (congestive) heart failure: Secondary | ICD-10-CM | POA: Diagnosis not present

## 2022-03-11 DIAGNOSIS — I110 Hypertensive heart disease with heart failure: Secondary | ICD-10-CM

## 2022-03-11 NOTE — Progress Notes (Signed)
03/11/2022 Mark Cross 01-Jul-1940 [id number]   HPI:  Mark Cross is a 81 y.o. male patient of Dr Ellyn Hack, with a PMH below who presents today for hypertension clinic evaluation.  Patient reports has had hypertension since he was in his 65's and has rarely been below 638 systolic in all these years.  States most home readings 466-599 systolic, diastolic has always been WNL.  He saw Dr. Ellyn Hack last month and there was some confusion about what medications he was on.  Verapamil was switched to amlodipine for better control and he was asked to follow up with PharmD and bring all medications for review.  Since then I have seen him twice; atenolol was switched to carvedilol, then spironolactone 25 mg was added.  Labs drawn a few weeks later showed SCr jump from 1.18 to 1.80.  He was asked to hold lisinopril and metformin for 2 days, and hctz for 5.  Repeat lab was improved, although still elevated.  He was asked to hold hctz for another 5 days.    Today he returns for follow up.  Only took spiro for 2-3 weeks then stopped  149/60 this am; no 130's but mostly 140-150's States drinking enough water - wife fussing about water intake  Past Medical History: hyperlipidemia Followed by pcp on simvastatin  DM2 5/23 A1c 6.5 on metformin 1 gm bid     Blood Pressure Goal:  130/80  Current Medications:   AM: lisinopril 40, hctz 25,  hydralazine 100, carvedilol 25 Noon: hydralazine 100   PM: amlodipine 10, hydralazine 100, carvedilol 25,   Social Hx: quit smoking about 30 years ago; occasional beer; has cut back on that over time; coffee each morning 1 cup   Diet: has cut back on Gatorade - was drinking 20 oz/day; vegetables - fresh at this time of year, canned/frozen in the winter; not much meat, likes seafood, for protein beans few times each week  Exercise: walks most days has some "swimmyheadedness" so uses cane  Home BP readings:  home readings from mix of wrist and arm monitors  AM 19  readings average 152/57  HR 65  PM 15 readings average 150/54  HR 73  Intolerances: nkda  Labs:  5/23: Na 142, K 4.,Gllu 101, BUN 38, SCr 1.18, GFR > 60   Wt Readings from Last 3 Encounters:  11/09/21 128 lb (58.1 kg)  09/26/21 129 lb (58.5 kg)  08/31/21 130 lb 12.8 oz (59.3 kg)   BP Readings from Last 3 Encounters:  03/11/22 (!) 150/60  12/21/21 (!) 172/62  11/09/21 (!) 179/82   Pulse Readings from Last 3 Encounters:  03/11/22 78  12/21/21 (!) 57  11/09/21 (!) 59    Current Outpatient Medications  Medication Sig Dispense Refill   acetaminophen (TYLENOL) 500 MG tablet Take 2 tablets (1,000 mg total) by mouth every 6 (six) hours as needed for mild pain or moderate pain. 60 tablet 0   amLODipine (NORVASC) 10 MG tablet Take 1 tablet (10 mg total) by mouth daily. 90 tablet 3   carvedilol (COREG) 25 MG tablet Take 1 tablet (25 mg total) by mouth 2 (two) times daily. 60 tablet 3   ferrous sulfate 325 (65 FE) MG tablet Take 325 mg by mouth in the morning.     furosemide (LASIX) 20 MG tablet Take 1 tablet (20 mg total) by mouth daily. 90 tablet 3   hydrALAZINE (APRESOLINE) 100 MG tablet Take 100 mg by mouth 3 (three) times  daily.     hydrochlorothiazide (HYDRODIURIL) 50 MG tablet Take 25 mg by mouth daily.     lisinopril (ZESTRIL) 40 MG tablet Take 40 mg by mouth in the morning.     metFORMIN (GLUCOPHAGE-XR) 500 MG 24 hr tablet Take 500 mg by mouth 2 (two) times daily with a meal.     simvastatin (ZOCOR) 40 MG tablet Take 40 mg by mouth in the morning.     vitamin B-12 (CYANOCOBALAMIN) 1000 MCG tablet Take 1,000 mcg by mouth in the morning.     Current Facility-Administered Medications  Medication Dose Route Frequency Provider Last Rate Last Admin   sodium chloride flush (NS) 0.9 % injection 3 mL  3 mL Intravenous Q12H Monge, Emily C, NP        No Known Allergies  Past Medical History:  Diagnosis Date   DJD (degenerative joint disease), lumbosacral    Hyperlipidemia    On  statin   Hypertension    Resistant; on 4 medications.;  Doppler evidence of RAS-but does not meet criteria for RA revascularization by CORAL Trial   Osteoarthritis of right hip    Bone-on-bone pain more severe pain (Dr. Margarita Rana)   PAD (peripheral artery disease) (HCC) 03/14/2021   ABIs to evaluate right foot pain: R ABI 0.67, TBI 0.34.  L ABI 1.12, TBI 0.52.  Indicates moderate RLE arterial disease.  No significant L LE disease.   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 Nolon Bussing from vascular surgery for bilateral RAS and SMA stenosis given poorly controlled hypertension.  Recommended to continue medical management as he did not meet criteria based on CORAL Trial.   Type 2 diabetes mellitus without complication (HCC)    On metformin only.    Blood pressure (!) 150/60, pulse 78.  Repeat BP 156/58  Resistant hypertension Patient with resistant hypertension, now compounded with increase in SCr.  Will repeat BMET today to determine if has dropped back down to baseline.  If not, would suggest stopping HCTZ as it has the least impact on BP readings.  He is not willing to keep adding medications to get to a goal of < 130, but rather is content to keep systolic readings in the 140-150's.  Diastolic readings all WNL.  He is scheduled to see Dr. Herbie Baltimore in January, so we can continue to follow as needed after that time.    Mark Hay PharmD CPP [facility] [address] [place], [place] [address] [phone]

## 2022-03-11 NOTE — Assessment & Plan Note (Signed)
Patient with resistant hypertension, now compounded with increase in SCr.  Will repeat BMET today to determine if has dropped back down to baseline.  If not, would suggest stopping HCTZ as it has the least impact on BP readings.  He is not willing to keep adding medications to get to a goal of < 130, but rather is content to keep systolic readings in the 233-435'W.  Diastolic readings all WNL.  He is scheduled to see Dr. Ellyn Hack in January, so we can continue to follow as needed after that time.

## 2022-03-11 NOTE — Patient Instructions (Signed)
Return for a a follow up appointment with Dr. Ellyn Hack in January  Go to the lab today to check kidney function  Check your blood pressure at home daily 3-4 times each week and keep record of the readings.  Take your BP meds as follows:  No changes to medications today.   Once we get the lab results back we may make some changes - we will call you in the next day or two if so.   Bring all of your meds, your BP cuff and your record of home blood pressures to your next appointment.  Exercise as you're able, try to walk approximately 30 minutes per day.  Keep salt intake to a minimum, especially watch canned and prepared boxed foods.  Eat more fresh fruits and vegetables and fewer canned items.  Avoid eating in fast food restaurants.    HOW TO TAKE YOUR BLOOD PRESSURE: Rest 5 minutes before taking your blood pressure.  Don't smoke or drink caffeinated beverages for at least 30 minutes before. Take your blood pressure before (not after) you eat. Sit comfortably with your back supported and both feet on the floor (don't cross your legs). Elevate your arm to heart level on a table or a desk. Use the proper sized cuff. It should fit smoothly and snugly around your bare upper arm. There should be enough room to slip a fingertip under the cuff. The bottom edge of the cuff should be 1 inch above the crease of the elbow. Ideally, take 3 measurements at one sitting and record the average.

## 2022-03-13 LAB — BASIC METABOLIC PANEL
BUN/Creatinine Ratio: 21 (ref 10–24)
BUN: 29 mg/dL — ABNORMAL HIGH (ref 8–27)
CO2: 16 mmol/L — ABNORMAL LOW (ref 20–29)
Calcium: 9.5 mg/dL (ref 8.6–10.2)
Chloride: 103 mmol/L (ref 96–106)
Creatinine, Ser: 1.35 mg/dL — ABNORMAL HIGH (ref 0.76–1.27)
Glucose: 197 mg/dL — ABNORMAL HIGH (ref 70–99)
Potassium: 4 mmol/L (ref 3.5–5.2)
Sodium: 141 mmol/L (ref 134–144)
eGFR: 53 mL/min/{1.73_m2} — ABNORMAL LOW (ref >59)

## 2022-04-19 ENCOUNTER — Telehealth: Payer: Self-pay | Admitting: *Deleted

## 2022-04-19 NOTE — Telephone Encounter (Signed)
-----   Message from Marykay Lex, MD sent at 04/18/2022  9:22 PM EST ----- Chemistry panel looks better.  Blood sugars are still high, but the kidney function looks improved.  Not quite back to baseline, but would recommend t holding HCTZ for another week and then okay to restart meds.  Bryan Lemma, MD

## 2022-04-19 NOTE — Telephone Encounter (Signed)
Left message on patient answer machine of results.-  to call back if he has any questions

## 2022-05-07 ENCOUNTER — Ambulatory Visit (HOSPITAL_COMMUNITY): Admission: RE | Admit: 2022-05-07 | Payer: Medicare Other | Source: Ambulatory Visit

## 2022-05-28 ENCOUNTER — Ambulatory Visit: Payer: Medicare Other | Admitting: Cardiology

## 2022-05-31 LAB — CBC AND DIFFERENTIAL
HCT: 39 — AB (ref 41–53)
Hemoglobin: 13.1 — AB (ref 13.5–17.5)
Platelets: 371 10*3/uL (ref 150–400)
WBC: 10

## 2022-06-01 LAB — BASIC METABOLIC PANEL
BUN: 33 — AB (ref 4–21)
CO2: 19 (ref 13–22)
Chloride: 101 (ref 99–108)
Creatinine: 1.4 — AB (ref 0.6–1.3)
Glucose: 137
Potassium: 4.6 mEq/L (ref 3.5–5.1)
Sodium: 142 (ref 137–147)

## 2022-06-01 LAB — LIPID PANEL
Cholesterol: 210 — AB (ref 0–200)
HDL: 44 (ref 35–70)
LDL Cholesterol: 33
Triglycerides: 187 — AB (ref 40–160)

## 2022-06-01 LAB — HEPATIC FUNCTION PANEL
ALT: 11 U/L (ref 10–40)
AST: 16 (ref 14–40)
Alkaline Phosphatase: 75 (ref 25–125)
Bilirubin, Total: 0.3

## 2022-06-01 LAB — COMPREHENSIVE METABOLIC PANEL
Albumin: 5 (ref 3.5–5.0)
Calcium: 10.8 — AB (ref 8.7–10.7)
Globulin: 2.1
eGFR: 51

## 2022-06-01 LAB — HEMOGLOBIN A1C: Hemoglobin A1C: 6.4

## 2022-06-01 LAB — VITAMIN D 25 HYDROXY (VIT D DEFICIENCY, FRACTURES): Vit D, 25-Hydroxy: 17.7

## 2022-06-01 LAB — VITAMIN B12: Vitamin B-12: 1097

## 2022-06-01 LAB — CBC: RBC: 4.44 (ref 3.87–5.11)

## 2022-07-04 ENCOUNTER — Encounter: Payer: Self-pay | Admitting: Radiology

## 2022-10-28 LAB — BASIC METABOLIC PANEL
BUN: 37 — AB (ref 4–21)
CO2: 20 (ref 13–22)
Chloride: 103 (ref 99–108)
Creatinine: 2 — AB (ref 0.6–1.3)
Glucose: 116
Potassium: 5.3 mEq/L — AB (ref 3.5–5.1)
Sodium: 138 (ref 137–147)

## 2022-10-28 LAB — COMPREHENSIVE METABOLIC PANEL
Calcium: 9.8 (ref 8.7–10.7)
eGFR: 33

## 2022-11-01 LAB — HEMOGLOBIN A1C: Hemoglobin A1C: 5.4

## 2022-11-04 ENCOUNTER — Ambulatory Visit (INDEPENDENT_AMBULATORY_CARE_PROVIDER_SITE_OTHER): Payer: Medicare Other | Admitting: Internal Medicine

## 2022-11-04 ENCOUNTER — Encounter: Payer: Self-pay | Admitting: Internal Medicine

## 2022-11-04 VITALS — BP 177/48 | HR 94 | Ht 67.5 in | Wt 125.0 lb

## 2022-11-04 DIAGNOSIS — I1A0 Resistant hypertension: Secondary | ICD-10-CM | POA: Diagnosis not present

## 2022-11-04 DIAGNOSIS — E119 Type 2 diabetes mellitus without complications: Secondary | ICD-10-CM

## 2022-11-04 DIAGNOSIS — E1169 Type 2 diabetes mellitus with other specified complication: Secondary | ICD-10-CM | POA: Diagnosis not present

## 2022-11-04 DIAGNOSIS — D509 Iron deficiency anemia, unspecified: Secondary | ICD-10-CM

## 2022-11-04 DIAGNOSIS — F1190 Opioid use, unspecified, uncomplicated: Secondary | ICD-10-CM

## 2022-11-04 DIAGNOSIS — E538 Deficiency of other specified B group vitamins: Secondary | ICD-10-CM

## 2022-11-04 DIAGNOSIS — M545 Low back pain, unspecified: Secondary | ICD-10-CM

## 2022-11-04 DIAGNOSIS — E785 Hyperlipidemia, unspecified: Secondary | ICD-10-CM

## 2022-11-04 DIAGNOSIS — G8929 Other chronic pain: Secondary | ICD-10-CM

## 2022-11-04 NOTE — Assessment & Plan Note (Signed)
He is currently prescribed simvastatin 40 mg daily.  Labs were updated last week at Dr. Michelle Nasuti office.  Records have been requested today.

## 2022-11-04 NOTE — Assessment & Plan Note (Signed)
He endorses a history of iron deficiency anemia and is currently taking oral iron supplementation daily. -Recent labs have been requested today

## 2022-11-04 NOTE — Assessment & Plan Note (Signed)
Last A1c unknown, but labs were updated on Friday (6/28).  He is currently prescribed metformin XR 500 mg twice daily and glimepiride 1 mg daily.  He reports today that the dose of metformin was recently reduced due to diarrhea.  Diarrhea is improving. -No medication changes today.  Review recent labs at follow-up in 2 weeks.

## 2022-11-04 NOTE — Patient Instructions (Signed)
It was a pleasure to see you today.  Thank you for giving [place] the opportunity to be involved in your care.  Below is a brief recap of your visit and next steps.  We will plan to see you again in 2 weeks.  Summary You have established care today We will request records and labs from Dr. Michelle Nasuti office Follow up in 2 weeks for hypertension management. In the interim please check and record your blood pressure daily at home.

## 2022-11-04 NOTE — Assessment & Plan Note (Signed)
Takes oral vitamin B12 supplementation daily

## 2022-11-04 NOTE — Assessment & Plan Note (Signed)
History of resistant hypertension.  He has previously been seen by cardiology and a clinical pharmacist, last in November 2023.  He reports today that he is currently taking amlodipine 10 mg daily, HCTZ 25 mg daily, lisinopril 40 mg daily, hydralazine 100 mg 3 times daily, and Lasix 20 mg daily.  It appears that he is also prescribed carvedilol 25 mg twice daily but he reports that he has not been taking this medication recently.  His blood pressure is significantly elevated today, 193/53 initially and 177/48 on repeat.  He checks his blood pressure regularly at home and reports that home readings are significantly improved from what we are seeing today. -No medication changes have been made today.  I have requested records from Dr. Michelle Nasuti office.  We will tentatively plan for follow-up in 2 weeks for HTN management.  In the interim, I have requested that Mr. Hearns check his blood pressure at least once daily and record readings to bring with him to his next appointment.

## 2022-11-04 NOTE — Progress Notes (Signed)
New Patient Office Visit  Subjective    Patient ID: Mark Cross, male    DOB: 1941-01-10  Age: 82 y.o. MRN: [id number]  CC:  Chief Complaint  Patient presents with   Establish Care    HPI Mark Cross presents to establish care.  He is an 82 year old male with a past medical history significant for T2DM, HTN, iron deficiency anemia, HLD, vitamin B12 deficiency, and chronic back pain.  Previously followed by Dr. Sudie Cross.  Mark Cross reports feeling well today.  He endorses a 34-month history of diarrhea that has started to improve with recent medication adjustments.  He is otherwise asymptomatic and has no acute concerns to discuss today.  He is retired from working in a [employer].  He endorses a remote history of tobacco use, quitting 40 years ago, and denies alcohol and illicit drug use.  Family medical history is significant for AAA, DM, and HTN.  Chronic medical conditions and outstanding preventative care items discussed today are individually addressed in A/P below.  Outpatient Encounter Medications as of 11/04/2022  Medication Sig   acetaminophen (TYLENOL) 500 MG tablet Take 2 tablets (1,000 mg total) by mouth every 6 (six) hours as needed for mild pain or moderate pain.   amLODipine (NORVASC) 10 MG tablet Take 1 tablet (10 mg total) by mouth daily.   escitalopram (LEXAPRO) 10 MG tablet Take 10 mg by mouth daily.   ferrous sulfate 325 (65 FE) MG tablet Take 325 mg by mouth in the morning.   furosemide (LASIX) 20 MG tablet Take 1 tablet (20 mg total) by mouth daily.   glimepiride (AMARYL) 1 MG tablet Take 1 mg by mouth daily.   hydrALAZINE (APRESOLINE) 100 MG tablet Take 100 mg by mouth 3 (three) times daily.   hydrochlorothiazide (HYDRODIURIL) 50 MG tablet Take 25 mg by mouth daily.   lisinopril (ZESTRIL) 40 MG tablet Take 40 mg by mouth in the morning.   metFORMIN (GLUCOPHAGE-XR) 500 MG 24 hr tablet Take 500 mg by mouth 2 (two) times daily with a meal.    oxyCODONE-acetaminophen (PERCOCET) 10-325 MG tablet Take 1 tablet by mouth 4 (four) times daily as needed for pain.   simvastatin (ZOCOR) 40 MG tablet Take 40 mg by mouth in the morning.   vitamin B-12 (CYANOCOBALAMIN) 1000 MCG tablet Take 1,000 mcg by mouth in the morning.   [DISCONTINUED] carvedilol (COREG) 25 MG tablet Take 1 tablet (25 mg total) by mouth 2 (two) times daily.   Facility-Administered Encounter Medications as of 11/04/2022  Medication   sodium chloride flush (NS) 0.9 % injection 3 mL    Past Medical History:  Diagnosis Date   DJD (degenerative joint disease), lumbosacral    Hyperlipidemia    On statin   Hypertension    Resistant; on 4 medications.;  Doppler evidence of RAS-but does not meet criteria for RA revascularization by CORAL Trial   Osteoarthritis of right hip    Bone-on-bone pain more severe pain (Dr. Margarita Rana)   PAD (peripheral artery disease) (HCC) 03/14/2021   ABIs to evaluate right foot pain: R ABI 0.67, TBI 0.34.  L ABI 1.12, TBI 0.52.  Indicates moderate RLE arterial disease.  No significant L LE disease.   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 Nolon Bussing from vascular surgery for bilateral RAS and SMA stenosis given poorly controlled hypertension.  Recommended to continue medical management as he did not meet criteria based on CORAL Trial.  Type 2 diabetes mellitus without complication (HCC)    On metformin only.    Past Surgical History:  Procedure Laterality Date   INGUINAL HERNIA REPAIR Bilateral 08/10/2012   Procedure: HERNIA REPAIR INGUINAL ADULT;  Surgeon: Marlane Hatcher, MD;  Location: [facility];  Service: General;  Laterality: Bilateral;   RIGHT HEART CATH N/A 08/01/2021   Procedure: RIGHT HEART CATH;  Surgeon: Marykay Lex, MD;  Location: [facility] INVASIVE CV LAB:: Severe systemic HTN-SBP range 210-220 mmHg. Mod-Severe Pulm HTN: PAP-mean 51/15 mmHg -32 mmHg. (Avg PAP 60/18 mmHg-mean 38 mmHg), (When SBP increased  200-> 224, PAP increased to 66 mmHg), PCWP 24-28 mmHg; Mean RAP 4 mmHg, RVP-EDP 60/0-9 mmHg.  Cardiac Output Index: 7-4.05 by TD, Fick 5.81-3.37.   TOTAL HIP ARTHROPLASTY Right 10/02/2021   Procedure: TOTAL HIP ARTHROPLASTY ANTERIOR APPROACH;  Surgeon: Sheral Apley, MD;  Location: [facility];  Service: Orthopedics;  Laterality: Right;    No family history on file.  Social History   Socioeconomic History   Marital status: Married    Spouse name: Not on file   Number of children: Not on file   Years of education: Not on file   Highest education level: Not on file  Occupational History   Not on file  Tobacco Use   Smoking status: Former   Smokeless tobacco: Never  Vaping Use   Vaping Use: Never used  Substance and Sexual Activity   Alcohol use: Not Currently    Comment: seldom   Drug use: No   Sexual activity: Not on file  Other Topics Concern   Not on file  Social History Narrative   Not on file   Social Determinants of Health   Financial Resource Strain: Not on file  Food Insecurity: Not on file  Transportation Needs: Not on file  Physical Activity: Not on file  Stress: Not on file  Social Connections: Not on file  Intimate Partner Violence: Not on file   Review of Systems  Constitutional:  Negative for chills and fever.  HENT:  Negative for sore throat.   Respiratory:  Negative for cough and shortness of breath.   Cardiovascular:  Negative for chest pain, palpitations and leg swelling.  Gastrointestinal:  Positive for diarrhea. Negative for abdominal pain, blood in stool, constipation, nausea and vomiting.  Genitourinary:  Negative for dysuria and hematuria.  Musculoskeletal:  Positive for back pain (Chronic lumbar back pain). Negative for myalgias.  Skin:  Negative for itching and rash.  Neurological:  Negative for dizziness and headaches.  Psychiatric/Behavioral:  Negative for depression and suicidal ideas.    Objective    BP (!) 177/48   Pulse 94   Ht 5'  7.5" (1.715 m)   Wt 125 lb (56.7 kg)   SpO2 96%   BMI 19.29 kg/m   Physical Exam Vitals reviewed.  Constitutional:      General: He is not in acute distress.    Appearance: Normal appearance. He is not ill-appearing.  HENT:     Head: Normocephalic and atraumatic.     Right Ear: External ear normal.     Left Ear: External ear normal.     Nose: Nose normal. No congestion or rhinorrhea.     Mouth/Throat:     Mouth: Mucous membranes are moist.     Pharynx: Oropharynx is clear.  Eyes:     General: No scleral icterus.    Extraocular Movements: Extraocular movements intact.     Conjunctiva/sclera: Conjunctivae normal.  Pupils: Pupils are equal, round, and reactive to light.  Cardiovascular:     Rate and Rhythm: Normal rate and regular rhythm.     Pulses: Normal pulses.     Heart sounds: Murmur heard.  Pulmonary:     Effort: Pulmonary effort is normal.     Breath sounds: Normal breath sounds. No wheezing, rhonchi or rales.  Abdominal:     General: Abdomen is flat. Bowel sounds are normal. There is no distension.     Palpations: Abdomen is soft.     Tenderness: There is no abdominal tenderness.  Musculoskeletal:        General: No swelling or deformity. Normal range of motion.     Cervical back: Normal range of motion.  Skin:    General: Skin is warm and dry.     Capillary Refill: Capillary refill takes less than 2 seconds.  Neurological:     General: No focal deficit present.     Mental Status: He is alert and oriented to person, place, and time.     Motor: No weakness.  Psychiatric:        Mood and Affect: Mood normal.        Behavior: Behavior normal.        Thought Content: Thought content normal.    Assessment & Plan:   Problem List Items Addressed This Visit       Resistant hypertension - Primary (Chronic)    History of resistant hypertension.  He has previously been seen by cardiology and a clinical pharmacist, last in November 2023.  He reports today that he  is currently taking amlodipine 10 mg daily, HCTZ 25 mg daily, lisinopril 40 mg daily, hydralazine 100 mg 3 times daily, and Lasix 20 mg daily.  It appears that he is also prescribed carvedilol 25 mg twice daily but he reports that he has not been taking this medication recently.  His blood pressure is significantly elevated today, 193/53 initially and 177/48 on repeat.  He checks his blood pressure regularly at home and reports that home readings are significantly improved from what we are seeing today. -No medication changes have been made today.  I have requested records from Dr. Michelle Nasuti office.  We will tentatively plan for follow-up in 2 weeks for HTN management.  In the interim, I have requested that Mark Cross check his blood pressure at least once daily and record readings to bring with him to his next appointment.      Hyperlipidemia associated with type 2 diabetes mellitus (HCC) (Chronic)    He is currently prescribed simvastatin 40 mg daily.  Labs were updated last week at Dr. Michelle Nasuti office.  Records have been requested today.      Type 2 diabetes mellitus without complications (HCC)    Last A1c unknown, but labs were updated on Friday (6/28).  He is currently prescribed metformin XR 500 mg twice daily and glimepiride 1 mg daily.  He reports today that the dose of metformin was recently reduced due to diarrhea.  Diarrhea is improving. -No medication changes today.  Review recent labs at follow-up in 2 weeks.      Iron deficiency anemia    He endorses a history of iron deficiency anemia and is currently taking oral iron supplementation daily. -Recent labs have been requested today      Vitamin B12 deficiency    Takes oral vitamin B12 supplementation daily      Chronic lumbar pain    He endorses a  history of chronic lumbar back pain in the setting of multilevel degenerative changes as well as neuroforaminal stenosis.  He is currently managing pain with Percocet 4 times daily.  He  reports today that he is not a surgical candidate.  He has tried steroid injections previously. -I reviewed with Mark Cross my concern for chronic opioid use as his primary modality for managing pain.  Through shared decision making, he has been referred to pain management to discuss additional treatment options.       Return in about 2 weeks (around 11/18/2022) for HTN, lab review.   Billie Lade, MD

## 2022-11-04 NOTE — Assessment & Plan Note (Signed)
He endorses a history of chronic lumbar back pain in the setting of multilevel degenerative changes as well as neuroforaminal stenosis.  He is currently managing pain with Percocet 4 times daily.  He reports today that he is not a surgical candidate.  He has tried steroid injections previously. -I reviewed with Mark Cross my concern for chronic opioid use as his primary modality for managing pain.  Through shared decision making, he has been referred to pain management to discuss additional treatment options.

## 2022-11-22 ENCOUNTER — Ambulatory Visit: Payer: Medicare Other | Admitting: Internal Medicine

## 2022-12-16 ENCOUNTER — Encounter: Payer: Self-pay | Admitting: Internal Medicine

## 2022-12-16 ENCOUNTER — Ambulatory Visit (INDEPENDENT_AMBULATORY_CARE_PROVIDER_SITE_OTHER): Payer: Medicare Other | Admitting: Internal Medicine

## 2022-12-16 VITALS — BP 173/54 | HR 105 | Ht 67.5 in | Wt 128.2 lb

## 2022-12-16 DIAGNOSIS — I1A0 Resistant hypertension: Secondary | ICD-10-CM | POA: Diagnosis not present

## 2022-12-16 MED ORDER — CARVEDILOL 12.5 MG PO TABS: 12.5000 mg | ORAL_TABLET | Freq: Two times a day (BID) | ORAL | 3 refills | Status: DC

## 2022-12-16 NOTE — Assessment & Plan Note (Signed)
Presenting today for HTN follow-up.  His blood pressure remains significantly elevated, 184/55 initially and 173/54 on repeat.  His current antihypertensive regimen consists of amlodipine 10 mg daily, hydralazine 100 mg 3 times daily, lisinopril 40 mg daily, HCTZ 25 mg daily, and Lasix 20 mg daily.  He endorses compliance with this regimen.  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  He was then evaluated by vascular surgery, who felt that continued medical management was indicated as he did not meet criteria for revascularization based on the CORAL trial.  It appears that he was previously prescribed carvedilol for treatment of hypertension as well, but he is unaware of this. -Add carvedilol 12.5 mg twice daily.  Continue additional medications as previously prescribed -He will follow-up in 4 weeks for HTN check.  We previously attempted to obtain lab results from his previous provider but were unsuccessful.  Mark Cross states that he has a results on his phone.  I have requested that he bring these results to his next appointment for review.

## 2022-12-16 NOTE — Progress Notes (Signed)
Established Patient Office Visit  Subjective   Patient ID: Mark Cross, male    DOB: December 10, 1940  Age: 82 y.o. MRN: [id number]  Chief Complaint  Patient presents with   Hypertension    Follow up   Diabetes    Follow up   Results    Lab results   Mark Cross returns to care today for HTN follow-up.  He was last evaluated by me on 7/1 as a new patient presenting to establish care.  His blood pressure was significantly elevated at that time.  No medication changes were made, previous records were requested, and 2-week follow-up was arranged.  There have been no acute interval events. Mark Cross reports feeling well today.  He is asymptomatic currently and has no additional concerns discussed.  His blood pressure remains significantly elevated.  He has been taking his antihypertensive medications as prescribed.  Past Medical History:  Diagnosis Date   DJD (degenerative joint disease), lumbosacral    Hyperlipidemia    On statin   Hypertension    Resistant; on 4 medications.;  Doppler evidence of RAS-but does not meet criteria for RA revascularization by CORAL Trial   Osteoarthritis of right hip    Bone-on-bone pain more severe pain (Dr. Margarita Rana)   PAD (peripheral artery disease) (HCC) 03/14/2021   ABIs to evaluate right foot pain: R ABI 0.67, TBI 0.34.  L ABI 1.12, TBI 0.52.  Indicates moderate RLE arterial disease.  No significant L LE disease.   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 Nolon Bussing from vascular surgery for bilateral RAS and SMA stenosis given poorly controlled hypertension.  Recommended to continue medical management as he did not meet criteria based on CORAL Trial.   Type 2 diabetes mellitus without complication (HCC)    On metformin only.   Past Surgical History:  Procedure Laterality Date   INGUINAL HERNIA REPAIR Bilateral 08/10/2012   Procedure: HERNIA REPAIR INGUINAL ADULT;  Surgeon: Marlane Hatcher, MD;  Location: [facility];  Service:  General;  Laterality: Bilateral;   RIGHT HEART CATH N/A 08/01/2021   Procedure: RIGHT HEART CATH;  Surgeon: Marykay Lex, MD;  Location[facility] INVASIVE CV LAB:: Severe systemic HTN-SBP range 210-220 mmHg. Mod-Severe Pulm HTN: PAP-mean 51/15 mmHg -32 mmHg. (Avg PAP 60/18 mmHg-mean 38 mmHg), (When SBP increased 200-> 224, PAP increased to 66 mmHg), PCWP 24-28 mmHg; Mean RAP 4 mmHg, RVP-EDP 60/0-9 mmHg.  Cardiac Output Index: 7-4.05 by TD, Fick 5.81-3.37.   TOTAL HIP ARTHROPLASTY Right 10/02/2021   Procedure: TOTAL HIP ARTHROPLASTY ANTERIOR APPROACH;  Surgeon: Sheral Apley, MD;  Location: [facility];  Service: Orthopedics;  Laterality: Right;   Social History   Tobacco Use   Smoking status: Former   Smokeless tobacco: Never  Vaping Use   Vaping status: Never Used  Substance Use Topics   Alcohol use: Not Currently    Comment: seldom   Drug use: No   History reviewed. No pertinent family history. No Known Allergies  Review of Systems  Constitutional:  Negative for chills and fever.  HENT:  Negative for sore throat.   Respiratory:  Negative for cough and shortness of breath.   Cardiovascular:  Negative for chest pain, palpitations and leg swelling.  Gastrointestinal:  Negative for abdominal pain, blood in stool, constipation, diarrhea, nausea and vomiting.  Genitourinary:  Negative for dysuria and hematuria.  Musculoskeletal:  Negative for myalgias.  Skin:  Negative for itching and rash.  Neurological:  Negative for dizziness and headaches.  Psychiatric/Behavioral:  Negative for depression and suicidal ideas.       Objective:     BP (!) 173/54   Pulse (!) 105   Ht 5' 7.5" (1.715 m)   Wt 128 lb 3.2 oz (58.2 kg)   SpO2 94%   BMI 19.78 kg/m  BP Readings from Last 3 Encounters:  12/16/22 (!) 173/54  11/04/22 (!) 177/48  03/11/22 (!) 150/60   Physical Exam Vitals reviewed.  Constitutional:      General: He is not in acute distress.    Appearance: Normal appearance. He is  not ill-appearing.  HENT:     Head: Normocephalic and atraumatic.     Right Ear: External ear normal.     Left Ear: External ear normal.     Nose: Nose normal. No congestion or rhinorrhea.     Mouth/Throat:     Mouth: Mucous membranes are moist.     Pharynx: Oropharynx is clear.  Eyes:     General: No scleral icterus.    Extraocular Movements: Extraocular movements intact.     Conjunctiva/sclera: Conjunctivae normal.     Pupils: Pupils are equal, round, and reactive to light.  Cardiovascular:     Rate and Rhythm: Normal rate and regular rhythm.     Pulses: Normal pulses.     Heart sounds: Murmur heard.  Pulmonary:     Effort: Pulmonary effort is normal.     Breath sounds: Normal breath sounds. No wheezing, rhonchi or rales.  Abdominal:     General: Abdomen is flat. Bowel sounds are normal. There is no distension.     Palpations: Abdomen is soft.     Tenderness: There is no abdominal tenderness.  Musculoskeletal:        General: No swelling or deformity. Normal range of motion.     Cervical back: Normal range of motion.  Skin:    General: Skin is warm and dry.     Capillary Refill: Capillary refill takes less than 2 seconds.  Neurological:     General: No focal deficit present.     Mental Status: He is alert and oriented to person, place, and time.     Motor: No weakness.  Psychiatric:        Mood and Affect: Mood normal.        Behavior: Behavior normal.        Thought Content: Thought content normal.     Last CBC Lab Results  Component Value Date   WBC 10.5 09/26/2021   HGB 12.4 (L) 09/26/2021   HCT 39.5 09/26/2021   MCV 94.5 09/26/2021   MCH 29.7 09/26/2021   RDW 13.8 09/26/2021   PLT 262 09/26/2021   Last metabolic panel Lab Results  Component Value Date   GLUCOSE 197 (H) 03/11/2022   NA 141 03/11/2022   K 4.0 03/11/2022   CL 103 03/11/2022   CO2 16 (L) 03/11/2022   BUN 29 (H) 03/11/2022   CREATININE 1.35 (H) 03/11/2022   EGFR 53 (L) 03/11/2022    CALCIUM 9.5 03/11/2022   ANIONGAP 11 09/26/2021   Last hemoglobin A1c Lab Results  Component Value Date   HGBA1C 6.5 (H) 09/26/2021     Assessment & Plan:   Problem List Items Addressed This Visit       Resistant hypertension - Primary (Chronic)    Presenting today for HTN follow-up.  His blood pressure remains significantly elevated, 184/55 initially and 173/54 on repeat.  His  current antihypertensive regimen consists of amlodipine 10 mg daily, hydralazine 100 mg 3 times daily, lisinopril 40 mg daily, HCTZ 25 mg daily, and Lasix 20 mg daily.  He endorses compliance with this regimen.  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  He was then evaluated by vascular surgery, who felt that continued medical management was indicated as he did not meet criteria for revascularization based on the CORAL trial.  It appears that he was previously prescribed carvedilol for treatment of hypertension as well, but he is unaware of this. -Add carvedilol 12.5 mg twice daily.  Continue additional medications as previously prescribed -He will follow-up in 4 weeks for HTN check.  We previously attempted to obtain lab results from his previous provider but were unsuccessful.  Mark Cross states that he has a results on his phone.  I have requested that he bring these results to his next appointment for review.      Return in about 4 weeks (around 01/13/2023) for HTN.   Billie Lade, MD

## 2022-12-16 NOTE — Patient Instructions (Signed)
It was a pleasure to see you today.  Thank you for giving [place] the opportunity to be involved in your care.  Below is a brief recap of your visit and next steps.  We will plan to see you again in 4 weeks.  Summary Add carvedilol 12.5 mg twice daily Continue additional medications as prescribed Follow up in 4 weeks for BP check     Schedule your Medicare Annual Wellness Visit at checkout.

## 2022-12-21 ENCOUNTER — Encounter: Payer: Self-pay | Admitting: Internal Medicine

## 2023-02-10 ENCOUNTER — Ambulatory Visit (INDEPENDENT_AMBULATORY_CARE_PROVIDER_SITE_OTHER): Payer: Medicare Other | Admitting: Internal Medicine

## 2023-02-10 ENCOUNTER — Encounter: Payer: Self-pay | Admitting: Internal Medicine

## 2023-02-10 VITALS — BP 125/40 | HR 78 | Ht 67.5 in | Wt 133.2 lb

## 2023-02-10 DIAGNOSIS — Z7984 Long term (current) use of oral hypoglycemic drugs: Secondary | ICD-10-CM

## 2023-02-10 DIAGNOSIS — E119 Type 2 diabetes mellitus without complications: Secondary | ICD-10-CM

## 2023-02-10 DIAGNOSIS — I1A0 Resistant hypertension: Secondary | ICD-10-CM

## 2023-02-10 DIAGNOSIS — N1832 Chronic kidney disease, stage 3b: Secondary | ICD-10-CM | POA: Insufficient documentation

## 2023-02-10 NOTE — Progress Notes (Signed)
Established Patient Office Visit  Subjective   Patient ID: Mark Cross, male    DOB: 1940/10/03  Age: 82 y.o. MRN: [id number]  Chief Complaint  Patient presents with   Follow-up    HTN= also wants to request medication to alleviate diarrhea.    Mark Cross returns to care today for routine follow-up.  Last evaluated by me on 8/12 for HTN follow-up.  Carvedilol 12.5 mg twice daily was added to his antihypertensive regimen.  4-week follow-up was arranged for HTN check.  There have been no acute interval events.  Mark Cross reports feeling well today.  He endorses intermittent diarrhea that is alleviated with OTC antidiarrheal medication.  He does not have any acute concerns to discuss today.  Past Medical History:  Diagnosis Date   DJD (degenerative joint disease), lumbosacral    Hyperlipidemia    On statin   Hypertension    Resistant; on 4 medications.;  Doppler evidence of RAS-but does not meet criteria for RA revascularization by CORAL Trial   Osteoarthritis of right hip    Bone-on-bone pain more severe pain (Mark Cross)   PAD (peripheral artery disease) (HCC) 03/14/2021   ABIs to evaluate right foot pain: R ABI 0.67, TBI 0.34.  L ABI 1.12, TBI 0.52.  Indicates moderate RLE arterial disease.  No significant L LE disease.   RAS (renal artery stenosis) (HCC) 03/2021   Seen by Mark Cross from vascular surgery for bilateral RAS and SMA stenosis given poorly controlled hypertension.  Recommended to continue medical management as he did not meet criteria based on CORAL Trial.   Type 2 diabetes mellitus without complication (HCC)    On metformin only.   Past Surgical History:  Procedure Laterality Date   INGUINAL HERNIA REPAIR Bilateral 08/10/2012   Procedure: HERNIA REPAIR INGUINAL ADULT;  Surgeon: Marlane Hatcher, MD;  Location: [facility];  Service: General;  Laterality: Bilateral;   RIGHT HEART CATH N/A 08/01/2021   Procedure: RIGHT HEART CATH;  Surgeon: Marykay Lex, MD;  Location: [facility] INVASIVE CV LAB:: Severe systemic HTN-SBP range 210-220 mmHg. Mod-Severe Pulm HTN: PAP-mean 51/15 mmHg -32 mmHg. (Avg PAP 60/18 mmHg-mean 38 mmHg), (When SBP increased 200-> 224, PAP increased to 66 mmHg), PCWP 24-28 mmHg; Mean RAP 4 mmHg, RVP-EDP 60/0-9 mmHg.  Cardiac Output Index: 7-4.05 by TD, Fick 5.81-3.37.   TOTAL HIP ARTHROPLASTY Right 10/02/2021   Procedure: TOTAL HIP ARTHROPLASTY ANTERIOR APPROACH;  Surgeon: Sheral Apley, MD;  Location: [facility];  Service: Orthopedics;  Laterality: Right;   Social History   Tobacco Use   Smoking status: Former   Smokeless tobacco: Never  Vaping Use   Vaping status: Never Used  Substance Use Topics   Alcohol use: Not Currently    Comment: seldom   Drug use: No   History reviewed. No pertinent family history. No Known Allergies    Review of Systems  Gastrointestinal:  Positive for diarrhea.  All other systems reviewed and are negative.    Objective:     BP (!) 125/40   Pulse 78   Ht 5' 7.5" (1.715 m)   Wt 133 lb 3.2 oz (60.4 kg)   SpO2 95%   BMI 20.55 kg/m  BP Readings from Last 3 Encounters:  02/10/23 (!) 125/40  12/16/22 (!) 173/54  11/04/22 (!) 177/48   Physical Exam Vitals reviewed.  Constitutional:      General: He is not in acute distress.    Appearance: Normal appearance. He  is not ill-appearing.  HENT:     Head: Normocephalic and atraumatic.     Right Ear: External ear normal.     Left Ear: External ear normal.     Nose: Nose normal. No congestion or rhinorrhea.     Mouth/Throat:     Mouth: Mucous membranes are moist.     Pharynx: Oropharynx is clear.  Eyes:     General: No scleral icterus.    Extraocular Movements: Extraocular movements intact.     Conjunctiva/sclera: Conjunctivae normal.     Pupils: Pupils are equal, round, and reactive to light.  Cardiovascular:     Rate and Rhythm: Normal rate and regular rhythm.     Pulses: Normal pulses.     Heart sounds: Murmur heard.   Pulmonary:     Effort: Pulmonary effort is normal.     Breath sounds: Normal breath sounds. No wheezing, rhonchi or rales.  Abdominal:     General: Abdomen is flat. Bowel sounds are normal. There is no distension.     Palpations: Abdomen is soft.     Tenderness: There is no abdominal tenderness.  Musculoskeletal:        General: No swelling or deformity. Normal range of motion.     Cervical back: Normal range of motion.  Skin:    General: Skin is warm and dry.     Capillary Refill: Capillary refill takes less than 2 seconds.  Neurological:     General: No focal deficit present.     Mental Status: He is alert and oriented to person, place, and time.     Motor: No weakness.  Psychiatric:        Mood and Affect: Mood normal.        Behavior: Behavior normal.        Thought Content: Thought content normal.   Last CBC Lab Results  Component Value Date   WBC 10.0 05/31/2022   HGB 13.1 (A) 05/31/2022   HCT 39 (A) 05/31/2022   MCV 94.5 09/26/2021   MCH 29.7 09/26/2021   RDW 13.8 09/26/2021   PLT 371 05/31/2022   Last metabolic panel Lab Results  Component Value Date   GLUCOSE 197 (H) 03/11/2022   NA 138 10/28/2022   K 5.3 (A) 10/28/2022   CL 103 10/28/2022   CO2 20 10/28/2022   BUN 37 (A) 10/28/2022   CREATININE 2.0 (A) 10/28/2022   EGFR 33 10/28/2022   CALCIUM 9.8 10/28/2022   ALBUMIN 5.0 06/01/2022   ALKPHOS 75 06/01/2022   AST 16 06/01/2022   ALT 11 06/01/2022   ANIONGAP 11 09/26/2021   Last lipids Lab Results  Component Value Date   CHOL 210 (A) 06/01/2022   HDL 44 06/01/2022   LDLCALC 33 06/01/2022   TRIG 187 (A) 06/01/2022   Last hemoglobin A1c Lab Results  Component Value Date   HGBA1C 5.4 11/01/2022   Last vitamin D Lab Results  Component Value Date   VD25OH 17.7 06/01/2022   Last vitamin B12 and Folate Lab Results  Component Value Date   VITAMINB12 1,097 06/01/2022     Assessment & Plan:   Problem List Items Addressed This Visit        Resistant hypertension (Chronic)    Presenting today for HTN follow-up.  Carvedilol 12.5 mg twice daily was added at his last appointment.  He is additionally prescribed amlodipine 10 mg daily, hydralazine 100 mg 3 times daily, lisinopril 40 mg daily, HCTZ 25 mg daily, and Lasix 20  mg daily.  His systolic pressure has improved today, 125/40. -No additional medication changes have been made at this time.  Continue current antihypertensive regimen.      Type 2 diabetes mellitus without complications (HCC)    A1c 5.4 on labs from June.  He is currently prescribed metformin XR 500 mg twice daily and glimepiride 1 mg daily. -No medication changes today. -Repeat urine microalbumin/creatinine ratio ordered      Chronic kidney disease, stage 3b (HCC)    GFR 33 on labs from June, which is decreased from 51 in January.  He has not been evaluated by nephrology previously. -Repeat BMP and urine microalbumin/creatinine ratio ordered -Nephrology referral placed      Return in about 3 months (around 05/13/2023).   Billie Lade, MD

## 2023-02-10 NOTE — Assessment & Plan Note (Signed)
Presenting today for HTN follow-up.  Carvedilol 12.5 mg twice daily was added at his last appointment.  He is additionally prescribed amlodipine 10 mg daily, hydralazine 100 mg 3 times daily, lisinopril 40 mg daily, HCTZ 25 mg daily, and Lasix 20 mg daily.  His systolic pressure has improved today, 125/40. -No additional medication changes have been made at this time.  Continue current antihypertensive regimen.

## 2023-02-10 NOTE — Assessment & Plan Note (Signed)
A1c 5.4 on labs from June.  He is currently prescribed metformin XR 500 mg twice daily and glimepiride 1 mg daily. -No medication changes today. -Repeat urine microalbumin/creatinine ratio ordered

## 2023-02-10 NOTE — Patient Instructions (Signed)
It was a pleasure to see you today.  Thank you for giving [place] the opportunity to be involved in your care.  Below is a brief recap of your visit and next steps.  We will plan to see you again in 3 months.  Summary No medication changes today Repeat labs ordered Nephrology referral placed Follow up in 3 months

## 2023-02-10 NOTE — Assessment & Plan Note (Signed)
GFR 33 on labs from June, which is decreased from 51 in January.  He has not been evaluated by nephrology previously. -Repeat BMP and urine microalbumin/creatinine ratio ordered -Nephrology referral placed

## 2023-02-12 LAB — BASIC METABOLIC PANEL
BUN/Creatinine Ratio: 25 — ABNORMAL HIGH (ref 10–24)
BUN: 41 mg/dL — ABNORMAL HIGH (ref 8–27)
CO2: 20 mmol/L (ref 20–29)
Calcium: 9.3 mg/dL (ref 8.6–10.2)
Chloride: 103 mmol/L (ref 96–106)
Creatinine, Ser: 1.66 mg/dL — ABNORMAL HIGH (ref 0.76–1.27)
Glucose: 105 mg/dL — ABNORMAL HIGH (ref 70–99)
Potassium: 4.7 mmol/L (ref 3.5–5.2)
Sodium: 141 mmol/L (ref 134–144)
eGFR: 41 mL/min/{1.73_m2} — ABNORMAL LOW (ref >59)

## 2023-02-12 LAB — MICROALBUMIN / CREATININE URINE RATIO
Creatinine, Urine: 52.6 mg/dL
Microalb/Creat Ratio: 9 mg/g{creat} (ref 0–29)
Microalbumin, Urine: 4.8 ug/mL

## 2023-04-02 ENCOUNTER — Other Ambulatory Visit: Payer: Self-pay | Admitting: Internal Medicine

## 2023-04-02 DIAGNOSIS — I1A0 Resistant hypertension: Secondary | ICD-10-CM

## 2023-04-04 ENCOUNTER — Other Ambulatory Visit: Payer: Self-pay | Admitting: Internal Medicine

## 2023-04-04 DIAGNOSIS — I1A0 Resistant hypertension: Secondary | ICD-10-CM

## 2023-04-07 ENCOUNTER — Telehealth: Payer: Self-pay

## 2023-04-07 ENCOUNTER — Other Ambulatory Visit: Payer: Self-pay | Admitting: Internal Medicine

## 2023-04-07 DIAGNOSIS — I1A0 Resistant hypertension: Secondary | ICD-10-CM

## 2023-04-07 NOTE — Telephone Encounter (Signed)
Copied from CRM [id number]. Topic: Clinical - Prescription Issue >> Apr 07, 2023  9:59 AM Conni Elliot wrote: Reason for CRM: pt's wife Acel Easter) called in stating pharmacy told pt that rx (carvedilol (COREG) 12.5 MG tablet) would take 2-3 days, it has been over 2-3 days and when pt called in again she was told it would take 2-3 days again, pt is out of rx

## 2023-04-07 NOTE — Telephone Encounter (Signed)
Lmtrc

## 2023-04-18 ENCOUNTER — Other Ambulatory Visit (HOSPITAL_COMMUNITY): Payer: Self-pay | Admitting: Nephrology

## 2023-04-18 DIAGNOSIS — N1832 Chronic kidney disease, stage 3b: Secondary | ICD-10-CM

## 2023-04-18 DIAGNOSIS — E119 Type 2 diabetes mellitus without complications: Secondary | ICD-10-CM

## 2023-04-28 ENCOUNTER — Ambulatory Visit (HOSPITAL_COMMUNITY)
Admission: RE | Admit: 2023-04-28 | Discharge: 2023-04-28 | Disposition: A | Discharge status: Home / Self Care | Payer: Medicare Other | Source: Ambulatory Visit | Attending: Nephrology | Admitting: Nephrology

## 2023-04-28 DIAGNOSIS — N1832 Chronic kidney disease, stage 3b: Secondary | ICD-10-CM | POA: Diagnosis present

## 2023-04-28 DIAGNOSIS — E119 Type 2 diabetes mellitus without complications: Secondary | ICD-10-CM | POA: Diagnosis present

## 2023-05-13 ENCOUNTER — Ambulatory Visit: Payer: Medicare Other | Admitting: Internal Medicine

## 2023-06-13 ENCOUNTER — Encounter: Payer: Self-pay | Admitting: Internal Medicine

## 2023-06-13 ENCOUNTER — Ambulatory Visit: Payer: Medicare Other | Admitting: Internal Medicine

## 2023-06-13 VITALS — BP 152/56 | HR 79 | Ht 67.5 in | Wt 134.8 lb

## 2023-06-13 DIAGNOSIS — Z7984 Long term (current) use of oral hypoglycemic drugs: Secondary | ICD-10-CM

## 2023-06-13 DIAGNOSIS — I1A0 Resistant hypertension: Secondary | ICD-10-CM | POA: Diagnosis not present

## 2023-06-13 DIAGNOSIS — E119 Type 2 diabetes mellitus without complications: Secondary | ICD-10-CM

## 2023-06-13 DIAGNOSIS — N1832 Chronic kidney disease, stage 3b: Secondary | ICD-10-CM | POA: Diagnosis not present

## 2023-06-13 DIAGNOSIS — E1169 Type 2 diabetes mellitus with other specified complication: Secondary | ICD-10-CM

## 2023-06-13 DIAGNOSIS — G8929 Other chronic pain: Secondary | ICD-10-CM | POA: Diagnosis not present

## 2023-06-13 DIAGNOSIS — M545 Low back pain, unspecified: Secondary | ICD-10-CM | POA: Diagnosis not present

## 2023-06-13 LAB — BAYER DCA HB A1C WAIVED: HB A1C (BAYER DCA - WAIVED): 5.9 % — ABNORMAL HIGH (ref 4.8–5.6)

## 2023-06-13 MED ORDER — CARVEDILOL 25 MG PO TABS: 25.0000 mg | ORAL_TABLET | Freq: Two times a day (BID) | ORAL | 3 refills | Status: DC

## 2023-06-13 NOTE — Progress Notes (Signed)
 Established Patient Office Visit  Subjective   Patient ID: Mark Cross, male    DOB: 1940-12-19  Age: 83 y.o. MRN: [id number]  Chief Complaint  Patient presents with   Hypertension    Three month follow up   Mr. Mark Cross returns to care today for routine follow-up.  He was last evaluated by me in October 2024.  No medication changes were made at that time, a referral was placed to nephrology in the setting of CKD stage IIIb, and 41-month follow-up was arranged.  In the interim, he attempted to establish care with nephrology but the provider he was referred to is out of network for his insurance.  There have otherwise been no acute interval events.  Mr. Mark Cross reports feeling fairly well today.  He endorses chronic lumbar back pain.  He does not have any acute concerns or discuss.  Past Medical History:  Diagnosis Date   DJD (degenerative joint disease), lumbosacral    Hyperlipidemia    On statin   Hypertension    Resistant; on 4 medications.;  Doppler evidence of RAS-but does not meet criteria for RA revascularization by CORAL Trial   Osteoarthritis of right hip    Bone-on-bone pain more severe pain (Dr. Evalene Chancy)   PAD (peripheral artery disease) (HCC) 03/14/2021   ABIs to evaluate right foot pain: R ABI 0.67, TBI 0.34.  L ABI 1.12, TBI 0.52.  Indicates moderate RLE arterial disease.  No significant L LE disease.   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 Fonda Simpers from vascular surgery for bilateral RAS and SMA stenosis given poorly controlled hypertension.  Recommended to continue medical management as he did not meet criteria based on CORAL Trial.   Type 2 diabetes mellitus without complication (HCC)    On metformin  only.   Past Surgical History:  Procedure Laterality Date   INGUINAL HERNIA REPAIR Bilateral 08/10/2012   Procedure: HERNIA REPAIR INGUINAL ADULT;  Surgeon: Elsie GORMAN Holland, MD;  Location: [facility];  Service: General;  Laterality: Bilateral;    RIGHT HEART CATH N/A 08/01/2021   Procedure: RIGHT HEART CATH;  Surgeon: Anner Alm ORN, MD;  Location: [facility] INVASIVE CV LAB:: Severe systemic HTN-SBP range 210-220 mmHg. Mod-Severe Pulm HTN: PAP-mean 51/15 mmHg -32 mmHg. (Avg PAP 60/18 mmHg-mean 38 mmHg), (When SBP increased 200-> 224, PAP increased to 66 mmHg), PCWP 24-28 mmHg; Mean RAP 4 mmHg, RVP-EDP 60/0-9 mmHg.  Cardiac Output Index: 7-4.05 by TD, Fick 5.81-3.37.   TOTAL HIP ARTHROPLASTY Right 10/02/2021   Procedure: TOTAL HIP ARTHROPLASTY ANTERIOR APPROACH;  Surgeon: Chancy Mark BIRCH, MD;  Location: [facility];  Service: Orthopedics;  Laterality: Right;   Social History   Tobacco Use   Smoking status: Former   Smokeless tobacco: Never  Vaping Use   Vaping status: Never Used  Substance Use Topics   Alcohol use: Not Currently    Comment: seldom   Drug use: No   History reviewed. No pertinent family history. No Known Allergies  Review of Systems  Constitutional:  Negative for chills and fever.  HENT:  Negative for sore throat.   Respiratory:  Negative for cough and shortness of breath.   Cardiovascular:  Negative for chest pain, palpitations and leg swelling.  Gastrointestinal:  Negative for abdominal pain, blood in stool, constipation, diarrhea, nausea and vomiting.  Genitourinary:  Negative for dysuria and hematuria.  Musculoskeletal:  Positive for back pain (Chronic lumbar back pain). Negative for myalgias.  Skin:  Negative for  itching and rash.  Neurological:  Negative for dizziness and headaches.  Psychiatric/Behavioral:  Negative for depression and suicidal ideas.      Objective:     BP (!) 152/56   Pulse 79   Ht 5' 7.5 (1.715 m)   Wt 134 lb 12.8 oz (61.1 kg)   SpO2 95%   BMI 20.80 kg/m  BP Readings from Last 3 Encounters:  06/13/23 (!) 152/56  02/10/23 (!) 125/40  12/16/22 (!) 173/54   Physical Exam Vitals reviewed.  Constitutional:      General: He is not in acute distress.    Appearance: Normal  appearance. He is not ill-appearing.  HENT:     Head: Normocephalic and atraumatic.     Right Ear: External ear normal.     Left Ear: External ear normal.     Nose: Nose normal. No congestion or rhinorrhea.     Mouth/Throat:     Mouth: Mucous membranes are moist.     Pharynx: Oropharynx is clear.  Eyes:     General: No scleral icterus.    Extraocular Movements: Extraocular movements intact.     Conjunctiva/sclera: Conjunctivae normal.     Pupils: Pupils are equal, round, and reactive to light.  Cardiovascular:     Rate and Rhythm: Normal rate and regular rhythm.     Pulses: Normal pulses.     Heart sounds: Murmur heard.  Pulmonary:     Effort: Pulmonary effort is normal.     Breath sounds: Normal breath sounds. No wheezing, rhonchi or rales.  Abdominal:     General: Abdomen is flat. Bowel sounds are normal. There is no distension.     Palpations: Abdomen is soft.     Tenderness: There is no abdominal tenderness.  Musculoskeletal:        General: No swelling or deformity. Normal range of motion.     Cervical back: Normal range of motion.  Skin:    General: Skin is warm and dry.     Capillary Refill: Capillary refill takes less than 2 seconds.  Neurological:     General: No focal deficit present.     Mental Status: He is alert and oriented to person, place, and time.     Motor: No weakness.     Gait: Gait abnormal (Ambulates with a cane).  Psychiatric:        Mood and Affect: Mood normal.        Behavior: Behavior normal.        Thought Content: Thought content normal.   Last CBC Lab Results  Component Value Date   WBC 10.0 05/31/2022   HGB 13.1 (A) 05/31/2022   HCT 39 (A) 05/31/2022   MCV 94.5 09/26/2021   MCH 29.7 09/26/2021   RDW 13.8 09/26/2021   PLT 371 05/31/2022   Last metabolic panel Lab Results  Component Value Date   GLUCOSE 105 (H) 02/10/2023   NA 141 02/10/2023   K 4.7 02/10/2023   CL 103 02/10/2023   CO2 20 02/10/2023   BUN 41 (H) 02/10/2023    CREATININE 1.66 (H) 02/10/2023   EGFR 41 (L) 02/10/2023   CALCIUM 9.3 02/10/2023   ALBUMIN  5.0 06/01/2022   ALKPHOS 75 06/01/2022   AST 16 06/01/2022   ALT 11 06/01/2022   ANIONGAP 11 09/26/2021   Last lipids Lab Results  Component Value Date   CHOL 210 (A) 06/01/2022   HDL 44 06/01/2022   LDLCALC 33 06/01/2022   TRIG 187 (A) 06/01/2022  Last hemoglobin A1c Lab Results  Component Value Date   HGBA1C 5.4 11/01/2022   Last vitamin D  Lab Results  Component Value Date   VD25OH 17.7 06/01/2022   Last vitamin B12 and Folate Lab Results  Component Value Date   VITAMINB12 1,097 06/01/2022     Assessment & Plan:   Problem List Items Addressed This Visit       Resistant hypertension - Primary (Chronic)   Documented history of resistant hypertension.  BP remains elevated today.  Current antihypertensive regimen includes amlodipine  10 mg daily, lisinopril  40 mg daily, hydralazine  100 mg 3 times daily, HCTZ 25 mg daily, carvedilol  12.5 mg daily, and Lasix  20 mg daily.  He endorses compliance with this regimen. -Increase carvedilol  to 25 mg twice daily.  He will continue to check his blood pressure regularly at home.      Type 2 diabetes mellitus without complications (HCC)   A1c 5.4 on labs from June 2024.  He continues to take metformin  XR 500 mg twice daily and glimepiride  1 mg daily.  Repeat A1c ordered today.      Chronic kidney disease, stage 3b [facility])   Previously referred to nephrology but states that the provider he was referred to is not in network for his insurance.  Will discuss with referral coordinator and send a nephrology referral to a different provider.      Chronic lumbar pain   Mr. Okazaki endorses chronic lumbar back pain.  He has a previously documented history of multilevel degenerative changes and neuroforaminal stenosis.  Previously taking oxycodone -acetaminophen  10-325 mg up to 4 times daily as needed for severe pain relief.  PDMP reviewed.  Prescription  was last refilled in June 2024.  We have previously discussed a referral to pain management but Mr. Wann says he never received a call to establish care.  He states that he recently ran out of Percocet and after reducing the frequency to once daily.  He would like to continue Percocet as needed for severe pain relief if possible. -Through shared decision making, I will prescribe oxycodone -acetaminophen  10-325 mg daily as needed for severe pain relief pending completion of UDS and controlled substance agreement.  Mr. Capistran is in agreement with this plan.       Return in about 3 months (around 09/10/2023).    Manus FORBES Fireman, MD

## 2023-06-13 NOTE — Assessment & Plan Note (Signed)
 Mark Cross endorses chronic lumbar back pain.  He has a previously documented history of multilevel degenerative changes and neuroforaminal stenosis.  Previously taking oxycodone -acetaminophen  10-325 mg up to 4 times daily as needed for severe pain relief.  PDMP reviewed.  Prescription was last refilled in June 2024.  We have previously discussed a referral to pain management but [name] says he never received a call to establish care.  He states that he recently ran out of Percocet and after reducing the frequency to once daily.  He would like to continue Percocet as needed for severe pain relief if possible. -Through shared decision making, I will prescribe oxycodone -acetaminophen  10-325 mg daily as needed for severe pain relief pending completion of UDS and controlled substance agreement.  [name] is in agreement with this plan.

## 2023-06-13 NOTE — Assessment & Plan Note (Signed)
 Documented history of resistant hypertension.  BP remains elevated today.  Current antihypertensive regimen includes amlodipine  10 mg daily, lisinopril  40 mg daily, hydralazine  100 mg 3 times daily, HCTZ 25 mg daily, carvedilol  12.5 mg daily, and Lasix  20 mg daily.  He endorses compliance with this regimen. -Increase carvedilol  to 25 mg twice daily.  He will continue to check his blood pressure regularly at home.

## 2023-06-13 NOTE — Patient Instructions (Addendum)
 It was a pleasure to see you today.  Thank you for giving us  the opportunity to be involved in your care.  Below is a brief recap of your visit and next steps.  We will plan to see you again in 3 months.  Summary Increase carvedilol  to 25 mg twice daily Will resume once daily pain medication pending controlled substance agreement and urine drug screen Will follow up on kidney specialist referral Follow up in 3 months Repeat A1c

## 2023-06-13 NOTE — Assessment & Plan Note (Signed)
 Previously referred to nephrology but states that the provider he was referred to is not in network for his insurance.  Will discuss with referral coordinator and send a nephrology referral to a different provider.

## 2023-06-13 NOTE — Assessment & Plan Note (Signed)
 A1c 5.4 on labs from June 2024.  He continues to take metformin  XR 500 mg twice daily and glimepiride 1 mg daily.  Repeat A1c ordered today.

## 2023-06-16 ENCOUNTER — Other Ambulatory Visit: Payer: Self-pay

## 2023-06-16 DIAGNOSIS — N1832 Chronic kidney disease, stage 3b: Secondary | ICD-10-CM

## 2023-06-18 ENCOUNTER — Encounter: Payer: Self-pay | Admitting: Internal Medicine

## 2023-06-18 ENCOUNTER — Other Ambulatory Visit: Payer: Self-pay | Admitting: Internal Medicine

## 2023-06-18 DIAGNOSIS — G8929 Other chronic pain: Secondary | ICD-10-CM

## 2023-06-18 LAB — TOXASSURE SELECT 13 (MW), URINE

## 2023-06-18 MED ORDER — OXYCODONE-ACETAMINOPHEN 10-325 MG PO TABS: 1.0000 | ORAL_TABLET | Freq: Every day | ORAL | 0 refills | Status: DC | PRN

## 2023-07-16 ENCOUNTER — Ambulatory Visit (HOSPITAL_COMMUNITY)
Admission: RE | Admit: 2023-07-16 | Discharge: 2023-07-16 | Disposition: A | Discharge status: Home / Self Care | Source: Ambulatory Visit | Attending: Family Medicine | Admitting: Family Medicine

## 2023-07-16 ENCOUNTER — Encounter: Payer: Self-pay | Admitting: Family Medicine

## 2023-07-16 ENCOUNTER — Ambulatory Visit (INDEPENDENT_AMBULATORY_CARE_PROVIDER_SITE_OTHER): Admitting: Family Medicine

## 2023-07-16 VITALS — BP 127/53 | HR 66 | Resp 16 | Ht 67.5 in | Wt 133.4 lb

## 2023-07-16 DIAGNOSIS — I1A0 Resistant hypertension: Secondary | ICD-10-CM

## 2023-07-16 DIAGNOSIS — M5442 Lumbago with sciatica, left side: Secondary | ICD-10-CM

## 2023-07-16 DIAGNOSIS — R1031 Right lower quadrant pain: Secondary | ICD-10-CM

## 2023-07-16 DIAGNOSIS — G8929 Other chronic pain: Secondary | ICD-10-CM

## 2023-07-16 DIAGNOSIS — M1612 Unilateral primary osteoarthritis, left hip: Secondary | ICD-10-CM | POA: Diagnosis not present

## 2023-07-16 DIAGNOSIS — M25552 Pain in left hip: Secondary | ICD-10-CM | POA: Insufficient documentation

## 2023-07-16 MED ORDER — PREDNISONE 20 MG PO TABS: 20.0000 mg | ORAL_TABLET | Freq: Two times a day (BID) | ORAL | 0 refills | Status: AC

## 2023-07-16 NOTE — Assessment & Plan Note (Addendum)
 3 week increased pain radiaiting down left leg, refer ortho, shport course pedniosne prescribed

## 2023-07-16 NOTE — Patient Instructions (Addendum)
 Follow-up with Dr. Durwin Nora as before, call if you need to be seen sooner.  Please get an x-ray of your left hip today  go straight to the hospital today radiology department the order is entered.  You will be contacted with the result.  5-day course of prednisone is prescribed for acute left hip and low back pain and left leg pain  I have referred you on an urgent/as soon as possible basis to your orthopedic surgeon in [place] for reevaluation and follow-up of your acute left leg and left hip pain.  Dr Durwin Nora wil be in touch with you regarding concern of  right inguinal hernia  Thanks for choosing [facility], we consider it a privelige to serve you.

## 2023-07-16 NOTE — Assessment & Plan Note (Addendum)
 3 week history, felt a pop , x ray on day of visit , negative for fracture

## 2023-07-20 DIAGNOSIS — R1031 Right lower quadrant pain: Secondary | ICD-10-CM | POA: Insufficient documentation

## 2023-07-20 NOTE — Assessment & Plan Note (Signed)
 Reports intermittent episodes of pain, associated with recurrent hernia. Advised to f/u with PCP, if pain and distension persist then ED due to potential of obstruction, will alert PCP to the complaint

## 2023-07-20 NOTE — Progress Notes (Signed)
   OAKLEY KOSSMAN     MRN: [id number]      DOB: December 15, 1940  Chief Complaint  Patient presents with   Leg Pain    X 3 weeks, while lifting trash can into truck he felt a pop in left outer hip area, its a burning pain in left leg and thigh, sometimes top of left foot. Rates pain today 8/10    HPI Mr. Crumpler is here with above concerns ROS Denies recent fever or chills. Denies sinus pressure, nasal congestion, ear pain or sore throat. Denies chest congestion, productive cough or wheezing. Denies chest pains, palpitations and leg swelling Denies headaches, seizures, numbness, or tingling.  Denies skin break down or rash.   PE  BP (!) 127/53   Pulse 66   Resp 16   Ht 5' 7.5" (1.715 m)   Wt 133 lb 6.4 oz (60.5 kg)   SpO2 95%   BMI 20.59 kg/m   Patient alert and oriented and in no cardiopulmonary distress.  HEENT: No facial asymmetry, EOMI,     Neck supple .  Chest: Clear to auscultation bilaterally.Decreased air entry  CVS: S1, S2 systolic  murmurs no S3.Regular rate.  ABD: Soft non tender.   Ext: No edema  MS: decreased  ROM spine,and left  hip Skin: Intact, no ulcerations or rash noted.  Psych: Good eye contact, normal affect. Memory intact not anxious or depressed appearing.  CNS: CN 2-12 intact, power,  normal throughout.no focal deficits noted.   Assessment & Plan  Left hip pain 3 week history, felt a pop , x ray on day of visit , negative for fracture  Chronic lumbar pain 3 week increased pain radiaiting down left leg, refer ortho, shport course pedniosne prescribed  Resistant hypertension Controlled, no change in medication   Right groin pain Reports intermittent episodes of pain, associated with recurrent hernia. Advised to f/u with PCP, if pain and distension persist then ED due to potential of obstruction, will alert PCP to the complaint

## 2023-07-20 NOTE — Assessment & Plan Note (Signed)
 Controlled, no change in medication

## 2023-08-18 ENCOUNTER — Other Ambulatory Visit: Payer: Self-pay

## 2023-08-18 MED ORDER — METFORMIN HCL ER 500 MG PO TB24: 500.0000 mg | ORAL_TABLET | Freq: Two times a day (BID) | ORAL | 0 refills | Status: DC

## 2023-09-12 ENCOUNTER — Ambulatory Visit (INDEPENDENT_AMBULATORY_CARE_PROVIDER_SITE_OTHER): Payer: Medicare Other | Admitting: Internal Medicine

## 2023-09-12 ENCOUNTER — Encounter: Payer: Self-pay | Admitting: Internal Medicine

## 2023-09-12 VITALS — BP 138/58 | HR 69 | Ht 67.5 in | Wt 134.8 lb

## 2023-09-12 DIAGNOSIS — E1169 Type 2 diabetes mellitus with other specified complication: Secondary | ICD-10-CM

## 2023-09-12 DIAGNOSIS — E785 Hyperlipidemia, unspecified: Secondary | ICD-10-CM

## 2023-09-12 DIAGNOSIS — M5442 Lumbago with sciatica, left side: Secondary | ICD-10-CM | POA: Diagnosis not present

## 2023-09-12 DIAGNOSIS — I1A0 Resistant hypertension: Secondary | ICD-10-CM | POA: Diagnosis not present

## 2023-09-12 DIAGNOSIS — Z7984 Long term (current) use of oral hypoglycemic drugs: Secondary | ICD-10-CM

## 2023-09-12 DIAGNOSIS — N1832 Chronic kidney disease, stage 3b: Secondary | ICD-10-CM | POA: Diagnosis not present

## 2023-09-12 DIAGNOSIS — G8929 Other chronic pain: Secondary | ICD-10-CM | POA: Diagnosis not present

## 2023-09-12 DIAGNOSIS — E119 Type 2 diabetes mellitus without complications: Secondary | ICD-10-CM

## 2023-09-12 NOTE — Assessment & Plan Note (Signed)
 Oxycodone -acetaminophen  10-325 mg daily as needed for severe pain relief was prescribed at his last appointment.  He states that this is mildly effective and he has not been taking it recently.  Prednisone  was effective for alleviating back pain flare in March.  Through shared decision making, oxycodone -acetaminophen  has been discontinued today.

## 2023-09-12 NOTE — Assessment & Plan Note (Signed)
 Carvedilol  was increased to 25 mg twice daily at his last appointment.  BP today is controlled at 138/58.  No additional medication changes are indicated at this time.  Continue current antihypertensive regimen.

## 2023-09-12 NOTE — Assessment & Plan Note (Signed)
 CKD stage IIIb.  He has previously been referred to nephrology but has not been able to establish care.  Will follow-up on the status of his most recent referral.

## 2023-09-12 NOTE — Assessment & Plan Note (Signed)
 A1c 5.9 on labs from February.  He remains on metformin  XR and glimepiride.  No changes are indicated today.

## 2023-09-12 NOTE — Patient Instructions (Signed)
It was a pleasure to see you today.  Thank you for giving [place] the opportunity to be involved in your care.  Below is a brief recap of your visit and next steps.  We will plan to see you again in 6 months  Summary No medication changes today Follow up in 6 months

## 2023-09-12 NOTE — Progress Notes (Signed)
 Established Patient Office Visit  Subjective   Patient ID: Mark Cross, male    DOB: 07-30-1940  Age: 83 y.o. MRN: [id number]  Chief Complaint  Patient presents with   Hypertension    Three month follow up   Mr. Mark Cross returns to care today for routine follow-up.  He was last evaluated by me on 2/7.  Carvedilol  was increased to 25 mg twice daily in the setting of resistant hypertension.  I also agreed to prescribe oxycodone -acetaminophen  10-325 mg at a frequency of once daily as needed for severe pain relief.  31-month follow-up was arranged for reassessment.  In the interim, he was evaluated at [facility] on 3/12 for an acute visit in the setting of leg pain x 3 weeks.  Treated conservatively..  There have otherwise been no acute interval events. Mr. Mark Cross reports feeling well today. He is asymptomatic and has no acute concerns to discuss.   Past Medical History:  Diagnosis Date   DJD (degenerative joint disease), lumbosacral    Hyperlipidemia    On statin   Hypertension    Resistant; on 4 medications.;  Doppler evidence of RAS-but does not meet criteria for RA revascularization by CORAL Trial   Osteoarthritis of right hip    Bone-on-bone pain more severe pain (Dr. Randal Bury)   PAD (peripheral artery disease) (HCC) 03/14/2021   ABIs to evaluate right foot pain: R ABI 0.67, TBI 0.34.  L ABI 1.12, TBI 0.52.  Indicates moderate RLE arterial disease.  No significant L LE disease.   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 Rozanna Corner from vascular surgery for bilateral RAS and SMA stenosis given poorly controlled hypertension.  Recommended to continue medical management as he did not meet criteria based on CORAL Trial.   Type 2 diabetes mellitus without complication (HCC)    On metformin  only.   Past Surgical History:  Procedure Laterality Date   INGUINAL HERNIA REPAIR Bilateral 08/10/2012   Procedure: HERNIA REPAIR INGUINAL ADULT;  Surgeon: Myrl Askew, MD;   Location: [facility];  Service: General;  Laterality: Bilateral;   RIGHT HEART CATH N/A 08/01/2021   Procedure: RIGHT HEART CATH;  Surgeon: Arleen Lacer, MD;  Location: [facility] INVASIVE CV LAB:: Severe systemic HTN-SBP range 210-220 mmHg. Mod-Severe Pulm HTN: PAP-mean 51/15 mmHg -32 mmHg. (Avg PAP 60/18 mmHg-mean 38 mmHg), (When SBP increased 200-> 224, PAP increased to 66 mmHg), PCWP 24-28 mmHg; Mean RAP 4 mmHg, RVP-EDP 60/0-9 mmHg.  Cardiac Output Index: 7-4.05 by TD, Fick 5.81-3.37.   TOTAL HIP ARTHROPLASTY Right 10/02/2021   Procedure: TOTAL HIP ARTHROPLASTY ANTERIOR APPROACH;  Surgeon: Saundra Curl, MD;  Location: [facility];  Service: Orthopedics;  Laterality: Right;   Social History   Tobacco Use   Smoking status: Former   Smokeless tobacco: Never  Vaping Use   Vaping status: Never Used  Substance Use Topics   Alcohol use: Not Currently    Comment: seldom   Drug use: No   History reviewed. No pertinent family history. No Known Allergies  Review of Systems  Constitutional:  Negative for chills and fever.  HENT:  Negative for sore throat.   Respiratory:  Negative for cough and shortness of breath.   Cardiovascular:  Negative for chest pain, palpitations and leg swelling.  Gastrointestinal:  Negative for abdominal pain, blood in stool, constipation, diarrhea, nausea and vomiting.  Genitourinary:  Negative for dysuria and hematuria.  Musculoskeletal:  Negative for myalgias.  Skin:  Negative  for itching and rash.  Neurological:  Negative for dizziness and headaches.  Psychiatric/Behavioral:  Negative for depression and suicidal ideas.      Objective:     BP (!) 138/58   Pulse 69   Ht 5' 7.5" (1.715 m)   Wt 134 lb 12.8 oz (61.1 kg)   SpO2 93%   BMI 20.80 kg/m  BP Readings from Last 3 Encounters:  09/12/23 (!) 138/58  07/16/23 (!) 127/53  06/13/23 (!) 152/56   Physical Exam Vitals reviewed.  Constitutional:      General: He is not in acute distress.    Appearance:  Normal appearance. He is not ill-appearing.  HENT:     Head: Normocephalic and atraumatic.     Right Ear: External ear normal.     Left Ear: External ear normal.     Nose: Nose normal. No congestion or rhinorrhea.     Mouth/Throat:     Mouth: Mucous membranes are moist.     Pharynx: Oropharynx is clear.  Eyes:     General: No scleral icterus.    Extraocular Movements: Extraocular movements intact.     Conjunctiva/sclera: Conjunctivae normal.     Pupils: Pupils are equal, round, and reactive to light.  Cardiovascular:     Rate and Rhythm: Normal rate and regular rhythm.     Pulses: Normal pulses.     Heart sounds: Murmur heard.  Pulmonary:     Effort: Pulmonary effort is normal.     Breath sounds: Normal breath sounds. No wheezing, rhonchi or rales.  Abdominal:     General: Abdomen is flat. Bowel sounds are normal. There is no distension.     Palpations: Abdomen is soft.     Tenderness: There is no abdominal tenderness.  Musculoskeletal:        General: No swelling or deformity. Normal range of motion.     Cervical back: Normal range of motion.  Skin:    General: Skin is warm and dry.     Capillary Refill: Capillary refill takes less than 2 seconds.  Neurological:     General: No focal deficit present.     Mental Status: He is alert and oriented to person, place, and time.     Motor: No weakness.     Gait: Gait abnormal (ambulates with a cane).  Psychiatric:        Mood and Affect: Mood normal.        Behavior: Behavior normal.        Thought Content: Thought content normal.   Last CBC Lab Results  Component Value Date   WBC 10.0 05/31/2022   HGB 13.1 (A) 05/31/2022   HCT 39 (A) 05/31/2022   MCV 94.5 09/26/2021   MCH 29.7 09/26/2021   RDW 13.8 09/26/2021   PLT 371 05/31/2022   Last metabolic panel Lab Results  Component Value Date   GLUCOSE 105 (H) 02/10/2023   NA 141 02/10/2023   K 4.7 02/10/2023   CL 103 02/10/2023   CO2 20 02/10/2023   BUN 41 (H)  02/10/2023   CREATININE 1.66 (H) 02/10/2023   EGFR 41 (L) 02/10/2023   CALCIUM 9.3 02/10/2023   ALBUMIN  5.0 06/01/2022   ALKPHOS 75 06/01/2022   AST 16 06/01/2022   ALT 11 06/01/2022   ANIONGAP 11 09/26/2021   Last lipids Lab Results  Component Value Date   CHOL 210 (A) 06/01/2022   HDL 44 06/01/2022   LDLCALC 33 06/01/2022   TRIG 187 (A) 06/01/2022  Last hemoglobin A1c Lab Results  Component Value Date   HGBA1C 5.9 (H) 06/13/2023   Last vitamin D  Lab Results  Component Value Date   VD25OH 17.7 06/01/2022   Last vitamin B12 and Folate Lab Results  Component Value Date   VITAMINB12 1,097 06/01/2022     Assessment & Plan:   Problem List Items Addressed This Visit       Resistant hypertension - Primary (Chronic)   Carvedilol  was increased to 25 mg twice daily at his last appointment.  BP today is controlled at 138/58.  No additional medication changes are indicated at this time.  Continue current antihypertensive regimen.      Hyperlipidemia associated with type 2 diabetes mellitus (HCC) (Chronic)   Lipid panel last updated in January 2024.  Total cholesterol 210 and LDL 33.  He remains on simvastatin  40 mg daily.  He declined repeat labs today.      Type 2 diabetes mellitus without complications (HCC)   A1c 5.9 on labs from February.  He remains on metformin  XR and glimepiride.  No changes are indicated today.      Chronic kidney disease, stage 3b (HCC)   CKD stage IIIb.  He has previously been referred to nephrology but has not been able to establish care.  Will follow-up on the status of his most recent referral.      Chronic lumbar pain   Oxycodone -acetaminophen  10-325 mg daily as needed for severe pain relief was prescribed at his last appointment.  He states that this is mildly effective and he has not been taking it recently.  Prednisone  was effective for alleviating back pain flare in March.  Through shared decision making, oxycodone -acetaminophen  has  been discontinued today.       Return in about 6 months (around 03/14/2024).    Tobi Fortes, MD

## 2023-09-12 NOTE — Assessment & Plan Note (Signed)
 Lipid panel last updated in January 2024.  Total cholesterol 210 and LDL 33.  He remains on simvastatin  40 mg daily.  He declined repeat labs today.

## 2023-10-19 ENCOUNTER — Other Ambulatory Visit: Payer: Self-pay | Admitting: Internal Medicine

## 2023-11-10 ENCOUNTER — Telehealth: Payer: Self-pay | Admitting: General Practice

## 2023-11-10 DIAGNOSIS — N1832 Chronic kidney disease, stage 3b: Secondary | ICD-10-CM | POA: Diagnosis not present

## 2023-11-10 DIAGNOSIS — I1A0 Resistant hypertension: Secondary | ICD-10-CM | POA: Diagnosis not present

## 2023-11-10 DIAGNOSIS — I129 Hypertensive chronic kidney disease with stage 1 through stage 4 chronic kidney disease, or unspecified chronic kidney disease: Secondary | ICD-10-CM | POA: Diagnosis not present

## 2023-11-10 DIAGNOSIS — E1122 Type 2 diabetes mellitus with diabetic chronic kidney disease: Secondary | ICD-10-CM | POA: Diagnosis not present

## 2023-11-10 LAB — LAB REPORT - SCANNED
Calcium: 9.4
EGFR: 42
PTH: 46

## 2023-11-10 NOTE — Telephone Encounter (Signed)
 Patient wife states he is almost out of all medications that he is prescribed by Dr Melvenia. [facility] is the pharmacy. Call with any questions Thank you

## 2023-11-11 ENCOUNTER — Other Ambulatory Visit: Payer: Self-pay

## 2023-11-11 DIAGNOSIS — E1169 Type 2 diabetes mellitus with other specified complication: Secondary | ICD-10-CM

## 2023-11-11 DIAGNOSIS — I1A0 Resistant hypertension: Secondary | ICD-10-CM

## 2023-11-11 DIAGNOSIS — E119 Type 2 diabetes mellitus without complications: Secondary | ICD-10-CM

## 2023-11-11 MED ORDER — FUROSEMIDE 20 MG PO TABS: 20.0000 mg | ORAL_TABLET | Freq: Every day | ORAL | 3 refills | Status: AC

## 2023-11-11 MED ORDER — HYDRALAZINE HCL 100 MG PO TABS: 100.0000 mg | ORAL_TABLET | Freq: Three times a day (TID) | ORAL | 3 refills | Status: AC

## 2023-11-11 MED ORDER — SIMVASTATIN 40 MG PO TABS: 40.0000 mg | ORAL_TABLET | Freq: Every morning | ORAL | 3 refills | Status: AC

## 2023-11-11 MED ORDER — AMLODIPINE BESYLATE 10 MG PO TABS: 10.0000 mg | ORAL_TABLET | Freq: Every day | ORAL | 3 refills | Status: AC

## 2023-11-11 MED ORDER — LISINOPRIL 40 MG PO TABS: 40.0000 mg | ORAL_TABLET | Freq: Every morning | ORAL | 3 refills | Status: AC

## 2023-11-11 MED ORDER — METFORMIN HCL ER 500 MG PO TB24: 500.0000 mg | ORAL_TABLET | Freq: Two times a day (BID) | ORAL | 3 refills | Status: AC

## 2023-11-11 MED ORDER — HYDROCHLOROTHIAZIDE 25 MG PO TABS: 25.0000 mg | ORAL_TABLET | Freq: Every day | ORAL | 2 refills | Status: DC

## 2023-11-11 MED ORDER — GLIMEPIRIDE 1 MG PO TABS: 1.0000 mg | ORAL_TABLET | Freq: Every day | ORAL | 3 refills | Status: AC

## 2023-11-11 MED ORDER — CARVEDILOL 25 MG PO TABS: 25.0000 mg | ORAL_TABLET | Freq: Two times a day (BID) | ORAL | 3 refills | Status: AC

## 2023-11-12 NOTE — Telephone Encounter (Signed)
Advised with verbal understanding

## 2023-12-05 DIAGNOSIS — Z6820 Body mass index (BMI) 20.0-20.9, adult: Secondary | ICD-10-CM | POA: Diagnosis not present

## 2023-12-05 DIAGNOSIS — R030 Elevated blood-pressure reading, without diagnosis of hypertension: Secondary | ICD-10-CM | POA: Diagnosis not present

## 2023-12-05 DIAGNOSIS — H6123 Impacted cerumen, bilateral: Secondary | ICD-10-CM | POA: Diagnosis not present

## 2023-12-19 ENCOUNTER — Encounter: Payer: Self-pay | Admitting: Nurse Practitioner

## 2023-12-19 ENCOUNTER — Ambulatory Visit (INDEPENDENT_AMBULATORY_CARE_PROVIDER_SITE_OTHER): Admitting: Nurse Practitioner

## 2023-12-19 VITALS — BP 132/44 | HR 65 | Ht 67.5 in | Wt 132.0 lb

## 2023-12-19 DIAGNOSIS — H6123 Impacted cerumen, bilateral: Secondary | ICD-10-CM

## 2023-12-19 DIAGNOSIS — H9203 Otalgia, bilateral: Secondary | ICD-10-CM

## 2023-12-19 DIAGNOSIS — J309 Allergic rhinitis, unspecified: Secondary | ICD-10-CM | POA: Diagnosis not present

## 2023-12-19 MED ORDER — FLUTICASONE PROPIONATE 50 MCG/ACT NA SUSP: 2.0000 | Freq: Every day | NASAL | 0 refills | Status: AC

## 2023-12-19 NOTE — Progress Notes (Signed)
 Established Patient Office Visit  Subjective:  Patient ID: Mark Cross, male    DOB: 1940/09/24  Age: 83 y.o. MRN: [id number]  Chief Complaint  Patient presents with   Ear Fullness    bilateral    Patient here today for an acute visit reporting he cannot hear out of either ear and had ear irrigation performed at an urgent care two weeks ago.  He also reports his nasal passages are congested.  Today he cannot breathe thru his right nostril.    Ear Fullness     No other concerns at this time.   Past Medical History:  Diagnosis Date   DJD (degenerative joint disease), lumbosacral    Hyperlipidemia    On statin   Hypertension    Resistant; on 4 medications.;  Doppler evidence of RAS-but does not meet criteria for RA revascularization by CORAL Trial   Osteoarthritis of right hip    Bone-on-bone pain more severe pain (Dr. Evalene Chancy)   PAD (peripheral artery disease) (HCC) 03/14/2021   ABIs to evaluate right foot pain: R ABI 0.67, TBI 0.34.  L ABI 1.12, TBI 0.52.  Indicates moderate RLE arterial disease.  No significant L LE disease.   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 Fonda Simpers from vascular surgery for bilateral RAS and SMA stenosis given poorly controlled hypertension.  Recommended to continue medical management as he did not meet criteria based on CORAL Trial.   Type 2 diabetes mellitus without complication (HCC)    On metformin  only.    Past Surgical History:  Procedure Laterality Date   INGUINAL HERNIA REPAIR Bilateral 08/10/2012   Procedure: HERNIA REPAIR INGUINAL ADULT;  Surgeon: Elsie GORMAN Holland, MD;  Location: [facility];  Service: General;  Laterality: Bilateral;   RIGHT HEART CATH N/A 08/01/2021   Procedure: RIGHT HEART CATH;  Surgeon: Anner Alm ORN, MD;  Location: [facility] INVASIVE CV LAB:: Severe systemic HTN-SBP range 210-220 mmHg. Mod-Severe Pulm HTN: PAP-mean 51/15 mmHg -32 mmHg. (Avg PAP 60/18 mmHg-mean 38 mmHg), (When SBP increased  200-> 224, PAP increased to 66 mmHg), PCWP 24-28 mmHg; Mean RAP 4 mmHg, RVP-EDP 60/0-9 mmHg.  Cardiac Output Index: 7-4.05 by TD, Fick 5.81-3.37.   TOTAL HIP ARTHROPLASTY Right 10/02/2021   Procedure: TOTAL HIP ARTHROPLASTY ANTERIOR APPROACH;  Surgeon: [name], MD;  Location: [facility];  Service: Orthopedics;  Laterality: Right;    Social History   Socioeconomic History   Marital status: Married    Spouse name: Not on file   Number of children: Not on file   Years of education: Not on file   Highest education level: Not on file  Occupational History   Not on file  Tobacco Use   Smoking status: Former   Smokeless tobacco: Never  Vaping Use   Vaping status: Never Used  Substance and Sexual Activity   Alcohol use: Not Currently    Comment: seldom   Drug use: No   Sexual activity: Not on file  Other Topics Concern   Not on file  Social History Narrative   Not on file   Social Drivers of Health   Financial Resource Strain: Not on file  Food Insecurity: Not on file  Transportation Needs: Not on file  Physical Activity: Not on file  Stress: Not on file  Social Connections: Not on file  Intimate Partner Violence: Not on file    No family history on file.  No Known Allergies  Outpatient Medications  Prior to Visit  Medication Sig   acetaminophen  (TYLENOL ) 500 MG tablet Take 2 tablets (1,000 mg total) by mouth every 6 (six) hours as needed for mild pain or moderate pain.   amLODipine  (NORVASC ) 10 MG tablet Take 1 tablet (10 mg total) by mouth daily.   carvedilol  (COREG ) 25 MG tablet Take 1 tablet (25 mg total) by mouth 2 (two) times daily with a meal.   escitalopram (LEXAPRO) 10 MG tablet Take 10 mg by mouth daily.   ferrous sulfate 325 (65 FE) MG tablet Take 325 mg by mouth in the morning.   furosemide  (LASIX ) 20 MG tablet Take 1 tablet (20 mg total) by mouth daily.   glimepiride  (AMARYL ) 1 MG tablet Take 1 tablet (1 mg total) by mouth daily.   hydrALAZINE   (APRESOLINE ) 100 MG tablet Take 1 tablet (100 mg total) by mouth 3 (three) times daily.   hydrochlorothiazide  (HYDRODIURIL ) 25 MG tablet Take 1 tablet (25 mg total) by mouth daily.   lisinopril  (ZESTRIL ) 40 MG tablet Take 1 tablet (40 mg total) by mouth in the morning.   metFORMIN  (GLUCOPHAGE -XR) 500 MG 24 hr tablet Take 1 tablet (500 mg total) by mouth 2 (two) times daily with a meal.   predniSONE  (DELTASONE ) 20 MG tablet Take 1 tablet (20 mg total) by mouth 2 (two) times daily with a meal.   simvastatin  (ZOCOR ) 40 MG tablet Take 1 tablet (40 mg total) by mouth in the morning.   vitamin B-12 (CYANOCOBALAMIN) 1000 MCG tablet Take 1,000 mcg by mouth in the morning.   Facility-Administered Medications Prior to Visit  Medication Dose Route Frequency Provider   sodium chloride  flush (NS) 0.9 % injection 3 mL  3 mL Intravenous Q12H Monge, Emily C, NP    ROS     Objective:   BP (!) 132/44   Pulse 65   Ht 5' 7.5 (1.715 m)   Wt 132 lb (59.9 kg)   BMI 20.37 kg/m   Vitals:   12/19/23 1052  BP: (!) 132/44  Pulse: 65  Height: 5' 7.5 (1.715 m)  Weight: 132 lb (59.9 kg)  BMI (Calculated): 20.36    Physical Exam Vitals and nursing note reviewed.  Constitutional:      Appearance: Normal appearance.  HENT:     Head: Normocephalic.     Right Ear: There is impacted cerumen.     Left Ear: There is impacted cerumen.     Nose: Congestion present.     Mouth/Throat:     Mouth: Mucous membranes are dry.  Cardiovascular:     Rate and Rhythm: Normal rate and regular rhythm.     Pulses: Normal pulses.     Heart sounds: Normal heart sounds.  Pulmonary:     Effort: Pulmonary effort is normal.     Breath sounds: Normal breath sounds.  Musculoskeletal:        General: Normal range of motion.     Cervical back: Normal range of motion and neck supple.  Skin:    General: Skin is warm and dry.  Neurological:     Mental Status: He is alert and oriented to person, place, and time.   Psychiatric:        Mood and Affect: Mood normal.        Behavior: Behavior normal.      No results found for any visits on 12/19/23.  Recent Results (from the past 2160 hours)  Lab report - scanned     Status: None  Collection Time: 11/10/23 12:00 AM  Result Value Ref Range   EGFR 42.0     Comment: Abstracted by HIM   Calcium 9.4    PTH 46       Assessment & Plan: 1) Bilateral cerumen impaction - nursing performed bilateral ear irrigation today in office, ENT referral  2) Allergic rhinitis - fluticasone  NS BID 3) Follow up appt in 2 weeks   Problem List Items Addressed This Visit   None   No follow-ups on file.   Total time spent: 25 minutes  Neale Carpen, NP  12/19/2023   This document may have been prepared by [facility] Voice Recognition software and as such may include unintentional dictation errors.

## 2023-12-19 NOTE — Patient Instructions (Addendum)
 1) Bil cerumen impaction - nursing performed bilateral ear irrigation today in office, ENT referral 2) Allergic rhinitis - fluticasone  NS BID 3) Follow up appt in 2 weeks

## 2023-12-22 ENCOUNTER — Ambulatory Visit: Payer: Self-pay

## 2023-12-22 ENCOUNTER — Telehealth: Payer: Self-pay | Admitting: Nurse Practitioner

## 2023-12-22 NOTE — Telephone Encounter (Signed)
 FYI Only or Action Required?: Action required by provider: clinical question for provider and update on patient condition.  Patient was last seen in primary care on 12/19/2023 by Mark Sand, NP.  Called Nurse Triage reporting Otalgia.  Symptoms began several days ago.  Interventions attempted: OTC medications: Tylenol  and Prescription medications: Flonase .  Symptoms are: gradually worsening.  Triage Disposition: See HCP Within 4 Hours (Or PCP Triage)  Patient/caregiver understands and will follow disposition?: No, refuses disposition                             Copied from CRM #[id number]. Topic: Clinical - Red Word Triage >> Dec 22, 2023 10:34 AM Mark Cross wrote: [facility] that prompted transfer to Nurse Triage: Received cal from patient spouse, Mark Cross, states is having increased right ear pain. Was previously seen by provider on 12/19/23, but ear pain has worsened since seen in office. Reason for Disposition  [1] SEVERE pain (e.g., excruciating) and [2] not improved 2 hours after pain medicine (e.g., acetaminophen  or ibuprofen)  Answer Assessment - Initial Assessment Questions 1. LOCATION: Which ear is involved?     Right ear  2. ONSET: When did the ear pain start?      OV on 12/19/23 and received referral to ENT, states pain has been worsening since OV 3. SEVERITY: How bad is the pain?  (Scale 1-10; mild, moderate or severe)     Moderate-severe 4. URI SYMPTOMS: Do you have a runny nose or cough?     Wife states patient has been trying to clear his throat, prescribed Flonase   5. FEVER: Do you have a fever? If Yes, ask: What is your temperature, how was it measured, and when did it start?     Denies 6. CAUSE: Have you been swimming recently?, How often do you use Q-TIPS?, Have you had any recent air travel or scuba diving?     Impacted ear wax 7. OTHER SYMPTOMS: Do you have any other symptoms? (e.g., decreased hearing, dizziness,  headache, stiff neck, vomiting)     Denies dizziness, denies headache, denies additional symptoms    Advised patient to follow-up in office again, due to worsening pain. Patient declined and stated he wanted to wait it out. Patient would like to ask provider if there are any other remedies such as, sweet oil, he can try. Please advise.  Protocols used: Rilla

## 2023-12-22 NOTE — Telephone Encounter (Signed)
 Copied from CRM #[id number]. Topic: Referral - Status >> Dec 22, 2023 10:27 AM [name] wrote: Reason for CRM: Received call from spouse, [name], [place] verified. Calling to check status of ENT referral.  Per chart review, referral has been placed to, Referring To Provider Information [facility] [address] [place] [address] [phone]  Above information given to spouse, verbalized understanding, spouse is requesting referral to be sent to a provider located in [place], due to distance and is closer to residence.   Requesting to have this referral placed as soon as possible.

## 2023-12-23 NOTE — Telephone Encounter (Signed)
 Returned call to main # listed in Patient's Chart, LM at this time.  Unfortunately there are not ENT Offices located in [place] at this time.

## 2023-12-24 ENCOUNTER — Telehealth: Payer: Self-pay

## 2023-12-24 NOTE — Telephone Encounter (Signed)
 Copied from CRM #[id number]. Topic: Clinical - Red Word Triage >> Dec 22, 2023 10:34 AM [name] wrote: [facility] that prompted transfer to Nurse Triage: Received cal from patient spouse, [name], states is having increased right ear pain. Was previously seen by provider on 12/19/23, but ear pain has worsened since seen in office. >> Dec 24, 2023 11:38 AM Gustabo D wrote: Patient's wife is calling again because his ear is still bothering him.

## 2023-12-25 NOTE — Telephone Encounter (Signed)
 Spoke to wife, patient is doing better today

## 2024-01-22 ENCOUNTER — Encounter (INDEPENDENT_AMBULATORY_CARE_PROVIDER_SITE_OTHER): Payer: Self-pay

## 2024-02-20 ENCOUNTER — Ambulatory Visit: Admitting: Family Medicine

## 2024-02-24 ENCOUNTER — Ambulatory Visit: Admitting: Family Medicine

## 2024-03-03 NOTE — Progress Notes (Signed)
 Mark Cross                                          MRN: [id number]   03/03/2024   The [facility] Quality Team Specialist reviewed this patient medical record for the purposes of chart review for care gap closure. The following were reviewed: chart review for care gap closure-kidney health evaluation for diabetes:eGFR  and uACR.    [facility] Quality Team

## 2024-03-16 ENCOUNTER — Ambulatory Visit

## 2024-04-26 ENCOUNTER — Other Ambulatory Visit: Payer: Self-pay

## 2024-04-26 DIAGNOSIS — I1A0 Resistant hypertension: Secondary | ICD-10-CM

## 2024-05-27 ENCOUNTER — Encounter (HOSPITAL_COMMUNITY): Payer: Self-pay | Admitting: Nephrology

## 2024-06-03 ENCOUNTER — Other Ambulatory Visit (HOSPITAL_BASED_OUTPATIENT_CLINIC_OR_DEPARTMENT_OTHER): Payer: Self-pay | Admitting: Nephrology

## 2024-06-03 DIAGNOSIS — I1A0 Resistant hypertension: Secondary | ICD-10-CM

## 2024-06-07 ENCOUNTER — Other Ambulatory Visit (HOSPITAL_COMMUNITY): Payer: Self-pay | Admitting: Registered Nurse

## 2024-06-07 DIAGNOSIS — I1A0 Resistant hypertension: Secondary | ICD-10-CM

## 2024-06-11 ENCOUNTER — Other Ambulatory Visit (HOSPITAL_BASED_OUTPATIENT_CLINIC_OR_DEPARTMENT_OTHER)

## 2024-06-18 ENCOUNTER — Encounter (HOSPITAL_COMMUNITY): Payer: Self-pay

## 2024-06-18 ENCOUNTER — Ambulatory Visit (HOSPITAL_COMMUNITY): Admission: RE | Admit: 2024-06-18 | Source: Ambulatory Visit

## 7770-07-05 DEATH — deceased
